# Patient Record
Sex: Female | Born: 1945
Health system: Southern US, Community
[De-identification: ages and names within clinical notes are randomized; demographics above are authoritative.]

## PROBLEM LIST (undated history)

## (undated) DIAGNOSIS — I10 Essential (primary) hypertension: Secondary | ICD-10-CM

## (undated) DIAGNOSIS — K219 Gastro-esophageal reflux disease without esophagitis: Secondary | ICD-10-CM

## (undated) DIAGNOSIS — I639 Cerebral infarction, unspecified: Secondary | ICD-10-CM

## (undated) DIAGNOSIS — J449 Chronic obstructive pulmonary disease, unspecified: Secondary | ICD-10-CM

## (undated) DIAGNOSIS — I509 Heart failure, unspecified: Secondary | ICD-10-CM

## (undated) DIAGNOSIS — E7800 Pure hypercholesterolemia, unspecified: Secondary | ICD-10-CM

## (undated) DIAGNOSIS — F411 Generalized anxiety disorder: Secondary | ICD-10-CM

## (undated) DIAGNOSIS — I2510 Atherosclerotic heart disease of native coronary artery without angina pectoris: Secondary | ICD-10-CM

## (undated) HISTORY — DX: Atherosclerotic heart disease of native coronary artery without angina pectoris: I25.10

## (undated) HISTORY — PX: ABDOMINAL HYSTERECTOMY: SHX81

## (undated) HISTORY — PX: BLADDER SURGERY: SHX569

## (undated) HISTORY — DX: Heart failure, unspecified: I50.9

---

## 1998-12-28 ENCOUNTER — Encounter: Admission: RE | Admit: 1998-12-28 | Discharge: 1999-03-28 | Payer: Self-pay | Admitting: Internal Medicine

## 1999-01-08 ENCOUNTER — Encounter: Admission: RE | Admit: 1999-01-08 | Discharge: 1999-01-08 | Payer: Self-pay | Admitting: Internal Medicine

## 2000-01-08 ENCOUNTER — Encounter: Payer: Self-pay | Admitting: Internal Medicine

## 2000-01-08 ENCOUNTER — Encounter: Admission: RE | Admit: 2000-01-08 | Discharge: 2000-01-08 | Payer: Self-pay | Admitting: Internal Medicine

## 2000-07-17 ENCOUNTER — Encounter: Payer: Self-pay | Admitting: Internal Medicine

## 2000-07-17 ENCOUNTER — Encounter: Admission: RE | Admit: 2000-07-17 | Discharge: 2000-07-17 | Payer: Self-pay | Admitting: Internal Medicine

## 2000-10-28 ENCOUNTER — Encounter: Payer: Self-pay | Admitting: Internal Medicine

## 2000-10-28 ENCOUNTER — Encounter: Admission: RE | Admit: 2000-10-28 | Discharge: 2000-10-28 | Payer: Self-pay | Admitting: Internal Medicine

## 2000-11-17 ENCOUNTER — Encounter: Payer: Self-pay | Admitting: Internal Medicine

## 2000-11-17 ENCOUNTER — Encounter: Admission: RE | Admit: 2000-11-17 | Discharge: 2000-11-17 | Payer: Self-pay | Admitting: Internal Medicine

## 2000-11-20 ENCOUNTER — Observation Stay (HOSPITAL_COMMUNITY): Admission: RE | Admit: 2000-11-20 | Discharge: 2000-11-20 | Payer: Self-pay

## 2000-11-20 ENCOUNTER — Encounter (INDEPENDENT_AMBULATORY_CARE_PROVIDER_SITE_OTHER): Payer: Self-pay | Admitting: Specialist

## 2002-06-01 ENCOUNTER — Encounter: Payer: Self-pay | Admitting: Internal Medicine

## 2002-06-01 ENCOUNTER — Encounter: Admission: RE | Admit: 2002-06-01 | Discharge: 2002-06-01 | Payer: Self-pay | Admitting: Internal Medicine

## 2002-11-21 ENCOUNTER — Emergency Department (HOSPITAL_COMMUNITY): Admission: EM | Admit: 2002-11-21 | Discharge: 2002-11-21 | Payer: Self-pay | Admitting: Emergency Medicine

## 2003-02-22 ENCOUNTER — Encounter: Admission: RE | Admit: 2003-02-22 | Discharge: 2003-02-22 | Payer: Self-pay | Admitting: Internal Medicine

## 2003-06-23 ENCOUNTER — Encounter: Admission: RE | Admit: 2003-06-23 | Discharge: 2003-06-23 | Payer: Self-pay | Admitting: Internal Medicine

## 2004-08-31 ENCOUNTER — Encounter: Admission: RE | Admit: 2004-08-31 | Discharge: 2004-08-31 | Payer: Self-pay | Admitting: Internal Medicine

## 2008-07-30 ENCOUNTER — Emergency Department (HOSPITAL_COMMUNITY): Admission: EM | Admit: 2008-07-30 | Discharge: 2008-07-30 | Payer: Self-pay | Admitting: Emergency Medicine

## 2010-02-25 ENCOUNTER — Emergency Department (HOSPITAL_COMMUNITY)
Admission: EM | Admit: 2010-02-25 | Discharge: 2010-02-25 | Payer: Self-pay | Source: Home / Self Care | Admitting: Emergency Medicine

## 2010-04-14 ENCOUNTER — Encounter: Payer: Self-pay | Admitting: Internal Medicine

## 2010-04-15 ENCOUNTER — Encounter: Payer: Self-pay | Admitting: Internal Medicine

## 2010-04-28 ENCOUNTER — Encounter: Payer: Self-pay | Admitting: Internal Medicine

## 2010-07-03 LAB — URINALYSIS, ROUTINE W REFLEX MICROSCOPIC
Glucose, UA: NEGATIVE mg/dL
Ketones, ur: NEGATIVE mg/dL
pH: 7.5 (ref 5.0–8.0)

## 2010-07-03 LAB — POCT CARDIAC MARKERS
CKMB, poc: <1 ng/mL — ABNORMAL LOW (ref 1.0–8.0)
CKMB, poc: <1 ng/mL — ABNORMAL LOW (ref 1.0–8.0)
Troponin i, poc: <0.05 ng/mL (ref 0.00–0.09)

## 2010-07-03 LAB — CBC
HCT: 40.7 % (ref 36.0–46.0)
MCHC: 34.8 g/dL (ref 30.0–36.0)
MCV: 90 fL (ref 78.0–100.0)
Platelets: 229 10*3/uL (ref 150–400)
RDW: 13.1 % (ref 11.5–15.5)

## 2010-07-03 LAB — DIFFERENTIAL
Eosinophils Absolute: 0.1 10*3/uL (ref 0.0–0.7)
Eosinophils Relative: 2 % (ref 0–5)
Lymphs Abs: 2.4 10*3/uL (ref 0.7–4.0)
Monocytes Relative: 9 % (ref 3–12)
Neutrophils Relative %: 56 % (ref 43–77)

## 2010-07-03 LAB — BASIC METABOLIC PANEL
BUN: 4 mg/dL — ABNORMAL LOW (ref 6–23)
CO2: 27 mEq/L (ref 19–32)
Chloride: 105 mEq/L (ref 96–112)
Glucose, Bld: 113 mg/dL — ABNORMAL HIGH (ref 70–99)
Potassium: 2.7 mEq/L — CL (ref 3.5–5.1)

## 2010-08-10 NOTE — Op Note (Signed)
[facility]  Patient:    Melinda Gomez, Melinda Gomez Visit Number: [id number] MRN: [id number]          Service Type: SUR Location: [id number] Attending Physician:  Meredith Leeds Proc. Date: 11/20/00 Adm. Date:  11/20/2000                             Operative Report  PREOPERATIVE DIAGNOSIS:  Symptomatic gallstones  POSTOPERATIVE DIAGNOSIS:  Symptomatic gallstones.  OPERATION:  Laparoscopic cholecystectomy.  SURGEON:  Zigmund Daniel, M.D.  ASSISTANT:  Rose Phi. Maple Hudson, M.D.  ANESTHESIA:  General.  DESCRIPTION OF PROCEDURE:  After the patient had adequate monitoring and general anesthesia and routine preparation and draping of the abdomen, I made a short transverse infraumbilical incision, incised the fascia in the midline and bluntly entered the peritoneal cavity.  The immediate area was free of adhesions but when I put in the scope after securing Hasson cannula with an 0 Vicryl pursestring suture and achieving pneumoperitoneum, there were obvious adhesions on the right side of the abdomen.  I removed the cannula and swept away some of the adhesions and assured that I had free access to the peritoneal cavity.  I carefully re-placed the scope, assuring myself that there were no bowel injuries and then was able to achieve access to the upper abdomen by looking first toward the left and then swinging it around to the right.  There was good access in the right upper quadrant.  The gallbladder did not appear to be inflamed.  I then placed three additional ports after anesthetizing the sites and retracted the fundus of the gallbladder toward the right shoulder and pulled the infundibulum laterally.  With the patient in the head up, foot down, and left tilted position, there was good visibility of the gallbladder.  I then dissected into the hepatoduodenal ligament until I clearly identified the cystic duct emerging from the infundibulum of  the gallbladder, and I clipped it with four clips and divided it between the two clips which were closest to the gallbladder.  I then dissected out the cystic artery and found at least two branches and similarly clipped and divided the vessels.  I dissected the gallbladder from the liver using cautery and gaining hemostasis as I brought the dissection upward.  Before detaching it, I irrigated the gallbladder fossa and saw that the clips were secure and that there was no bleeding or leakage of bile.  We then removed the irrigant and detached the gallbladder from the liver and removed it through the umbilical incision and tied that pursestring suture.  I then removed the lateral ports under direct vision and after releasing the CO2, removed the epigastric port. I closed all skin incisions with intracuticular 4-0 Vicryl and Steri-Strips. The patient tolerated the operation well.  Sponge, needle, and instrument counts were correct. Attending Physician:  Meredith Leeds DD:  11/20/00 TD:  11/20/00 Job: [id number] [id number]

## 2011-03-14 ENCOUNTER — Other Ambulatory Visit: Payer: Self-pay | Admitting: Internal Medicine

## 2011-03-14 DIAGNOSIS — Z1231 Encounter for screening mammogram for malignant neoplasm of breast: Secondary | ICD-10-CM

## 2011-04-05 ENCOUNTER — Ambulatory Visit: Payer: Self-pay

## 2012-03-24 ENCOUNTER — Other Ambulatory Visit: Payer: Self-pay | Admitting: Internal Medicine

## 2012-03-24 DIAGNOSIS — Z1231 Encounter for screening mammogram for malignant neoplasm of breast: Secondary | ICD-10-CM

## 2012-04-24 ENCOUNTER — Ambulatory Visit: Payer: Self-pay

## 2012-05-21 ENCOUNTER — Ambulatory Visit: Payer: Self-pay

## 2013-07-28 ENCOUNTER — Ambulatory Visit: Payer: Medicare Other | Attending: Physician Assistant

## 2013-07-28 DIAGNOSIS — R498 Other voice and resonance disorders: Secondary | ICD-10-CM | POA: Insufficient documentation

## 2013-07-28 DIAGNOSIS — IMO0001 Reserved for inherently not codable concepts without codable children: Secondary | ICD-10-CM | POA: Insufficient documentation

## 2013-08-02 ENCOUNTER — Ambulatory Visit: Payer: Medicare Other

## 2015-04-06 DIAGNOSIS — E782 Mixed hyperlipidemia: Secondary | ICD-10-CM | POA: Diagnosis not present

## 2015-04-06 DIAGNOSIS — W19XXXA Unspecified fall, initial encounter: Secondary | ICD-10-CM | POA: Diagnosis not present

## 2015-04-06 DIAGNOSIS — I10 Essential (primary) hypertension: Secondary | ICD-10-CM | POA: Diagnosis not present

## 2015-04-06 DIAGNOSIS — K219 Gastro-esophageal reflux disease without esophagitis: Secondary | ICD-10-CM | POA: Diagnosis not present

## 2015-04-06 DIAGNOSIS — Z5181 Encounter for therapeutic drug level monitoring: Secondary | ICD-10-CM | POA: Diagnosis not present

## 2015-04-06 DIAGNOSIS — Z79899 Other long term (current) drug therapy: Secondary | ICD-10-CM | POA: Diagnosis not present

## 2015-04-06 DIAGNOSIS — R7301 Impaired fasting glucose: Secondary | ICD-10-CM | POA: Diagnosis not present

## 2015-04-06 DIAGNOSIS — R251 Tremor, unspecified: Secondary | ICD-10-CM | POA: Diagnosis not present

## 2015-05-11 DIAGNOSIS — F4310 Post-traumatic stress disorder, unspecified: Secondary | ICD-10-CM | POA: Diagnosis not present

## 2015-05-15 DIAGNOSIS — G43909 Migraine, unspecified, not intractable, without status migrainosus: Secondary | ICD-10-CM | POA: Diagnosis not present

## 2015-05-15 DIAGNOSIS — I10 Essential (primary) hypertension: Secondary | ICD-10-CM | POA: Diagnosis not present

## 2015-05-15 DIAGNOSIS — E871 Hypo-osmolality and hyponatremia: Secondary | ICD-10-CM | POA: Diagnosis not present

## 2015-07-10 DIAGNOSIS — E538 Deficiency of other specified B group vitamins: Secondary | ICD-10-CM | POA: Diagnosis not present

## 2015-07-10 DIAGNOSIS — I10 Essential (primary) hypertension: Secondary | ICD-10-CM | POA: Diagnosis not present

## 2015-07-10 DIAGNOSIS — W19XXXA Unspecified fall, initial encounter: Secondary | ICD-10-CM | POA: Diagnosis not present

## 2015-10-31 DIAGNOSIS — I10 Essential (primary) hypertension: Secondary | ICD-10-CM | POA: Diagnosis not present

## 2015-10-31 DIAGNOSIS — K219 Gastro-esophageal reflux disease without esophagitis: Secondary | ICD-10-CM | POA: Diagnosis not present

## 2015-11-13 DIAGNOSIS — F4310 Post-traumatic stress disorder, unspecified: Secondary | ICD-10-CM | POA: Diagnosis not present

## 2016-02-06 DIAGNOSIS — F4310 Post-traumatic stress disorder, unspecified: Secondary | ICD-10-CM | POA: Diagnosis not present

## 2016-03-26 DIAGNOSIS — L708 Other acne: Secondary | ICD-10-CM | POA: Diagnosis not present

## 2016-03-26 DIAGNOSIS — L570 Actinic keratosis: Secondary | ICD-10-CM | POA: Diagnosis not present

## 2016-04-18 ENCOUNTER — Other Ambulatory Visit: Payer: Self-pay | Admitting: Internal Medicine

## 2016-04-18 ENCOUNTER — Ambulatory Visit
Admission: RE | Admit: 2016-04-18 | Discharge: 2016-04-18 | Disposition: A | Discharge status: Home / Self Care | Payer: PPO | Source: Ambulatory Visit | Attending: Internal Medicine | Admitting: Internal Medicine

## 2016-04-18 DIAGNOSIS — F4000 Agoraphobia, unspecified: Secondary | ICD-10-CM | POA: Diagnosis not present

## 2016-04-18 DIAGNOSIS — M25531 Pain in right wrist: Secondary | ICD-10-CM | POA: Diagnosis not present

## 2016-04-18 DIAGNOSIS — E782 Mixed hyperlipidemia: Secondary | ICD-10-CM | POA: Diagnosis not present

## 2016-04-18 DIAGNOSIS — M25532 Pain in left wrist: Secondary | ICD-10-CM | POA: Diagnosis not present

## 2016-04-18 DIAGNOSIS — Z1211 Encounter for screening for malignant neoplasm of colon: Secondary | ICD-10-CM | POA: Diagnosis not present

## 2016-04-18 DIAGNOSIS — Z0000 Encounter for general adult medical examination without abnormal findings: Secondary | ICD-10-CM | POA: Diagnosis not present

## 2016-04-18 DIAGNOSIS — I10 Essential (primary) hypertension: Secondary | ICD-10-CM | POA: Diagnosis not present

## 2016-04-18 DIAGNOSIS — R7301 Impaired fasting glucose: Secondary | ICD-10-CM | POA: Diagnosis not present

## 2016-04-18 DIAGNOSIS — Z1231 Encounter for screening mammogram for malignant neoplasm of breast: Secondary | ICD-10-CM | POA: Diagnosis not present

## 2016-04-18 DIAGNOSIS — F325 Major depressive disorder, single episode, in full remission: Secondary | ICD-10-CM | POA: Diagnosis not present

## 2016-04-18 DIAGNOSIS — M19032 Primary osteoarthritis, left wrist: Secondary | ICD-10-CM | POA: Diagnosis not present

## 2016-04-19 DIAGNOSIS — I10 Essential (primary) hypertension: Secondary | ICD-10-CM | POA: Diagnosis not present

## 2016-04-30 DIAGNOSIS — F4310 Post-traumatic stress disorder, unspecified: Secondary | ICD-10-CM | POA: Diagnosis not present

## 2016-05-28 ENCOUNTER — Ambulatory Visit: Payer: Self-pay

## 2016-06-14 ENCOUNTER — Ambulatory Visit
Admission: RE | Admit: 2016-06-14 | Discharge: 2016-06-14 | Disposition: A | Discharge status: Home / Self Care | Payer: PPO | Source: Ambulatory Visit | Attending: Internal Medicine | Admitting: Internal Medicine

## 2016-06-14 DIAGNOSIS — Z1231 Encounter for screening mammogram for malignant neoplasm of breast: Secondary | ICD-10-CM

## 2016-06-25 DIAGNOSIS — F4310 Post-traumatic stress disorder, unspecified: Secondary | ICD-10-CM | POA: Diagnosis not present

## 2016-07-31 DIAGNOSIS — K5730 Diverticulosis of large intestine without perforation or abscess without bleeding: Secondary | ICD-10-CM | POA: Diagnosis not present

## 2016-07-31 DIAGNOSIS — Z1211 Encounter for screening for malignant neoplasm of colon: Secondary | ICD-10-CM | POA: Diagnosis not present

## 2016-07-31 DIAGNOSIS — K635 Polyp of colon: Secondary | ICD-10-CM | POA: Diagnosis not present

## 2016-07-31 DIAGNOSIS — D126 Benign neoplasm of colon, unspecified: Secondary | ICD-10-CM | POA: Diagnosis not present

## 2016-08-06 DIAGNOSIS — Z1211 Encounter for screening for malignant neoplasm of colon: Secondary | ICD-10-CM | POA: Diagnosis not present

## 2016-08-06 DIAGNOSIS — K635 Polyp of colon: Secondary | ICD-10-CM | POA: Diagnosis not present

## 2016-08-06 DIAGNOSIS — D126 Benign neoplasm of colon, unspecified: Secondary | ICD-10-CM | POA: Diagnosis not present

## 2016-10-01 DIAGNOSIS — F4310 Post-traumatic stress disorder, unspecified: Secondary | ICD-10-CM | POA: Diagnosis not present

## 2016-10-18 DIAGNOSIS — I10 Essential (primary) hypertension: Secondary | ICD-10-CM | POA: Diagnosis not present

## 2016-10-18 DIAGNOSIS — K219 Gastro-esophageal reflux disease without esophagitis: Secondary | ICD-10-CM | POA: Diagnosis not present

## 2017-04-24 DIAGNOSIS — R0609 Other forms of dyspnea: Secondary | ICD-10-CM | POA: Diagnosis not present

## 2017-04-24 DIAGNOSIS — K219 Gastro-esophageal reflux disease without esophagitis: Secondary | ICD-10-CM | POA: Diagnosis not present

## 2017-04-24 DIAGNOSIS — F325 Major depressive disorder, single episode, in full remission: Secondary | ICD-10-CM | POA: Diagnosis not present

## 2017-04-24 DIAGNOSIS — I10 Essential (primary) hypertension: Secondary | ICD-10-CM | POA: Diagnosis not present

## 2017-04-24 DIAGNOSIS — Z1389 Encounter for screening for other disorder: Secondary | ICD-10-CM | POA: Diagnosis not present

## 2017-04-24 DIAGNOSIS — E663 Overweight: Secondary | ICD-10-CM | POA: Diagnosis not present

## 2017-04-24 DIAGNOSIS — E782 Mixed hyperlipidemia: Secondary | ICD-10-CM | POA: Diagnosis not present

## 2017-04-24 DIAGNOSIS — Z0000 Encounter for general adult medical examination without abnormal findings: Secondary | ICD-10-CM | POA: Diagnosis not present

## 2017-04-24 DIAGNOSIS — Z720 Tobacco use: Secondary | ICD-10-CM | POA: Diagnosis not present

## 2017-04-24 DIAGNOSIS — R7301 Impaired fasting glucose: Secondary | ICD-10-CM | POA: Diagnosis not present

## 2017-04-24 DIAGNOSIS — L659 Nonscarring hair loss, unspecified: Secondary | ICD-10-CM | POA: Diagnosis not present

## 2017-04-25 DIAGNOSIS — N179 Acute kidney failure, unspecified: Secondary | ICD-10-CM | POA: Diagnosis not present

## 2017-04-25 DIAGNOSIS — I10 Essential (primary) hypertension: Secondary | ICD-10-CM | POA: Diagnosis not present

## 2017-05-14 DIAGNOSIS — N179 Acute kidney failure, unspecified: Secondary | ICD-10-CM | POA: Diagnosis not present

## 2017-05-19 DIAGNOSIS — F325 Major depressive disorder, single episode, in full remission: Secondary | ICD-10-CM | POA: Diagnosis not present

## 2017-05-19 DIAGNOSIS — E782 Mixed hyperlipidemia: Secondary | ICD-10-CM | POA: Diagnosis not present

## 2017-05-19 DIAGNOSIS — I10 Essential (primary) hypertension: Secondary | ICD-10-CM | POA: Diagnosis not present

## 2017-05-22 DIAGNOSIS — F4310 Post-traumatic stress disorder, unspecified: Secondary | ICD-10-CM | POA: Diagnosis not present

## 2017-06-02 DIAGNOSIS — R0609 Other forms of dyspnea: Secondary | ICD-10-CM | POA: Diagnosis not present

## 2017-08-12 DIAGNOSIS — F4310 Post-traumatic stress disorder, unspecified: Secondary | ICD-10-CM | POA: Diagnosis not present

## 2017-10-16 DIAGNOSIS — F325 Major depressive disorder, single episode, in full remission: Secondary | ICD-10-CM | POA: Diagnosis not present

## 2017-10-16 DIAGNOSIS — I10 Essential (primary) hypertension: Secondary | ICD-10-CM | POA: Diagnosis not present

## 2017-10-16 DIAGNOSIS — J449 Chronic obstructive pulmonary disease, unspecified: Secondary | ICD-10-CM | POA: Diagnosis not present

## 2017-10-16 DIAGNOSIS — E782 Mixed hyperlipidemia: Secondary | ICD-10-CM | POA: Diagnosis not present

## 2017-10-21 DIAGNOSIS — F4310 Post-traumatic stress disorder, unspecified: Secondary | ICD-10-CM | POA: Diagnosis not present

## 2017-10-23 DIAGNOSIS — Z720 Tobacco use: Secondary | ICD-10-CM | POA: Diagnosis not present

## 2017-10-23 DIAGNOSIS — I10 Essential (primary) hypertension: Secondary | ICD-10-CM | POA: Diagnosis not present

## 2017-10-23 DIAGNOSIS — K219 Gastro-esophageal reflux disease without esophagitis: Secondary | ICD-10-CM | POA: Diagnosis not present

## 2017-10-23 DIAGNOSIS — J449 Chronic obstructive pulmonary disease, unspecified: Secondary | ICD-10-CM | POA: Diagnosis not present

## 2017-12-03 DIAGNOSIS — J449 Chronic obstructive pulmonary disease, unspecified: Secondary | ICD-10-CM | POA: Diagnosis not present

## 2017-12-03 DIAGNOSIS — F329 Major depressive disorder, single episode, unspecified: Secondary | ICD-10-CM | POA: Diagnosis not present

## 2017-12-03 DIAGNOSIS — I10 Essential (primary) hypertension: Secondary | ICD-10-CM | POA: Diagnosis not present

## 2017-12-03 DIAGNOSIS — E782 Mixed hyperlipidemia: Secondary | ICD-10-CM | POA: Diagnosis not present

## 2018-01-02 DIAGNOSIS — F4310 Post-traumatic stress disorder, unspecified: Secondary | ICD-10-CM

## 2018-01-02 DIAGNOSIS — F5104 Psychophysiologic insomnia: Secondary | ICD-10-CM

## 2018-01-02 DIAGNOSIS — K219 Gastro-esophageal reflux disease without esophagitis: Secondary | ICD-10-CM

## 2018-01-13 ENCOUNTER — Encounter: Payer: Self-pay | Admitting: Psychiatry

## 2018-01-13 ENCOUNTER — Ambulatory Visit (INDEPENDENT_AMBULATORY_CARE_PROVIDER_SITE_OTHER): Payer: PPO | Admitting: Psychiatry

## 2018-01-13 DIAGNOSIS — F331 Major depressive disorder, recurrent, moderate: Secondary | ICD-10-CM | POA: Diagnosis not present

## 2018-01-13 DIAGNOSIS — F4310 Post-traumatic stress disorder, unspecified: Secondary | ICD-10-CM

## 2018-01-13 DIAGNOSIS — G4720 Circadian rhythm sleep disorder, unspecified type: Secondary | ICD-10-CM

## 2018-01-13 NOTE — Patient Instructions (Signed)
Continue blue blocker glasses. Try to stop daytime napping to help improve nighttime sleep. Get early day light exposure.

## 2018-01-13 NOTE — Progress Notes (Signed)
[facility]  Patient ID: HYDEE FLEECE,  MRN: [id number]  PCP: Patient, No Pcp Per  Date of Evaluation: 01/13/2018 Time spent:30 minutes   HISTORY/CURRENT STATUS: HPI  Seen with H CC: insomnia same as last time.  Tried 10 and 15 mg samples of Belsomra inconsistently.  I don't see a difference except has a hangover.  Still EMA and EFA.  She says rarely sleeps all night.  Frustrated with that.  Go to bed 9 PM. Avg 5 hrs by her report and tired daytime.  Will nap daytime bc poor sleep at night.  H thinks she makes up for insomnia at HS by naps.  Few NM. Patient reports stable mood and denies depressed or irritable moods.  Patient denies any recent difficulty with anxiety.   Denies appetite disturbance.  Patient reports that energy and motivation have been low.  Patient denies any difficulty with concentration.  Patient denies any suicidal ideation . Individual Medical History/ Review of Systems: Changes? :No  Sleeping medications that have failed include mirtazapine, hydroxyzine, trazodone.  Inconsistent trial of Belsomra.  Allergies: Talwin [pentazocine]  Current Medications:  Current Outpatient Medications:  .  ALPRAZolam (XANAX) 0.5 MG tablet, Take 0.5 mg by mouth 3 (three) times daily as needed for anxiety (take 1.5 mg as needed for anxiety)., Disp: , Rfl:  .  hydrOXYzine (ATARAX/VISTARIL) 25 MG tablet, Take 25 mg by mouth at bedtime., Disp: , Rfl:  .  risperiDONE (RISPERDAL) 1 MG tablet, Take 1 tablet by mouth 2 (two) times daily as needed., Disp: , Rfl:  .  traZODone (DESYREL) 50 MG tablet, Take 50 mg by mouth at bedtime as needed for sleep (take 1-2 at bedtime as needed)., Disp: , Rfl:  .  venlafaxine XR (EFFEXOR-XR) 75 MG 24 hr capsule, Take 75 mg by mouth daily. Take 1 (75 mg) daily & 2 (150 mg) at bedtime. 225 mg total, Disp: , Rfl:  Medication Side Effects: None  Family Medical/ Social History: Changes? No  MENTAL HEALTH EXAM:  There were no vitals taken  for this visit.There is no height or weight on file to calculate BMI.  General Appearance: Casual  Eye Contact:  Good  Speech:  Slow  Volume:  Normal  Mood:  Dysphoric  Affect:  Appropriate and mild irritabilty  Thought Process:  Coherent  Orientation:  Full (Time, Place, and Person)  Thought Content: Logical   Suicidal Thoughts:  No  Homicidal Thoughts:  No  Memory:  Recent  Judgement:  Fair  Insight:  Fair  Psychomotor Activity:  Decreased  Concentration:  Concentration: Fair  Recall:  Good  Fund of Knowledge: Fair  Language: Good  Akathisia:  No  AIMS (if indicated): not done  Assets:  Financial Resources/Insurance Housing Social Support Transportation  ADL's:  Intact  Cognition: WNL  Prognosis:  Fair    DIAGNOSES:    ICD-10-CM   1. Disturbed sleep rhythm G47.20   2. PTSD (post-traumatic stress disorder) F43.10   3. Major depressive disorder, recurrent episode, moderate (HCC) F33.1     RECOMMENDATIONS:   Greater than 50% of face to face time with patient was spent on counseling and coordination of care. We discussed sleep hygiene and esp sleep restriction.  We discussed the fact that we have used the safest sleep meds at this time.  There is cognitive risks if we continue to escalate them or continue to combine more sleep meds together.  Were not going to achieve success at consolidating sleep at night  if she naps significantly during the day. Don't take sleep meds if NR. Continue blue blocker glasses. Try to stop daytime napping to help improve nighttime sleep. Get early day light exposure. The alternative of Seroquel raises additional side effect concerns and we will not pursue that at this time.  She could consider a longer trial of Belsomra at 10 or 15 mg but she has problems with consistency on this.   Purnell Shoemaker, MD

## 2018-01-19 DIAGNOSIS — M79651 Pain in right thigh: Secondary | ICD-10-CM | POA: Diagnosis not present

## 2018-02-02 ENCOUNTER — Other Ambulatory Visit: Payer: Self-pay | Admitting: Psychiatry

## 2018-02-02 NOTE — Telephone Encounter (Signed)
Clarify dose with paper chart

## 2018-02-03 DIAGNOSIS — M25551 Pain in right hip: Secondary | ICD-10-CM | POA: Diagnosis not present

## 2018-02-13 ENCOUNTER — Other Ambulatory Visit: Payer: Self-pay | Admitting: Psychiatry

## 2018-03-02 ENCOUNTER — Other Ambulatory Visit: Payer: Self-pay

## 2018-03-02 DIAGNOSIS — E782 Mixed hyperlipidemia: Secondary | ICD-10-CM | POA: Diagnosis not present

## 2018-03-02 DIAGNOSIS — J449 Chronic obstructive pulmonary disease, unspecified: Secondary | ICD-10-CM | POA: Diagnosis not present

## 2018-03-02 DIAGNOSIS — F325 Major depressive disorder, single episode, in full remission: Secondary | ICD-10-CM | POA: Diagnosis not present

## 2018-03-02 DIAGNOSIS — I10 Essential (primary) hypertension: Secondary | ICD-10-CM | POA: Diagnosis not present

## 2018-03-02 DIAGNOSIS — F329 Major depressive disorder, single episode, unspecified: Secondary | ICD-10-CM | POA: Diagnosis not present

## 2018-03-02 MED ORDER — ALPRAZOLAM 0.5 MG PO TABS: 0.5000 mg | ORAL_TABLET | Freq: Three times a day (TID) | ORAL | 5 refills | Status: DC | PRN

## 2018-04-28 ENCOUNTER — Other Ambulatory Visit: Payer: Self-pay | Admitting: Internal Medicine

## 2018-04-28 DIAGNOSIS — G4700 Insomnia, unspecified: Secondary | ICD-10-CM | POA: Diagnosis not present

## 2018-04-28 DIAGNOSIS — Z720 Tobacco use: Secondary | ICD-10-CM | POA: Diagnosis not present

## 2018-04-28 DIAGNOSIS — Z1231 Encounter for screening mammogram for malignant neoplasm of breast: Secondary | ICD-10-CM

## 2018-04-28 DIAGNOSIS — R7301 Impaired fasting glucose: Secondary | ICD-10-CM | POA: Diagnosis not present

## 2018-04-28 DIAGNOSIS — F325 Major depressive disorder, single episode, in full remission: Secondary | ICD-10-CM | POA: Diagnosis not present

## 2018-04-28 DIAGNOSIS — Z0000 Encounter for general adult medical examination without abnormal findings: Secondary | ICD-10-CM | POA: Diagnosis not present

## 2018-04-28 DIAGNOSIS — E782 Mixed hyperlipidemia: Secondary | ICD-10-CM | POA: Diagnosis not present

## 2018-04-28 DIAGNOSIS — R911 Solitary pulmonary nodule: Secondary | ICD-10-CM | POA: Diagnosis not present

## 2018-04-28 DIAGNOSIS — Z1389 Encounter for screening for other disorder: Secondary | ICD-10-CM | POA: Diagnosis not present

## 2018-04-28 DIAGNOSIS — I10 Essential (primary) hypertension: Secondary | ICD-10-CM | POA: Diagnosis not present

## 2018-05-22 DIAGNOSIS — F329 Major depressive disorder, single episode, unspecified: Secondary | ICD-10-CM | POA: Diagnosis not present

## 2018-05-22 DIAGNOSIS — I10 Essential (primary) hypertension: Secondary | ICD-10-CM | POA: Diagnosis not present

## 2018-05-22 DIAGNOSIS — E782 Mixed hyperlipidemia: Secondary | ICD-10-CM | POA: Diagnosis not present

## 2018-05-22 DIAGNOSIS — J449 Chronic obstructive pulmonary disease, unspecified: Secondary | ICD-10-CM | POA: Diagnosis not present

## 2018-05-22 DIAGNOSIS — F325 Major depressive disorder, single episode, in full remission: Secondary | ICD-10-CM | POA: Diagnosis not present

## 2018-05-26 ENCOUNTER — Ambulatory Visit
Admission: RE | Admit: 2018-05-26 | Discharge: 2018-05-26 | Disposition: A | Discharge status: Home / Self Care | Payer: PRIVATE HEALTH INSURANCE | Source: Ambulatory Visit | Attending: Internal Medicine | Admitting: Internal Medicine

## 2018-05-26 DIAGNOSIS — Z1231 Encounter for screening mammogram for malignant neoplasm of breast: Secondary | ICD-10-CM

## 2018-06-08 ENCOUNTER — Other Ambulatory Visit: Payer: Self-pay | Admitting: Psychiatry

## 2018-06-08 NOTE — Telephone Encounter (Signed)
Need to review paper chart  

## 2018-07-15 ENCOUNTER — Other Ambulatory Visit: Payer: Self-pay

## 2018-07-15 ENCOUNTER — Ambulatory Visit (INDEPENDENT_AMBULATORY_CARE_PROVIDER_SITE_OTHER): Payer: PPO | Admitting: Psychiatry

## 2018-07-15 ENCOUNTER — Encounter: Payer: Self-pay | Admitting: Psychiatry

## 2018-07-15 DIAGNOSIS — F331 Major depressive disorder, recurrent, moderate: Secondary | ICD-10-CM | POA: Diagnosis not present

## 2018-07-15 DIAGNOSIS — F5105 Insomnia due to other mental disorder: Secondary | ICD-10-CM | POA: Diagnosis not present

## 2018-07-15 DIAGNOSIS — F4310 Post-traumatic stress disorder, unspecified: Secondary | ICD-10-CM | POA: Diagnosis not present

## 2018-07-15 NOTE — Progress Notes (Signed)
MADDUX FIRST [id number] [birth year] 73 y.o.  Subjective:   Patient ID:  Melinda Gomez is a 73 y.o. (DOB [birth year]) female.  Virtual Visit via Telephone Note  I connected with pt by telephone and verified that I am speaking with the correct person using two identifiers.   I discussed the limitations, risks, security and privacy concerns of performing an evaluation and management service by telephone and the availability of in person appointments. I also discussed with the patient that there may be a patient responsible charge related to this service. The patient expressed understanding and agreed to proceed.  I discussed the assessment and treatment plan with the patient. The patient was provided an opportunity to ask questions and all were answered. The patient agreed with the plan and demonstrated an understanding of the instructions.   The patient was advised to call back or seek an in-person evaluation if the symptoms worsen or if the condition fails to improve as anticipated.  I provided 21 minutes of non-face-to-face time during this encounter. The call started 1210 and ended at 1231. The patient was located at home and the provider was located at office.   Chief Complaint:  Chief Complaint  Patient presents with  . Follow-up    Medication Management  . Other    Not sleeping longer than 3 hours at a time at night    HPI Melinda Gomez is followed up for chronic depression and anxiety and insomnia.  Last visit was January 13, 2018 .  Discouraged excessive use of sleep meds.  Still not sleeping good.  EMA after 2-3 hours once weekly.  Usual is 4 hours sleep HS.  No naps usually.  At night takes trazodone 150, hydroxyzine 25, no alprazolam at night.  Not drowsy.  Calm and [name] agrees.  Usual Xanax prn anxiety not set time.  Not taken daily.  Otherwise fine without depression, anxiety and anger.  Patient reports stable mood and denies depressed or irritable moods.  Patient  denies any recent difficulty with anxiety.  Patient denies difficulty with sleep initiation but poor maintenance. Denies appetite disturbance.  Patient reports that energy and motivation have been good.  Patient denies any difficulty with concentration.  Patient denies any suicidal ideation.  Past Psychiatric Medication Trials: Trazodone up to 150, alprazolam, hydroxyzine, mirtazapine 7.5 jerks, risperidone, venlafaxine, buspirone, Paxil 60 to 80 mg, Wellbutrin side effects, Prozac, Belsomra hangover.  Review of Systems:  Review of Systems  Neurological: Positive for weakness. Negative for tremors.       Recent 2 falls at night.  Not always at night.  Has talked to Doctor about falls but no known diagnosis for it.  Not dizzy before falls. Can't get herself up when she falls.  Medications: I have reviewed the patient's current medications.  Current Outpatient Medications  Medication Sig Dispense Refill  . ALPRAZolam (XANAX) 0.5 MG tablet Take 1 tablet (0.5 mg total) by mouth 3 (three) times daily as needed for anxiety (take 1.5 mg as needed for anxiety). 90 tablet 5  . amLODipine (NORVASC) 5 MG tablet     . ANORO ELLIPTA 62.5-25 MCG/INH AEPB     . esomeprazole (NEXIUM) 40 MG capsule     . HYDROcodone-acetaminophen (NORCO/VICODIN) 5-325 MG tablet     . hydrOXYzine (ATARAX/VISTARIL) 25 MG tablet Take 25 mg by mouth at bedtime.    Marland Kitchen losartan (COZAAR) 100 MG tablet     . meloxicam (MOBIC) 15 MG tablet     .  risperiDONE (RISPERDAL) 1 MG tablet TAKE 1/2 TABLET IN THE MORNING AND 1&1/2 TABLETS AT BEDTIME FOR SLEEPAND MOOD. (Patient taking differently: 1 mg at bedtime. ) 60 tablet 1  . rosuvastatin (CRESTOR) 20 MG tablet     . traZODone (DESYREL) 50 MG tablet TAKE 2 TO 3 TABLETS AT BEDTIME AS NEEDED FOR SLEEP. 90 tablet 5  . venlafaxine XR (EFFEXOR-XR) 75 MG 24 hr capsule TAKE 1 CAPSULE DURING THE DAY AND 2 CAPSULES AT BEDTIME FOR DEPRESSION. 270 capsule 1   No current facility-administered  medications for this visit.     Medication Side Effects: None  No evidence for falls related to meds.  No pattern to falls. Allergies:  Allergies  Allergen Reactions  . Talwin [Pentazocine] Other (See Comments)    hallucinations    History reviewed. No pertinent past medical history.  History reviewed. No pertinent family history.  Social History   Socioeconomic History  . Marital status: Married    Spouse name: Not on file  . Number of children: Not on file  . Years of education: Not on file  . Highest education level: Not on file  Occupational History  . Not on file  Social Needs  . Financial resource strain: Not on file  . Food insecurity:    Worry: Not on file    Inability: Not on file  . Transportation needs:    Medical: Not on file    Non-medical: Not on file  Tobacco Use  . Smoking status: Current Every Day Smoker    Types: Cigarettes  . Smokeless tobacco: Never Used  Substance and Sexual Activity  . Alcohol use: Not on file  . Drug use: Not on file  . Sexual activity: Not on file  Lifestyle  . Physical activity:    Days per week: Not on file    Minutes per session: Not on file  . Stress: Not on file  Relationships  . Social connections:    Talks on phone: Not on file    Gets together: Not on file    Attends religious service: Not on file    Active member of club or organization: Not on file    Attends meetings of clubs or organizations: Not on file    Relationship status: Not on file  . Intimate partner violence:    Fear of current or ex partner: Not on file    Emotionally abused: Not on file    Physically abused: Not on file    Forced sexual activity: Not on file  Other Topics Concern  . Not on file  Social History Narrative  . Not on file    Past Medical History, Surgical history, Social history, and Family history were reviewed and updated as appropriate.   Please see review of systems for further details on the patient's review from  today.   Objective:   Physical Exam:  There were no vitals taken for this visit.  Physical Exam Neurological:     Mental Status: She is alert and oriented to person, place, and time.     Cranial Nerves: No dysarthria.  Psychiatric:        Attention and Perception: Attention normal.        Mood and Affect: Mood normal.        Speech: Speech normal.        Behavior: Behavior is cooperative.        Thought Content: Thought content normal. Thought content is not paranoid or delusional. Thought  content does not include homicidal or suicidal ideation. Thought content does not include homicidal or suicidal plan.        Cognition and Memory: Cognition and memory normal.     Comments: Insight and judgment fair.     Lab Review:     Component Value Date/Time   NA 141 07/30/2008 0945   K (LL) 07/30/2008 0945    2.7 REPEATED TO VERIFY CRITICAL RESULT CALLED TO, READ BACK BY AND VERIFIED WITH: PATE K,RN 1115 07/30/08 SCALES H   CL 105 07/30/2008 0945   CO2 27 07/30/2008 0945   GLUCOSE 113 (H) 07/30/2008 0945   BUN 4 (L) 07/30/2008 0945   CREATININE 0.69 07/30/2008 0945   CALCIUM 8.9 07/30/2008 0945   GFRNONAA >60 07/30/2008 0945   GFRAA  07/30/2008 0945    >60        The eGFR has been calculated using the MDRD equation. This calculation has not been validated in all clinical situations. eGFR's persistently <60 mL/min signify possible Chronic Kidney Disease.       Component Value Date/Time   WBC 7.1 07/30/2008 0945   RBC 4.52 07/30/2008 0945   HGB 14.2 07/30/2008 0945   HCT 40.7 07/30/2008 0945   PLT 229 07/30/2008 0945   MCV 90.0 07/30/2008 0945   MCHC 34.8 07/30/2008 0945   RDW 13.1 07/30/2008 0945   LYMPHSABS 2.4 07/30/2008 0945   MONOABS 0.6 07/30/2008 0945   EOSABS 0.1 07/30/2008 0945   BASOSABS 0.0 07/30/2008 0945    No results found for: POCLITH, LITHIUM   No results found for: PHENYTOIN, PHENOBARB, VALPROATE, CBMZ   .res Assessment: Plan:    Insomnia due  to mental condition  PTSD (post-traumatic stress disorder)  Major depressive disorder, recurrent episode, moderate (HCC)   Sleep hygiene in detail.  She is not getting enough sleep but she does not report being drowsy during the day.  We could consider more aggressive medications for sleep.  We will avoid benzodiazepines at night for sleep because she has a high risk of tolerance with those.  She gets some initial benefit from the current medications.  We discussed the fall risk related to any sedative medication.  It does not appear that her falling is related to the medication.  Option Seroquel for TR insomnia but greater SE risk.  She prefers to avoid this after discussing the side effects.  Overall her depression and anxiety associated with PTSD are fairly managed.  She has a fairly restricted life which is how in part she manages her anxiety but it does not contribute to depression so she satisfied.  She does not want medicine change today.  No medicine changes today  Follow-up 6 months  Lynder Parents, MD, DFAPA    Please see After Visit Summary for patient specific instructions.  No future appointments.  No orders of the defined types were placed in this encounter.     -------------------------------

## 2018-08-13 ENCOUNTER — Other Ambulatory Visit: Payer: Self-pay | Admitting: Psychiatry

## 2018-08-18 ENCOUNTER — Telehealth: Payer: Self-pay | Admitting: Psychiatry

## 2018-08-18 ENCOUNTER — Other Ambulatory Visit: Payer: Self-pay | Admitting: Psychiatry

## 2018-08-18 NOTE — Telephone Encounter (Signed)
This was submitted 05/22 with 5 refills

## 2018-08-18 NOTE — Telephone Encounter (Signed)
Patient need refill on Trazadone 50 mg tabs to be sent to [facility] in [place]

## 2018-09-17 ENCOUNTER — Other Ambulatory Visit: Payer: Self-pay | Admitting: Psychiatry

## 2018-10-23 DIAGNOSIS — F329 Major depressive disorder, single episode, unspecified: Secondary | ICD-10-CM | POA: Diagnosis not present

## 2018-10-23 DIAGNOSIS — I10 Essential (primary) hypertension: Secondary | ICD-10-CM | POA: Diagnosis not present

## 2018-10-23 DIAGNOSIS — F325 Major depressive disorder, single episode, in full remission: Secondary | ICD-10-CM | POA: Diagnosis not present

## 2018-10-23 DIAGNOSIS — J449 Chronic obstructive pulmonary disease, unspecified: Secondary | ICD-10-CM | POA: Diagnosis not present

## 2018-10-23 DIAGNOSIS — E782 Mixed hyperlipidemia: Secondary | ICD-10-CM | POA: Diagnosis not present

## 2018-10-26 DIAGNOSIS — I10 Essential (primary) hypertension: Secondary | ICD-10-CM | POA: Diagnosis not present

## 2018-10-26 DIAGNOSIS — K219 Gastro-esophageal reflux disease without esophagitis: Secondary | ICD-10-CM | POA: Diagnosis not present

## 2018-10-26 DIAGNOSIS — J449 Chronic obstructive pulmonary disease, unspecified: Secondary | ICD-10-CM | POA: Diagnosis not present

## 2018-10-26 DIAGNOSIS — G4700 Insomnia, unspecified: Secondary | ICD-10-CM | POA: Diagnosis not present

## 2018-10-26 DIAGNOSIS — Z720 Tobacco use: Secondary | ICD-10-CM | POA: Diagnosis not present

## 2018-11-02 ENCOUNTER — Other Ambulatory Visit: Payer: Self-pay | Admitting: Psychiatry

## 2018-12-22 ENCOUNTER — Other Ambulatory Visit: Payer: Self-pay | Admitting: Psychiatry

## 2019-01-06 DIAGNOSIS — L570 Actinic keratosis: Secondary | ICD-10-CM | POA: Diagnosis not present

## 2019-01-06 DIAGNOSIS — L819 Disorder of pigmentation, unspecified: Secondary | ICD-10-CM | POA: Diagnosis not present

## 2019-01-08 DIAGNOSIS — F329 Major depressive disorder, single episode, unspecified: Secondary | ICD-10-CM | POA: Diagnosis not present

## 2019-01-08 DIAGNOSIS — J449 Chronic obstructive pulmonary disease, unspecified: Secondary | ICD-10-CM | POA: Diagnosis not present

## 2019-01-08 DIAGNOSIS — E782 Mixed hyperlipidemia: Secondary | ICD-10-CM | POA: Diagnosis not present

## 2019-01-08 DIAGNOSIS — F325 Major depressive disorder, single episode, in full remission: Secondary | ICD-10-CM | POA: Diagnosis not present

## 2019-01-08 DIAGNOSIS — I10 Essential (primary) hypertension: Secondary | ICD-10-CM | POA: Diagnosis not present

## 2019-01-14 ENCOUNTER — Ambulatory Visit (INDEPENDENT_AMBULATORY_CARE_PROVIDER_SITE_OTHER): Payer: PPO | Admitting: Psychiatry

## 2019-01-14 ENCOUNTER — Encounter: Payer: Self-pay | Admitting: Psychiatry

## 2019-01-14 ENCOUNTER — Other Ambulatory Visit: Payer: Self-pay

## 2019-01-14 DIAGNOSIS — F4310 Post-traumatic stress disorder, unspecified: Secondary | ICD-10-CM

## 2019-01-14 DIAGNOSIS — F5105 Insomnia due to other mental disorder: Secondary | ICD-10-CM | POA: Diagnosis not present

## 2019-01-14 DIAGNOSIS — G4720 Circadian rhythm sleep disorder, unspecified type: Secondary | ICD-10-CM | POA: Diagnosis not present

## 2019-01-14 DIAGNOSIS — F331 Major depressive disorder, recurrent, moderate: Secondary | ICD-10-CM | POA: Diagnosis not present

## 2019-01-14 NOTE — Progress Notes (Signed)
Melinda Gomez [id number] 09/12/1945 73 y.o.  Subjective:   Patient ID:  Melinda Gomez is a 73 y.o. (DOB June 06, 1945) female.  Virtual Visit via Telephone Note  I connected with pt by telephone and verified that I am speaking with the correct person using two identifiers.   I discussed the limitations, risks, security and privacy concerns of performing an evaluation and management service by telephone and the availability of in person appointments. I also discussed with the patient that there may be a patient responsible charge related to this service. The patient expressed understanding and agreed to proceed.  I discussed the assessment and treatment plan with the patient. The patient was provided an opportunity to ask questions and all were answered. The patient agreed with the plan and demonstrated an understanding of the instructions.   The patient was advised to call back or seek an in-person evaluation if the symptoms worsen or if the condition fails to improve as anticipated.  I provided 15 minutes of non-face-to-face time during this encounter. The call started 1140 and ended at 1155. The patient was located at home and the provider was located at office.   Chief Complaint:  Chief Complaint  Patient presents with  . Follow-up    Medication Management  . Other    PTSD    HPI Melinda Gomez is followed up for chronic depression and anxiety and insomnia.  Last visit was July 15, 2018.  She was still dealing with chronic insomnia.  We discussed potential med changes but none were made per her request.  Sleeping habits have changed.  Last couple days EMA but overall is better and getting 6-7 hours.  She's not sure why.  It just changed itself.  Would like to sleep longer  No naps usually.  At night takes trazodone 150, hydroxyzine 25, no alprazolam at night.  Not drowsy.  Calm and [name] agrees.  Overall is quite well.  Concerned about wide spread hair loss on her body.  Still  has hair on her head.  Has seen PCP about it.  Has not had labs.    Usual Xanax prn anxiety not set time.  Not taken daily.  Otherwise fine without depression, anxiety and anger. Mood seems fine even when doesn't get enough sleep.  Patient reports stable mood and denies depressed or irritable moods.  Patient denies any recent difficulty with anxiety.  Patient denies difficulty with sleep initiation but poor maintenance. Denies appetite disturbance.  Eating fine.  Patient reports that energy and motivation have been good.  Patient denies any difficulty with concentration.  Patient denies any suicidal ideation.  Past Psychiatric Medication Trials: Trazodone up to 150, alprazolam, hydroxyzine, mirtazapine 7.5 jerks, risperidone, venlafaxine, buspirone, Paxil 60 to 80 mg, Wellbutrin side effects, Prozac, Belsomra hangover.  Review of Systems:  Review of Systems  Neurological: Positive for weakness. Negative for tremors.       Recent 2 falls at night.  Not always at night.  Has talked to Doctor about falls but no known diagnosis for it.  Not dizzy before falls. Can't get herself up when she falls.  Medications: I have reviewed the patient's current medications.  Current Outpatient Medications  Medication Sig Dispense Refill  . albuterol (VENTOLIN HFA) 108 (90 Base) MCG/ACT inhaler     . ALPRAZolam (XANAX) 0.5 MG tablet TAKE 1 TABLET BY MOUTH 3 TIMES A DAY AS NEEDED FOR ANXIETY. 90 tablet 2  . amLODipine (NORVASC) 5 MG tablet     .  ANORO ELLIPTA 62.5-25 MCG/INH AEPB     . diphenhydrAMINE (BENADRYL) 25 mg capsule Take 25 mg by mouth every 6 (six) hours as needed.    Marland Kitchen esomeprazole (NEXIUM) 40 MG capsule     . hydrOXYzine (ATARAX/VISTARIL) 25 MG tablet Take 25 mg by mouth at bedtime.    Marland Kitchen losartan (COZAAR) 100 MG tablet     . risperiDONE (RISPERDAL) 1 MG tablet Take 1 tablet (1 mg total) by mouth at bedtime. 30 tablet 2  . rosuvastatin (CRESTOR) 20 MG tablet     . traZODone (DESYREL) 50 MG  tablet TAKE 2 TO 3 TABLETS AT BEDTIME AS NEEDED FOR SLEEP. 90 tablet 0  . venlafaxine XR (EFFEXOR-XR) 75 MG 24 hr capsule TAKE 1 CAPSULE DURING THE DAY AND 2 CAPSULES AT BEDTIME FOR DEPRESSION. 270 capsule 0  . HYDROcodone-acetaminophen (NORCO/VICODIN) 5-325 MG tablet      No current facility-administered medications for this visit.     Medication Side Effects: None  No evidence for falls related to meds.  No pattern to falls. Allergies:  Allergies  Allergen Reactions  . Talwin [Pentazocine] Other (See Comments)    hallucinations    History reviewed. No pertinent past medical history.  History reviewed. No pertinent family history.  Social History   Socioeconomic History  . Marital status: Married    Spouse name: Not on file  . Number of children: Not on file  . Years of education: Not on file  . Highest education level: Not on file  Occupational History  . Not on file  Social Needs  . Financial resource strain: Not on file  . Food insecurity    Worry: Not on file    Inability: Not on file  . Transportation needs    Medical: Not on file    Non-medical: Not on file  Tobacco Use  . Smoking status: Current Every Day Smoker    Types: Cigarettes  . Smokeless tobacco: Never Used  Substance and Sexual Activity  . Alcohol use: Not on file  . Drug use: Not on file  . Sexual activity: Not on file  Lifestyle  . Physical activity    Days per week: Not on file    Minutes per session: Not on file  . Stress: Not on file  Relationships  . Social Herbalist on phone: Not on file    Gets together: Not on file    Attends religious service: Not on file    Active member of club or organization: Not on file    Attends meetings of clubs or organizations: Not on file    Relationship status: Not on file  . Intimate partner violence    Fear of current or ex partner: Not on file    Emotionally abused: Not on file    Physically abused: Not on file    Forced sexual  activity: Not on file  Other Topics Concern  . Not on file  Social History Narrative  . Not on file    Past Medical History, Surgical history, Social history, and Family history were reviewed and updated as appropriate.   Please see review of systems for further details on the patient's review from today.   Objective:   Physical Exam:  There were no vitals taken for this visit.  Physical Exam Neurological:     Mental Status: She is alert and oriented to person, place, and time.     Cranial Nerves: No dysarthria.  Psychiatric:  Attention and Perception: Attention normal.        Mood and Affect: Mood normal.        Speech: Speech normal.        Behavior: Behavior is cooperative.        Thought Content: Thought content normal. Thought content is not paranoid or delusional. Thought content does not include homicidal or suicidal ideation. Thought content does not include homicidal or suicidal plan.        Cognition and Memory: Cognition and memory normal.     Comments: Insight and judgment fair.     Lab Review:     Component Value Date/Time   NA 141 07/30/2008 0945   K (LL) 07/30/2008 0945    2.7 REPEATED TO VERIFY CRITICAL RESULT CALLED TO, READ BACK BY AND VERIFIED WITH: PATE K,RN 1115 07/30/08 SCALES H   CL 105 07/30/2008 0945   CO2 27 07/30/2008 0945   GLUCOSE 113 (H) 07/30/2008 0945   BUN 4 (L) 07/30/2008 0945   CREATININE 0.69 07/30/2008 0945   CALCIUM 8.9 07/30/2008 0945   GFRNONAA >60 07/30/2008 0945   GFRAA  07/30/2008 0945    >60        The eGFR has been calculated using the MDRD equation. This calculation has not been validated in all clinical situations. eGFR's persistently <60 mL/min signify possible Chronic Kidney Disease.       Component Value Date/Time   WBC 7.1 07/30/2008 0945   RBC 4.52 07/30/2008 0945   HGB 14.2 07/30/2008 0945   HCT 40.7 07/30/2008 0945   PLT 229 07/30/2008 0945   MCV 90.0 07/30/2008 0945   MCHC 34.8 07/30/2008 0945    RDW 13.1 07/30/2008 0945   LYMPHSABS 2.4 07/30/2008 0945   MONOABS 0.6 07/30/2008 0945   EOSABS 0.1 07/30/2008 0945   BASOSABS 0.0 07/30/2008 0945    No results found for: POCLITH, LITHIUM   No results found for: PHENYTOIN, PHENOBARB, VALPROATE, CBMZ   .res Assessment: Plan:    Insomnia due to mental condition  Disturbed sleep rhythm  PTSD (post-traumatic stress disorder)  Major depressive disorder, recurrent episode, moderate (HCC)   Sleep hygiene in detail.  Lately her sleep has been a little better than usual but it still highly variable.  She does appear to be getting a greater quantity of sleep than last visit which is positive for her overall mental health..  We will avoid benzodiazepines at night for sleep because she has a high risk of tolerance with those.  She gets some initial benefit from the current medications.  We discussed the fall risk related to any sedative medication.  She has mentioned to following in the past but did not mention any following today.  Option Seroquel for TR insomnia but greater SE risk.  Defer this  Overall her depression and anxiety associated with PTSD are fairly managed.  She has a long psychiatric history but in general has been more stable in the last few years except for the chronic insomnia complaints.  She has a fairly restricted life which is how in part she manages her anxiety but it does not contribute to depression so she satisfied.  She does not want medicine change today.  Discussed her hair loss.  None of the current psychiatric medications that she is taking are typically associated with hair loss.  It is unlikely that they are related.  Answered questions about this.  Talk to dermatologist or PCP to check for thyroid and vitamin deficiencies.  By her report she has not had any laboratory work-up for the hair loss.  She's taking MVI and biotin.  No medicine changes today  Follow-up 6 months  Lynder Parents, MD,  DF[facility]    Please see After Visit Summary for patient specific instructions.  No future appointments.  No orders of the defined types were placed in this encounter.     -------------------------------

## 2019-01-21 ENCOUNTER — Other Ambulatory Visit: Payer: Self-pay | Admitting: Psychiatry

## 2019-01-28 DIAGNOSIS — L659 Nonscarring hair loss, unspecified: Secondary | ICD-10-CM | POA: Diagnosis not present

## 2019-02-01 ENCOUNTER — Other Ambulatory Visit: Payer: Self-pay | Admitting: Psychiatry

## 2019-02-15 ENCOUNTER — Other Ambulatory Visit: Payer: Self-pay | Admitting: Psychiatry

## 2019-03-15 DIAGNOSIS — E782 Mixed hyperlipidemia: Secondary | ICD-10-CM | POA: Diagnosis not present

## 2019-03-15 DIAGNOSIS — J449 Chronic obstructive pulmonary disease, unspecified: Secondary | ICD-10-CM | POA: Diagnosis not present

## 2019-03-15 DIAGNOSIS — I10 Essential (primary) hypertension: Secondary | ICD-10-CM | POA: Diagnosis not present

## 2019-03-15 DIAGNOSIS — F325 Major depressive disorder, single episode, in full remission: Secondary | ICD-10-CM | POA: Diagnosis not present

## 2019-03-15 DIAGNOSIS — F329 Major depressive disorder, single episode, unspecified: Secondary | ICD-10-CM | POA: Diagnosis not present

## 2019-04-07 ENCOUNTER — Other Ambulatory Visit: Payer: Self-pay | Admitting: Psychiatry

## 2019-04-10 NOTE — Telephone Encounter (Signed)
Refills? Scheduled 04/22

## 2019-05-03 DIAGNOSIS — L578 Other skin changes due to chronic exposure to nonionizing radiation: Secondary | ICD-10-CM | POA: Diagnosis not present

## 2019-05-03 DIAGNOSIS — L720 Epidermal cyst: Secondary | ICD-10-CM | POA: Diagnosis not present

## 2019-05-04 DIAGNOSIS — J449 Chronic obstructive pulmonary disease, unspecified: Secondary | ICD-10-CM | POA: Diagnosis not present

## 2019-05-04 DIAGNOSIS — I10 Essential (primary) hypertension: Secondary | ICD-10-CM | POA: Diagnosis not present

## 2019-05-04 DIAGNOSIS — Z720 Tobacco use: Secondary | ICD-10-CM | POA: Diagnosis not present

## 2019-05-04 DIAGNOSIS — K219 Gastro-esophageal reflux disease without esophagitis: Secondary | ICD-10-CM | POA: Diagnosis not present

## 2019-05-04 DIAGNOSIS — Z0000 Encounter for general adult medical examination without abnormal findings: Secondary | ICD-10-CM | POA: Diagnosis not present

## 2019-05-04 DIAGNOSIS — F325 Major depressive disorder, single episode, in full remission: Secondary | ICD-10-CM | POA: Diagnosis not present

## 2019-05-04 DIAGNOSIS — Z1389 Encounter for screening for other disorder: Secondary | ICD-10-CM | POA: Diagnosis not present

## 2019-05-04 DIAGNOSIS — E782 Mixed hyperlipidemia: Secondary | ICD-10-CM | POA: Diagnosis not present

## 2019-05-04 DIAGNOSIS — R7301 Impaired fasting glucose: Secondary | ICD-10-CM | POA: Diagnosis not present

## 2019-05-04 DIAGNOSIS — R296 Repeated falls: Secondary | ICD-10-CM | POA: Diagnosis not present

## 2019-05-07 DIAGNOSIS — J449 Chronic obstructive pulmonary disease, unspecified: Secondary | ICD-10-CM | POA: Diagnosis not present

## 2019-05-07 DIAGNOSIS — F329 Major depressive disorder, single episode, unspecified: Secondary | ICD-10-CM | POA: Diagnosis not present

## 2019-05-07 DIAGNOSIS — E782 Mixed hyperlipidemia: Secondary | ICD-10-CM | POA: Diagnosis not present

## 2019-05-07 DIAGNOSIS — F325 Major depressive disorder, single episode, in full remission: Secondary | ICD-10-CM | POA: Diagnosis not present

## 2019-05-07 DIAGNOSIS — I10 Essential (primary) hypertension: Secondary | ICD-10-CM | POA: Diagnosis not present

## 2019-05-11 DIAGNOSIS — E785 Hyperlipidemia, unspecified: Secondary | ICD-10-CM | POA: Diagnosis not present

## 2019-05-11 DIAGNOSIS — K219 Gastro-esophageal reflux disease without esophagitis: Secondary | ICD-10-CM | POA: Diagnosis not present

## 2019-05-11 DIAGNOSIS — F17210 Nicotine dependence, cigarettes, uncomplicated: Secondary | ICD-10-CM | POA: Diagnosis not present

## 2019-05-11 DIAGNOSIS — M549 Dorsalgia, unspecified: Secondary | ICD-10-CM | POA: Diagnosis not present

## 2019-05-11 DIAGNOSIS — I10 Essential (primary) hypertension: Secondary | ICD-10-CM | POA: Diagnosis not present

## 2019-05-11 DIAGNOSIS — F325 Major depressive disorder, single episode, in full remission: Secondary | ICD-10-CM | POA: Diagnosis not present

## 2019-05-11 DIAGNOSIS — R296 Repeated falls: Secondary | ICD-10-CM | POA: Diagnosis not present

## 2019-05-11 DIAGNOSIS — F419 Anxiety disorder, unspecified: Secondary | ICD-10-CM | POA: Diagnosis not present

## 2019-05-11 DIAGNOSIS — G8929 Other chronic pain: Secondary | ICD-10-CM | POA: Diagnosis not present

## 2019-05-11 DIAGNOSIS — J449 Chronic obstructive pulmonary disease, unspecified: Secondary | ICD-10-CM | POA: Diagnosis not present

## 2019-05-11 DIAGNOSIS — Z79891 Long term (current) use of opiate analgesic: Secondary | ICD-10-CM | POA: Diagnosis not present

## 2019-05-24 DIAGNOSIS — K219 Gastro-esophageal reflux disease without esophagitis: Secondary | ICD-10-CM | POA: Diagnosis not present

## 2019-05-24 DIAGNOSIS — E785 Hyperlipidemia, unspecified: Secondary | ICD-10-CM | POA: Diagnosis not present

## 2019-05-24 DIAGNOSIS — F419 Anxiety disorder, unspecified: Secondary | ICD-10-CM | POA: Diagnosis not present

## 2019-05-24 DIAGNOSIS — G8929 Other chronic pain: Secondary | ICD-10-CM | POA: Diagnosis not present

## 2019-05-24 DIAGNOSIS — F17210 Nicotine dependence, cigarettes, uncomplicated: Secondary | ICD-10-CM | POA: Diagnosis not present

## 2019-05-24 DIAGNOSIS — F325 Major depressive disorder, single episode, in full remission: Secondary | ICD-10-CM | POA: Diagnosis not present

## 2019-05-24 DIAGNOSIS — I10 Essential (primary) hypertension: Secondary | ICD-10-CM | POA: Diagnosis not present

## 2019-05-24 DIAGNOSIS — J449 Chronic obstructive pulmonary disease, unspecified: Secondary | ICD-10-CM | POA: Diagnosis not present

## 2019-05-24 DIAGNOSIS — R296 Repeated falls: Secondary | ICD-10-CM | POA: Diagnosis not present

## 2019-05-24 DIAGNOSIS — Z79891 Long term (current) use of opiate analgesic: Secondary | ICD-10-CM | POA: Diagnosis not present

## 2019-05-24 DIAGNOSIS — M549 Dorsalgia, unspecified: Secondary | ICD-10-CM | POA: Diagnosis not present

## 2019-06-15 DIAGNOSIS — R05 Cough: Secondary | ICD-10-CM | POA: Diagnosis not present

## 2019-06-19 ENCOUNTER — Other Ambulatory Visit: Payer: Self-pay | Admitting: Psychiatry

## 2019-07-15 ENCOUNTER — Encounter: Payer: Self-pay | Admitting: Psychiatry

## 2019-07-15 ENCOUNTER — Ambulatory Visit (INDEPENDENT_AMBULATORY_CARE_PROVIDER_SITE_OTHER): Payer: PPO | Admitting: Psychiatry

## 2019-07-15 DIAGNOSIS — F331 Major depressive disorder, recurrent, moderate: Secondary | ICD-10-CM

## 2019-07-15 DIAGNOSIS — G4720 Circadian rhythm sleep disorder, unspecified type: Secondary | ICD-10-CM | POA: Diagnosis not present

## 2019-07-15 DIAGNOSIS — F4310 Post-traumatic stress disorder, unspecified: Secondary | ICD-10-CM | POA: Diagnosis not present

## 2019-07-15 DIAGNOSIS — F5105 Insomnia due to other mental disorder: Secondary | ICD-10-CM

## 2019-07-15 MED ORDER — ALPRAZOLAM 0.5 MG PO TABS: ORAL_TABLET | ORAL | 5 refills | Status: DC

## 2019-07-15 MED ORDER — TRAZODONE HCL 100 MG PO TABS: ORAL_TABLET | ORAL | 1 refills | Status: DC

## 2019-07-15 MED ORDER — VENLAFAXINE HCL ER 75 MG PO CP24: 225.0000 mg | ORAL_CAPSULE | Freq: Every day | ORAL | 1 refills | Status: DC

## 2019-07-15 MED ORDER — RISPERIDONE 1 MG PO TABS: 1.0000 mg | ORAL_TABLET | Freq: Every day | ORAL | 1 refills | Status: DC

## 2019-07-15 NOTE — Progress Notes (Signed)
Melinda Gomez [id number] 02-14-1946 74 y.o.  Virtual Visit via [place]  I connected with pt by WebEx and verified that I am speaking with the correct person using two identifiers.   I discussed the limitations, risks, security and privacy concerns of performing an evaluation and management service by Jackqul[facility] [place] and the availability of in person appointments. I also discussed with the patient that there may be a patient responsible charge related to this service. The patient expressed understanding and agreed to proceed.  I discussed the assessment and treatment plan with the patient. The patient was provided an opportunity to ask questions and all were answered. The patient agreed with the plan and demonstrated an understanding of the instructions.   The patient was advised to call back or seek an in-person evaluation if the symptoms worsen or if the condition fails to improve as anticipated.  I provided 30 minutes of video time during this encounter. The call started at 1130 and ended at 12:00. The patient was located at home and the provider was located office.   Subjective:   Patient ID:  Melinda Gomez is a 74 y.o. (DOB 1945/04/09) female.  Chief Complaint:  Chief Complaint  Patient presents with  . Follow-up  . Depression  . Anxiety  . Sleeping Problem    HPI [name] is followed up for chronic depression and anxiety and insomnia.  visit was July 15, 2018.  She was still dealing with chronic insomnia.  We discussed potential med changes but none were made per her request.  Last visit October 2020.  No meds were changed.  \July 15, 2019 appointment, the following is noted: 2 years of not sleeping.  Going to bed 10 pm .  Awakens a lot 4-5 hours sleep.  Denies napping.  Fight staying awake.   Caffeine 3 cups of coffee up to 5 pm.  Has to have it when removes dentures.  Drowsy daytime. Not anxious nor depressed.  Occ too irritable but not in mos except yesterday.  Would  like to sleep longer  No naps usually.  At night takes trazodone 150, hydroxyzine 25, no alprazolam at night.  Calm and [name] agrees.  Overall is quite well. No SE meds.  Usual Xanax prn anxiety not set time.  Not taken daily.  Otherwise fine without depression, anxiety and anger. Mood seems fine even when doesn't get enough sleep.  Patient reports stable mood and denies depressed or irritable moods.  Patient denies any recent difficulty with anxiety.  Patient denies difficulty with sleep initiation but poor maintenance. Denies appetite disturbance.  Eating fine.  Patient reports that energy and motivation have been good.  Patient denies any difficulty with concentration.  Patient denies any suicidal ideation.  Past Psychiatric Medication Trials: Trazodone up to 150, alprazolam, hydroxyzine, mirtazapine 7.5 jerks, Belsomra hangover. risperidone,  venlafaxine, buspirone, Paxil 60 to 80 mg, Wellbutrin side effects, Prozac,   Review of Systems:  Review of Systems  Neurological: Positive for weakness. Negative for tremors.       Recent 2 falls at night.  Not always at night.  Psychiatric/Behavioral: Positive for depression and sleep disturbance.  Has talked to Doctor about falls but no known diagnosis for it.  Not dizzy before falls. Can't get herself up when she falls.  Medications: I have reviewed the patient's current medications.  Current Outpatient Medications  Medication Sig Dispense Refill  . albuterol (VENTOLIN HFA) 108 (90 Base) MCG/ACT inhaler     . ALPRAZolam Duanne Moron)  0.5 MG tablet 1 tablet 3 times daily as needed for anxiety 90 tablet 5  . amLODipine (NORVASC) 5 MG tablet     . ANORO ELLIPTA 62.5-25 MCG/INH AEPB     . diphenhydrAMINE (BENADRYL) 25 mg capsule Take 25 mg by mouth every 6 (six) hours as needed.    Marland Kitchen esomeprazole (NEXIUM) 40 MG capsule     . HYDROcodone-acetaminophen (NORCO/VICODIN) 5-325 MG tablet     . hydrOXYzine (ATARAX/VISTARIL) 25 MG tablet Take 25 mg by mouth  at bedtime.    Marland Kitchen losartan (COZAAR) 100 MG tablet     . risperiDONE (RISPERDAL) 1 MG tablet Take 1 tablet (1 mg total) by mouth at bedtime. 90 tablet 1  . rosuvastatin (CRESTOR) 20 MG tablet     . traZODone (DESYREL) 100 MG tablet 1-1/2 to 2 tablets at night as needed for sleep 180 tablet 1  . venlafaxine XR (EFFEXOR-XR) 75 MG 24 hr capsule Take 3 capsules (225 mg total) by mouth daily. 270 capsule 1   No current facility-administered medications for this visit.    Medication Side Effects: None  No evidence for falls related to meds.  No pattern to falls. Allergies:  Allergies  Allergen Reactions  . Talwin [Pentazocine] Other (See Comments)    hallucinations    History reviewed. No pertinent past medical history.  History reviewed. No pertinent family history.  Social History   Socioeconomic History  . Marital status: Married    Spouse name: Not on file  . Number of children: Not on file  . Years of education: Not on file  . Highest education level: Not on file  Occupational History  . Not on file  Tobacco Use  . Smoking status: Current Every Day Smoker    Types: Cigarettes  . Smokeless tobacco: Never Used  Substance and Sexual Activity  . Alcohol use: Not on file  . Drug use: Not on file  . Sexual activity: Not on file  Other Topics Concern  . Not on file  Social History Narrative  . Not on file   Social Determinants of Health   Financial Resource Strain:   . Difficulty of Paying Living Expenses:   Food Insecurity:   . Worried About Charity fundraiser in the Last Year:   . Arboriculturist in the Last Year:   Transportation Needs:   . Film/video editor (Medical):   Marland Kitchen Lack of Transportation (Non-Medical):   Physical Activity:   . Days of Exercise per Week:   . Minutes of Exercise per Session:   Stress:   . Feeling of Stress :   Social Connections:   . Frequency of Communication with Friends and Family:   . Frequency of Social Gatherings with Friends  and Family:   . Attends Religious Services:   . Active Member of Clubs or Organizations:   . Attends Archivist Meetings:   Marland Kitchen Marital Status:   Intimate Partner Violence:   . Fear of Current or Ex-Partner:   . Emotionally Abused:   Marland Kitchen Physically Abused:   . Sexually Abused:     Past Medical History, Surgical history, Social history, and Family history were reviewed and updated as appropriate.   Please see review of systems for further details on the patient's review from today.   Objective:   Physical Exam:  There were no vitals taken for this visit.  Physical Exam Neurological:     Mental Status: She is alert and oriented to  person, place, and time.     Cranial Nerves: No dysarthria.  Psychiatric:        Attention and Perception: Attention normal.        Mood and Affect: Mood normal. Affect is not tearful.        Speech: Speech normal.        Behavior: Behavior is cooperative.        Thought Content: Thought content normal. Thought content is not paranoid or delusional. Thought content does not include homicidal or suicidal ideation. Thought content does not include homicidal or suicidal plan.        Cognition and Memory: Cognition and memory normal.     Comments: Insight and judgment fair.     Lab Review:     Component Value Date/Time   NA 141 07/30/2008 0945   K (LL) 07/30/2008 0945    2.7 REPEATED TO VERIFY CRITICAL RESULT CALLED TO, READ BACK BY AND VERIFIED WITH: PATE K,RN 1115 07/30/08 SCALES H   CL 105 07/30/2008 0945   CO2 27 07/30/2008 0945   GLUCOSE 113 (H) 07/30/2008 0945   BUN 4 (L) 07/30/2008 0945   CREATININE 0.69 07/30/2008 0945   CALCIUM 8.9 07/30/2008 0945   GFRNONAA >60 07/30/2008 0945   GFRAA  07/30/2008 0945    >60        The eGFR has been calculated using the MDRD equation. This calculation has not been validated in all clinical situations. eGFR's persistently <60 mL/min signify possible Chronic Kidney Disease.       Component  Value Date/Time   WBC 7.1 07/30/2008 0945   RBC 4.52 07/30/2008 0945   HGB 14.2 07/30/2008 0945   HCT 40.7 07/30/2008 0945   PLT 229 07/30/2008 0945   MCV 90.0 07/30/2008 0945   MCHC 34.8 07/30/2008 0945   RDW 13.1 07/30/2008 0945   LYMPHSABS 2.4 07/30/2008 0945   MONOABS 0.6 07/30/2008 0945   EOSABS 0.1 07/30/2008 0945   BASOSABS 0.0 07/30/2008 0945    No results found for: POCLITH, LITHIUM   No results found for: PHENYTOIN, PHENOBARB, VALPROATE, CBMZ   .res Assessment: Plan:    Major depressive disorder, recurrent episode, moderate (HCC) - Plan: venlafaxine XR (EFFEXOR-XR) 75 MG 24 hr capsule, risperiDONE (RISPERDAL) 1 MG tablet  Insomnia due to mental condition - Plan: traZODone (DESYREL) 100 MG tablet  Disturbed sleep rhythm - Plan: traZODone (DESYREL) 100 MG tablet  PTSD (post-traumatic stress disorder) - Plan: venlafaxine XR (EFFEXOR-XR) 75 MG 24 hr capsule, ALPRAZolam (XANAX) 0.5 MG tablet, risperiDONE (RISPERDAL) 1 MG tablet   Sleep hygiene in detail.  Lately her sleep has been a little better than usual but it still highly variable.  She does appear to be getting a greater quantity of sleep than last visit which is positive for her overall mental health..  We will avoid benzodiazepines at night for sleep because she has a high risk of tolerance with those.  She gets some initial benefit from the current medications.  We discussed the fall risk related to any sedative medication.  She has mentioned to following in the past but did not mention any following today.  Switch 5 PM coffee to decaf.  Increase trazodone to 200 mg HS.   Option Seroquel for TR insomnia but greater SE risk.  Defer this  Overall her depression and anxiety associated with PTSD are fairly managed.  She has a long psychiatric history but in general has been more stable in the last few years  except for the chronic insomnia complaints.  She has a fairly restricted life which is how in part she manages her  anxiety but it does not contribute to depression so she satisfied.  She does not want medicine change today.  Encourage Covid vaccine  No other medicine changes today  Follow-up 6 months  Lynder Parents, MD, DFAPA    Please see After Visit Summary for patient specific instructions.  No future appointments.  No orders of the defined types were placed in this encounter.     -------------------------------

## 2019-09-28 ENCOUNTER — Telehealth: Payer: Self-pay | Admitting: Psychiatry

## 2019-09-28 NOTE — Telephone Encounter (Signed)
Melinda Gomez called because she is almost out of her trazodone and the pharmacy said she can't fill until august.  She thought Dr. Clovis Pu told her she could take up to 4/night,, and that is what she has been doing.  The bottle says to take 2-2 1/2 so she is running out early.  Please send in new prescription with new dose with approve to fill.  [place].

## 2019-09-29 ENCOUNTER — Other Ambulatory Visit: Payer: Self-pay | Admitting: Psychiatry

## 2019-09-29 DIAGNOSIS — F5105 Insomnia due to other mental disorder: Secondary | ICD-10-CM

## 2019-09-29 DIAGNOSIS — G4720 Circadian rhythm sleep disorder, unspecified type: Secondary | ICD-10-CM

## 2019-09-29 MED ORDER — TRAZODONE HCL 100 MG PO TABS: ORAL_TABLET | ORAL | 0 refills | Status: DC

## 2019-09-29 NOTE — Telephone Encounter (Signed)
Rx sent for trazodone 300-400 hs, but ask her to take the lowest dose that works.

## 2019-09-29 NOTE — Telephone Encounter (Signed)
Left detailed message with instructions.

## 2019-10-22 DIAGNOSIS — F325 Major depressive disorder, single episode, in full remission: Secondary | ICD-10-CM | POA: Diagnosis not present

## 2019-10-22 DIAGNOSIS — G4700 Insomnia, unspecified: Secondary | ICD-10-CM | POA: Diagnosis not present

## 2019-10-22 DIAGNOSIS — I10 Essential (primary) hypertension: Secondary | ICD-10-CM | POA: Diagnosis not present

## 2019-10-22 DIAGNOSIS — F329 Major depressive disorder, single episode, unspecified: Secondary | ICD-10-CM | POA: Diagnosis not present

## 2019-10-22 DIAGNOSIS — J449 Chronic obstructive pulmonary disease, unspecified: Secondary | ICD-10-CM | POA: Diagnosis not present

## 2019-10-22 DIAGNOSIS — E782 Mixed hyperlipidemia: Secondary | ICD-10-CM | POA: Diagnosis not present

## 2019-11-01 ENCOUNTER — Ambulatory Visit
Admission: RE | Admit: 2019-11-01 | Discharge: 2019-11-01 | Disposition: A | Discharge status: Home / Self Care | Payer: PRIVATE HEALTH INSURANCE | Source: Ambulatory Visit | Attending: Internal Medicine | Admitting: Internal Medicine

## 2019-11-01 ENCOUNTER — Other Ambulatory Visit: Payer: Self-pay | Admitting: Internal Medicine

## 2019-11-01 DIAGNOSIS — R059 Cough, unspecified: Secondary | ICD-10-CM

## 2019-11-01 DIAGNOSIS — Z720 Tobacco use: Secondary | ICD-10-CM | POA: Diagnosis not present

## 2019-11-01 DIAGNOSIS — I10 Essential (primary) hypertension: Secondary | ICD-10-CM | POA: Diagnosis not present

## 2019-11-01 DIAGNOSIS — J449 Chronic obstructive pulmonary disease, unspecified: Secondary | ICD-10-CM | POA: Diagnosis not present

## 2019-11-01 DIAGNOSIS — K219 Gastro-esophageal reflux disease without esophagitis: Secondary | ICD-10-CM | POA: Diagnosis not present

## 2019-11-01 DIAGNOSIS — R05 Cough: Secondary | ICD-10-CM | POA: Diagnosis not present

## 2019-12-20 DIAGNOSIS — F325 Major depressive disorder, single episode, in full remission: Secondary | ICD-10-CM | POA: Diagnosis not present

## 2019-12-20 DIAGNOSIS — G4700 Insomnia, unspecified: Secondary | ICD-10-CM | POA: Diagnosis not present

## 2019-12-20 DIAGNOSIS — I10 Essential (primary) hypertension: Secondary | ICD-10-CM | POA: Diagnosis not present

## 2019-12-20 DIAGNOSIS — F329 Major depressive disorder, single episode, unspecified: Secondary | ICD-10-CM | POA: Diagnosis not present

## 2019-12-20 DIAGNOSIS — E782 Mixed hyperlipidemia: Secondary | ICD-10-CM | POA: Diagnosis not present

## 2019-12-20 DIAGNOSIS — J449 Chronic obstructive pulmonary disease, unspecified: Secondary | ICD-10-CM | POA: Diagnosis not present

## 2020-01-13 ENCOUNTER — Encounter: Payer: Self-pay | Admitting: Psychiatry

## 2020-01-13 ENCOUNTER — Telehealth (INDEPENDENT_AMBULATORY_CARE_PROVIDER_SITE_OTHER): Payer: PPO | Admitting: Psychiatry

## 2020-01-13 DIAGNOSIS — F4310 Post-traumatic stress disorder, unspecified: Secondary | ICD-10-CM | POA: Diagnosis not present

## 2020-01-13 DIAGNOSIS — F5105 Insomnia due to other mental disorder: Secondary | ICD-10-CM

## 2020-01-13 DIAGNOSIS — G4720 Circadian rhythm sleep disorder, unspecified type: Secondary | ICD-10-CM | POA: Diagnosis not present

## 2020-01-13 DIAGNOSIS — F331 Major depressive disorder, recurrent, moderate: Secondary | ICD-10-CM

## 2020-01-13 MED ORDER — RISPERIDONE 1 MG PO TABS: 1.0000 mg | ORAL_TABLET | Freq: Every day | ORAL | 1 refills | Status: DC

## 2020-01-13 MED ORDER — ALPRAZOLAM 0.5 MG PO TABS: ORAL_TABLET | ORAL | 5 refills | Status: DC

## 2020-01-13 MED ORDER — TRAZODONE HCL 100 MG PO TABS: ORAL_TABLET | ORAL | 0 refills | Status: DC

## 2020-01-13 MED ORDER — VENLAFAXINE HCL ER 75 MG PO CP24: 225.0000 mg | ORAL_CAPSULE | Freq: Every day | ORAL | 1 refills | Status: DC

## 2020-01-13 NOTE — Progress Notes (Signed)
Melinda Gomez [id number] 1945-10-28 74 y.o.  Virtual Visit via [place]  I connected with pt by WebEx and verified that I am speaking with the correct person using two identifiers.   I discussed the limitations, risks, security and privacy concerns of performing an evaluation and management service by Jackqul[facility] and the availability of in person appointments. I also discussed with the patient that there may be a patient responsible charge related to this service. The patient expressed understanding and agreed to proceed.  I discussed the assessment and treatment plan with the patient. The patient was provided an opportunity to ask questions and all were answered. The patient agreed with the plan and demonstrated an understanding of the instructions.   The patient was advised to call back or seek an in-person evaluation if the symptoms worsen or if the condition fails to improve as anticipated.  I provided 30 minutes of video time during this encounter. The call started at 100 and ended at 130 PM. The patient was located at home and the provider was located office.   Subjective:   Patient ID:  Melinda Gomez is a 74 y.o. (DOB 12/15/1945) female.  Chief Complaint:  Chief Complaint  Patient presents with  . Follow-up    Medication Management  . Depression    Medication Management    Depression        Past medical history includes anxiety.   Anxiety Patient reports no chest pain.     Melinda Gomez is followed up for chronic depression and anxiety and insomnia.  visit was July 15, 2018.  She was still dealing with chronic insomnia.  We discussed potential med changes but none were made per her request.  Last visit October 2020.  No meds were changed.  \July 15, 2019 appointment, the following is noted: 2 years of not sleeping.  Going to bed 10 pm .  Awakens a lot 4-5 hours sleep.  Denies napping.  Fight staying awake.   Caffeine 3 cups of coffee up to 5 pm.  Has to have it when  removes dentures.  Drowsy daytime. Not anxious nor depressed.  Occ too irritable but not in mos except yesterday.  Would like to sleep longer  No naps usually.  At night takes trazodone 150, hydroxyzine 25, no alprazolam at night.  Calm and [name] agrees.  Overall is quite well. No SE meds.  Usual Xanax prn anxiety not set time.  Not taken daily. Plan: Switch 5 PM coffee to decaf. Increase trazodone to 200 mg HS.    01/13/2020 appointment with the following noted: Sleep is better with 7-8 hours. Cut PM caffeine.   Overall mood good except for a couple of weeks.  [name] did something 25 years ago and that bears on her mind at times but turned it over to God.    Otherwise fine without depression, anxiety and anger. Mood seems fine even when doesn't get enough sleep.  Patient reports stable mood and denies depressed or irritable moods.  Patient denies any recent difficulty with anxiety.  Patient denies difficulty with sleep initiation and maintenance. Denies appetite disturbance.  Eating fine.  Patient reports that energy and motivation have been good.  Patient denies any difficulty with concentration.  Patient denies any suicidal ideation.  Past Psychiatric Medication Trials: Trazodone up to 150, alprazolam, hydroxyzine, mirtazapine 7.5 jerks, Belsomra hangover. risperidone, buspirone, venlafaxine,  Paxil 60 to 80 mg, Wellbutrin side effects, Prozac,   Review of Systems:  Review of Systems  Cardiovascular: Negative for chest pain.  Gastrointestinal: Negative for diarrhea and vomiting.  Neurological: Positive for weakness. Negative for tremors.       Recent 2 falls at night.  Not always at night.  Psychiatric/Behavioral: Positive for depression and sleep disturbance.  Has talked to Doctor about falls but no known diagnosis for it.  Not dizzy before falls. Can't get herself up when she falls.  Medications: I have reviewed the patient's current medications.  Current Outpatient Medications   Medication Sig Dispense Refill  . albuterol (VENTOLIN HFA) 108 (90 Base) MCG/ACT inhaler     . ALPRAZolam (XANAX) 0.5 MG tablet 1 tablet 3 times daily as needed for anxiety 90 tablet 5  . amLODipine (NORVASC) 5 MG tablet     . ANORO ELLIPTA 62.5-25 MCG/INH AEPB     . diphenhydrAMINE (BENADRYL) 25 mg capsule Take 25 mg by mouth every 6 (six) hours as needed.    Marland Kitchen losartan (COZAAR) 100 MG tablet     . risperiDONE (RISPERDAL) 1 MG tablet Take 1 tablet (1 mg total) by mouth at bedtime. 90 tablet 1  . traZODone (DESYREL) 100 MG tablet 3-4 tablets at night as needed for sleep. Take the lowest dose that works 270 tablet 0  . venlafaxine XR (EFFEXOR-XR) 75 MG 24 hr capsule Take 3 capsules (225 mg total) by mouth daily. 270 capsule 1   No current facility-administered medications for this visit.    Medication Side Effects: None  No evidence for falls related to meds.  No pattern to falls. Allergies:  Allergies  Allergen Reactions  . Talwin [Pentazocine] Other (See Comments)    hallucinations    History reviewed. No pertinent past medical history.  History reviewed. No pertinent family history.  Social History   Socioeconomic History  . Marital status: Married    Spouse name: Not on file  . Number of children: Not on file  . Years of education: Not on file  . Highest education level: Not on file  Occupational History  . Not on file  Tobacco Use  . Smoking status: Current Every Day Smoker    Types: Cigarettes  . Smokeless tobacco: Never Used  Substance and Sexual Activity  . Alcohol use: Not on file  . Drug use: Not on file  . Sexual activity: Not on file  Other Topics Concern  . Not on file  Social History Narrative  . Not on file   Social Determinants of Health   Financial Resource Strain:   . Difficulty of Paying Living Expenses: Not on file  Food Insecurity:   . Worried About Charity fundraiser in the Last Year: Not on file  . Ran Out of Food in the Last Year: Not  on file  Transportation Needs:   . Lack of Transportation (Medical): Not on file  . Lack of Transportation (Non-Medical): Not on file  Physical Activity:   . Days of Exercise per Week: Not on file  . Minutes of Exercise per Session: Not on file  Stress:   . Feeling of Stress : Not on file  Social Connections:   . Frequency of Communication with Friends and Family: Not on file  . Frequency of Social Gatherings with Friends and Family: Not on file  . Attends Religious Services: Not on file  . Active Member of Clubs or Organizations: Not on file  . Attends Archivist Meetings: Not on file  . Marital Status: Not on file  Intimate Partner Violence:   .  Fear of Current or Ex-Partner: Not on file  . Emotionally Abused: Not on file  . Physically Abused: Not on file  . Sexually Abused: Not on file    Past Medical History, Surgical history, Social history, and Family history were reviewed and updated as appropriate.   Please see review of systems for further details on the patient's review from today.   Objective:   Physical Exam:  There were no vitals taken for this visit.  Physical Exam Neurological:     Mental Status: She is alert and oriented to person, place, and time.     Cranial Nerves: No dysarthria.  Psychiatric:        Attention and Perception: Attention normal.        Mood and Affect: Mood is anxious. Affect is not tearful.        Speech: Speech normal.        Behavior: Behavior is cooperative.        Thought Content: Thought content normal. Thought content is not paranoid or delusional. Thought content does not include homicidal or suicidal ideation. Thought content does not include homicidal or suicidal plan.        Cognition and Memory: Cognition and memory normal.     Comments: Insight and judgment fair.     Lab Review:     Component Value Date/Time   NA 141 07/30/2008 0945   K (LL) 07/30/2008 0945    2.7 REPEATED TO VERIFY CRITICAL RESULT CALLED TO,  READ BACK BY AND VERIFIED WITH: PATE K,RN 1115 07/30/08 SCALES H   CL 105 07/30/2008 0945   CO2 27 07/30/2008 0945   GLUCOSE 113 (H) 07/30/2008 0945   BUN 4 (L) 07/30/2008 0945   CREATININE 0.69 07/30/2008 0945   CALCIUM 8.9 07/30/2008 0945   GFRNONAA >60 07/30/2008 0945   GFRAA  07/30/2008 0945    >60        The eGFR has been calculated using the MDRD equation. This calculation has not been validated in all clinical situations. eGFR's persistently <60 mL/min signify possible Chronic Kidney Disease.       Component Value Date/Time   WBC 7.1 07/30/2008 0945   RBC 4.52 07/30/2008 0945   HGB 14.2 07/30/2008 0945   HCT 40.7 07/30/2008 0945   PLT 229 07/30/2008 0945   MCV 90.0 07/30/2008 0945   MCHC 34.8 07/30/2008 0945   RDW 13.1 07/30/2008 0945   LYMPHSABS 2.4 07/30/2008 0945   MONOABS 0.6 07/30/2008 0945   EOSABS 0.1 07/30/2008 0945   BASOSABS 0.0 07/30/2008 0945    No results found for: POCLITH, LITHIUM   No results found for: PHENYTOIN, PHENOBARB, VALPROATE, CBMZ   .res Assessment: Plan:    Major depressive disorder, recurrent episode, moderate (HCC) - Plan: risperiDONE (RISPERDAL) 1 MG tablet, venlafaxine XR (EFFEXOR-XR) 75 MG 24 hr capsule  PTSD (post-traumatic stress disorder) - Plan: risperiDONE (RISPERDAL) 1 MG tablet, venlafaxine XR (EFFEXOR-XR) 75 MG 24 hr capsule, ALPRAZolam (XANAX) 0.5 MG tablet  Insomnia due to mental condition - Plan: traZODone (DESYREL) 100 MG tablet  Disturbed sleep rhythm - Plan: traZODone (DESYREL) 100 MG tablet   Sleep hygiene in detail.  Lately her sleep has been a little better than usual but it still highly variable.  She does appear to be getting a greater quantity of sleep than last visit which is positive for her overall mental health..  We will avoid benzodiazepines at night for sleep because she has a high risk of tolerance with  those.  She gets some initial benefit from the current medications.  We discussed the fall risk  related to any sedative medication.  She has mentioned to following in the past but did not mention any following today.  Switch 5 PM coffee to decaf.  Sleep is better.  Continue trazodone to 200 mg HS.   Option Seroquel for TR insomnia but greater SE risk.  Defer this  Overall her depression and anxiety associated with PTSD are fairly managed.  She has a long psychiatric history but in general has been more stable in the last few years except for the chronic insomnia complaints.  She has a fairly restricted life which is how in part she manages her anxiety but it does not contribute to depression so she satisfied.  She does not want medicine change today.  Encourage Covid vaccine .  She claims doctor friends tell her not to get it. CBT around issue with [name].  No other medicine changes today  Follow-up 6 months  Lynder Parents, MD, DFAPA    Please see After Visit Summary for patient specific instructions.  No future appointments.  No orders of the defined types were placed in this encounter.     -------------------------------

## 2020-01-18 DIAGNOSIS — Z1159 Encounter for screening for other viral diseases: Secondary | ICD-10-CM | POA: Diagnosis not present

## 2020-01-21 ENCOUNTER — Other Ambulatory Visit: Payer: Self-pay | Admitting: Gastroenterology

## 2020-01-21 DIAGNOSIS — K5730 Diverticulosis of large intestine without perforation or abscess without bleeding: Secondary | ICD-10-CM | POA: Diagnosis not present

## 2020-01-21 DIAGNOSIS — Z86010 Personal history of colonic polyps: Secondary | ICD-10-CM

## 2020-01-21 DIAGNOSIS — K5792 Diverticulitis of intestine, part unspecified, without perforation or abscess without bleeding: Secondary | ICD-10-CM

## 2020-02-16 ENCOUNTER — Ambulatory Visit
Admission: RE | Admit: 2020-02-16 | Discharge: 2020-02-16 | Disposition: A | Discharge status: Home / Self Care | Payer: PRIVATE HEALTH INSURANCE | Source: Ambulatory Visit | Attending: Gastroenterology | Admitting: Gastroenterology

## 2020-02-16 DIAGNOSIS — K635 Polyp of colon: Secondary | ICD-10-CM | POA: Diagnosis not present

## 2020-02-16 DIAGNOSIS — R918 Other nonspecific abnormal finding of lung field: Secondary | ICD-10-CM | POA: Diagnosis not present

## 2020-02-16 DIAGNOSIS — K5730 Diverticulosis of large intestine without perforation or abscess without bleeding: Secondary | ICD-10-CM | POA: Diagnosis not present

## 2020-02-16 DIAGNOSIS — K5792 Diverticulitis of intestine, part unspecified, without perforation or abscess without bleeding: Secondary | ICD-10-CM

## 2020-02-16 DIAGNOSIS — Z86010 Personal history of colonic polyps: Secondary | ICD-10-CM

## 2020-02-16 DIAGNOSIS — I700 Atherosclerosis of aorta: Secondary | ICD-10-CM | POA: Diagnosis not present

## 2020-03-23 DIAGNOSIS — I10 Essential (primary) hypertension: Secondary | ICD-10-CM | POA: Diagnosis not present

## 2020-03-23 DIAGNOSIS — K219 Gastro-esophageal reflux disease without esophagitis: Secondary | ICD-10-CM | POA: Diagnosis not present

## 2020-03-23 DIAGNOSIS — G4700 Insomnia, unspecified: Secondary | ICD-10-CM | POA: Diagnosis not present

## 2020-03-23 DIAGNOSIS — F325 Major depressive disorder, single episode, in full remission: Secondary | ICD-10-CM | POA: Diagnosis not present

## 2020-03-23 DIAGNOSIS — J449 Chronic obstructive pulmonary disease, unspecified: Secondary | ICD-10-CM | POA: Diagnosis not present

## 2020-03-23 DIAGNOSIS — E782 Mixed hyperlipidemia: Secondary | ICD-10-CM | POA: Diagnosis not present

## 2020-03-23 DIAGNOSIS — F329 Major depressive disorder, single episode, unspecified: Secondary | ICD-10-CM | POA: Diagnosis not present

## 2020-03-29 ENCOUNTER — Other Ambulatory Visit: Payer: Self-pay | Admitting: Psychiatry

## 2020-03-29 DIAGNOSIS — F331 Major depressive disorder, recurrent, moderate: Secondary | ICD-10-CM

## 2020-03-29 DIAGNOSIS — F4310 Post-traumatic stress disorder, unspecified: Secondary | ICD-10-CM

## 2020-05-05 DIAGNOSIS — G4700 Insomnia, unspecified: Secondary | ICD-10-CM | POA: Diagnosis not present

## 2020-05-05 DIAGNOSIS — F3341 Major depressive disorder, recurrent, in partial remission: Secondary | ICD-10-CM | POA: Diagnosis not present

## 2020-05-05 DIAGNOSIS — Z23 Encounter for immunization: Secondary | ICD-10-CM | POA: Diagnosis not present

## 2020-05-05 DIAGNOSIS — Z1389 Encounter for screening for other disorder: Secondary | ICD-10-CM | POA: Diagnosis not present

## 2020-05-05 DIAGNOSIS — Z0000 Encounter for general adult medical examination without abnormal findings: Secondary | ICD-10-CM | POA: Diagnosis not present

## 2020-05-05 DIAGNOSIS — R7301 Impaired fasting glucose: Secondary | ICD-10-CM | POA: Diagnosis not present

## 2020-05-05 DIAGNOSIS — F4000 Agoraphobia, unspecified: Secondary | ICD-10-CM | POA: Diagnosis not present

## 2020-05-05 DIAGNOSIS — R519 Headache, unspecified: Secondary | ICD-10-CM | POA: Diagnosis not present

## 2020-05-05 DIAGNOSIS — I700 Atherosclerosis of aorta: Secondary | ICD-10-CM | POA: Diagnosis not present

## 2020-05-05 DIAGNOSIS — J449 Chronic obstructive pulmonary disease, unspecified: Secondary | ICD-10-CM | POA: Diagnosis not present

## 2020-05-05 DIAGNOSIS — Z7189 Other specified counseling: Secondary | ICD-10-CM | POA: Diagnosis not present

## 2020-05-05 DIAGNOSIS — E782 Mixed hyperlipidemia: Secondary | ICD-10-CM | POA: Diagnosis not present

## 2020-05-05 DIAGNOSIS — Z720 Tobacco use: Secondary | ICD-10-CM | POA: Diagnosis not present

## 2020-05-05 DIAGNOSIS — I10 Essential (primary) hypertension: Secondary | ICD-10-CM | POA: Diagnosis not present

## 2020-05-05 DIAGNOSIS — R918 Other nonspecific abnormal finding of lung field: Secondary | ICD-10-CM | POA: Diagnosis not present

## 2020-05-29 ENCOUNTER — Other Ambulatory Visit: Payer: Self-pay | Admitting: Psychiatry

## 2020-05-29 DIAGNOSIS — F331 Major depressive disorder, recurrent, moderate: Secondary | ICD-10-CM

## 2020-05-29 DIAGNOSIS — F4310 Post-traumatic stress disorder, unspecified: Secondary | ICD-10-CM

## 2020-06-05 ENCOUNTER — Other Ambulatory Visit: Payer: Self-pay | Admitting: Psychiatry

## 2020-06-05 DIAGNOSIS — F5105 Insomnia due to other mental disorder: Secondary | ICD-10-CM

## 2020-06-05 DIAGNOSIS — G4720 Circadian rhythm sleep disorder, unspecified type: Secondary | ICD-10-CM

## 2020-06-14 DIAGNOSIS — F329 Major depressive disorder, single episode, unspecified: Secondary | ICD-10-CM | POA: Diagnosis not present

## 2020-06-14 DIAGNOSIS — K219 Gastro-esophageal reflux disease without esophagitis: Secondary | ICD-10-CM | POA: Diagnosis not present

## 2020-06-14 DIAGNOSIS — F3341 Major depressive disorder, recurrent, in partial remission: Secondary | ICD-10-CM | POA: Diagnosis not present

## 2020-06-14 DIAGNOSIS — F325 Major depressive disorder, single episode, in full remission: Secondary | ICD-10-CM | POA: Diagnosis not present

## 2020-06-14 DIAGNOSIS — J449 Chronic obstructive pulmonary disease, unspecified: Secondary | ICD-10-CM | POA: Diagnosis not present

## 2020-06-14 DIAGNOSIS — I10 Essential (primary) hypertension: Secondary | ICD-10-CM | POA: Diagnosis not present

## 2020-06-14 DIAGNOSIS — G4700 Insomnia, unspecified: Secondary | ICD-10-CM | POA: Diagnosis not present

## 2020-06-14 DIAGNOSIS — E782 Mixed hyperlipidemia: Secondary | ICD-10-CM | POA: Diagnosis not present

## 2020-06-21 ENCOUNTER — Telehealth (INDEPENDENT_AMBULATORY_CARE_PROVIDER_SITE_OTHER): Payer: PPO | Admitting: Psychiatry

## 2020-06-21 ENCOUNTER — Encounter: Payer: Self-pay | Admitting: Psychiatry

## 2020-06-21 DIAGNOSIS — F5105 Insomnia due to other mental disorder: Secondary | ICD-10-CM

## 2020-06-21 DIAGNOSIS — F331 Major depressive disorder, recurrent, moderate: Secondary | ICD-10-CM | POA: Diagnosis not present

## 2020-06-21 DIAGNOSIS — F4310 Post-traumatic stress disorder, unspecified: Secondary | ICD-10-CM | POA: Diagnosis not present

## 2020-06-21 DIAGNOSIS — G4720 Circadian rhythm sleep disorder, unspecified type: Secondary | ICD-10-CM

## 2020-06-21 MED ORDER — VENLAFAXINE HCL ER 75 MG PO CP24: 225.0000 mg | ORAL_CAPSULE | Freq: Every day | ORAL | 1 refills | Status: DC

## 2020-06-21 MED ORDER — RISPERIDONE 1 MG PO TABS: ORAL_TABLET | ORAL | 1 refills | Status: DC

## 2020-06-21 MED ORDER — ALPRAZOLAM 0.5 MG PO TABS
ORAL_TABLET | ORAL | 5 refills | Status: DC
Start: 2020-06-21 — End: 2020-12-21

## 2020-06-21 MED ORDER — TRAZODONE HCL 100 MG PO TABS
ORAL_TABLET | ORAL | 0 refills | Status: DC
Start: 2020-06-21 — End: 2021-01-04

## 2020-06-21 NOTE — Progress Notes (Signed)
Melinda Gomez [id number] 1945/12/29 75 y.o.  Virtual Visit via Telephone Note  I connected with pt by telephone and verified that I am speaking with the correct person using two identifiers.   I discussed the limitations, risks, security and privacy concerns of performing an evaluation and management service by telephone and the availability of in person appointments. I also discussed with the patient that there may be a patient responsible charge related to this service. The patient expressed understanding and agreed to proceed.  I discussed the assessment and treatment plan with the patient. The patient was provided an opportunity to ask questions and all were answered. The patient agreed with the plan and demonstrated an understanding of the instructions.   The patient was advised to call back or seek an in-person evaluation if the symptoms worsen or if the condition fails to improve as anticipated.  I provided 30 minutes of non-face-to-face time during this encounter. The call started at 1045 and ended at 1115. The patient was located at home and the provider was located office.   Subjective:   Patient ID:  Melinda Gomez is a 75 y.o. (DOB [birth year]) female.  Chief Complaint:  Chief Complaint  Patient presents with  . Follow-up  . Major depressive disorder, recurrent episode, moderate ([place])  . Sleeping Problem  . Anxiety  . Depression    Depression        Past medical history includes anxiety.   Anxiety Symptoms include nervous/anxious behavior. Patient reports no chest pain or palpitations.     [name] is followed up for chronic depression and anxiety and insomnia.  visit was July 15, 2018.  She was still dealing with chronic insomnia.  We discussed potential med changes but none were made per her request.  Last visit October 2020.  No meds were changed.  \July 15, 2019 appointment, the following is noted: 2 years of not sleeping.  Going to bed 10 pm .   Awakens a lot 4-5 hours sleep.  Denies napping.  Fight staying awake.   Caffeine 3 cups of coffee up to 5 pm.  Has to have it when removes dentures.  Drowsy daytime. Not anxious nor depressed.  Occ too irritable but not in mos except yesterday.  Would like to sleep longer  No naps usually.  At night takes trazodone 150, hydroxyzine 25, no alprazolam at night.  Calm and [name] agrees.  Overall is quite well. No SE meds.  Usual Xanax prn anxiety not set time.  Not taken daily. Plan: Switch 5 PM coffee to decaf. Increase trazodone to 200 mg HS.    01/13/2020 appointment with the following noted: Sleep is better with 7-8 hours. Cut PM caffeine.   Overall mood good except for a couple of weeks.  [name] did something 25 years ago and that bears on her mind at times but turned it over to God.    06/21/2020 appt today: Taking trazodone 200 mg HS. Good days and bad days.  Taking more alprazolam than usual TID.  Found out 2 weeks ago that [name] has started up with girl he went to college with making her more nervous and upset.  He's aware she knows.  She thinks he's broken off the relationship but this is the third time.  Blames herself.  Can not have sex anymore.  More anxious and angry.  Otherwise mood is fine.  Sleep overall is better.  May nap in the morning.  No SE  Otherwise fine  without depression. Mood seems fine even when doesn't get enough sleep.  Patient reports stable mood and denies depressed or irritable moods.  Patient denies any recent difficulty with anxiety.  Patient denies difficulty with sleep initiation and maintenance. Denies appetite disturbance.  Eating fine.  Patient reports that energy and motivation have been good.  Patient denies any difficulty with concentration.  Patient denies any suicidal ideation.  Past Psychiatric Medication Trials: Trazodone up to 150, alprazolam 0.5 TID, hydroxyzine, mirtazapine 7.5 jerks, Belsomra hangover. risperidone, buspirone, venlafaxine,  Paxil 60 to 80  mg, Wellbutrin side effects, Prozac,   Review of Systems:  Review of Systems  Cardiovascular: Negative for chest pain and palpitations.  Gastrointestinal: Negative for diarrhea and vomiting.  Neurological: Positive for weakness. Negative for tremors.       Recent 2 falls at night.  Not always at night.  Psychiatric/Behavioral: Positive for depression. Negative for sleep disturbance. The patient is nervous/anxious.   Has talked to Doctor about falls but no known diagnosis for it.  Not dizzy before falls. Can't get herself up when she falls.  Medications: I have reviewed the patient's current medications.  Current Outpatient Medications  Medication Sig Dispense Refill  . albuterol (VENTOLIN HFA) 108 (90 Base) MCG/ACT inhaler     . ALPRAZolam (XANAX) 0.5 MG tablet 1 tablet 3 times daily as needed for anxiety 90 tablet 5  . amLODipine (NORVASC) 5 MG tablet     . ANORO ELLIPTA 62.5-25 MCG/INH AEPB     . diphenhydrAMINE (BENADRYL) 25 mg capsule Take 25 mg by mouth every 6 (six) hours as needed.    Marland Kitchen losartan (COZAAR) 100 MG tablet     . risperiDONE (RISPERDAL) 1 MG tablet TAKE 1 TABLET BY MOUTH AT BEDTIME. FOR MOOD 30 tablet 0  . traZODone (DESYREL) 100 MG tablet TAKE 3 TO 4 TABLETS AT BEDTIME AS NEEDED FOR SLEEP. USE THE LOWEST DOSE THAT WORKS. 270 tablet 0  . venlafaxine XR (EFFEXOR-XR) 75 MG 24 hr capsule Take 3 capsules (225 mg total) by mouth daily. 270 capsule 1   No current facility-administered medications for this visit.    Medication Side Effects: None  No evidence for falls related to meds.  No pattern to falls. Allergies:  Allergies  Allergen Reactions  . Talwin [Pentazocine] Other (See Comments)    hallucinations    History reviewed. No pertinent past medical history.  History reviewed. No pertinent family history.  Social History   Socioeconomic History  . Marital status: Married    Spouse name: Not on file  . Number of children: Not on file  . Years of  education: Not on file  . Highest education level: Not on file  Occupational History  . Not on file  Tobacco Use  . Smoking status: Current Every Day Smoker    Types: Cigarettes  . Smokeless tobacco: Never Used  Substance and Sexual Activity  . Alcohol use: Not on file  . Drug use: Not on file  . Sexual activity: Not on file  Other Topics Concern  . Not on file  Social History Narrative  . Not on file   Social Determinants of Health   Financial Resource Strain: Not on file  Food Insecurity: Not on file  Transportation Needs: Not on file  Physical Activity: Not on file  Stress: Not on file  Social Connections: Not on file  Intimate Partner Violence: Not on file    Past Medical History, Surgical history, Social history, and Family history  were reviewed and updated as appropriate.   Please see review of systems for further details on the patient's review from today.   Objective:   Physical Exam:  There were no vitals taken for this visit.  Physical Exam Neurological:     Mental Status: She is alert and oriented to person, place, and time.     Cranial Nerves: No dysarthria.  Psychiatric:        Attention and Perception: Attention normal.        Mood and Affect: Mood is anxious. Mood is not depressed. Affect is not tearful.        Speech: Speech normal.        Behavior: Behavior is cooperative.        Thought Content: Thought content normal. Thought content is not paranoid or delusional. Thought content does not include homicidal or suicidal ideation. Thought content does not include homicidal or suicidal plan.        Cognition and Memory: Cognition and memory normal.     Comments: Insight and judgment fair. More anxious.       Lab Review:     Component Value Date/Time   NA 141 07/30/2008 0945   K (LL) 07/30/2008 0945    2.7 REPEATED TO VERIFY CRITICAL RESULT CALLED TO, READ BACK BY AND VERIFIED WITH: PATE K,RN 1115 07/30/08 SCALES H   CL 105 07/30/2008 0945   CO2  27 07/30/2008 0945   GLUCOSE 113 (H) 07/30/2008 0945   BUN 4 (L) 07/30/2008 0945   CREATININE 0.69 07/30/2008 0945   CALCIUM 8.9 07/30/2008 0945   GFRNONAA >60 07/30/2008 0945   GFRAA  07/30/2008 0945    >60        The eGFR has been calculated using the MDRD equation. This calculation has not been validated in all clinical situations. eGFR's persistently <60 mL/min signify possible Chronic Kidney Disease.       Component Value Date/Time   WBC 7.1 07/30/2008 0945   RBC 4.52 07/30/2008 0945   HGB 14.2 07/30/2008 0945   HCT 40.7 07/30/2008 0945   PLT 229 07/30/2008 0945   MCV 90.0 07/30/2008 0945   MCHC 34.8 07/30/2008 0945   RDW 13.1 07/30/2008 0945   LYMPHSABS 2.4 07/30/2008 0945   MONOABS 0.6 07/30/2008 0945   EOSABS 0.1 07/30/2008 0945   BASOSABS 0.0 07/30/2008 0945    No results found for: POCLITH, LITHIUM   No results found for: PHENYTOIN, PHENOBARB, VALPROATE, CBMZ   .res Assessment: Plan:    Major depressive disorder, recurrent episode, moderate (HCC)  PTSD (post-traumatic stress disorder)  Insomnia due to mental condition  Disturbed sleep rhythm   Sleep hygiene in detail.  Lately her sleep has been a little better than usual but it still highly variable.  She does appear to be getting a greater quantity of sleep than last visit which is positive for her overall mental health..  We will avoid benzodiazepines at night for sleep because she has a high risk of tolerance with those.  She gets some initial benefit from the current medications.  We discussed the fall risk related to any sedative medication.  She has mentioned to following in the past but did not mention any following today.  Supportive therapy and problem solving around [facility] affairs.  Disc how to talk with him about it.  Switch 5 PM coffee to decaf.  Sleep is better.  Continue trazodone to 200 mg HS.   Option Seroquel for TR insomnia but greater  SE risk.  Defer this Risperidone 1 mg HS Continue  Effexor XR 225 mg daily. Continue alprazolam 0.5 mg TID prn.  Don't exceed dose.  Overall her depression and anxiety associated with PTSD are fairly managed.  She has a long psychiatric history but in general has been more stable in the last few years except for the chronic insomnia complaints.  She has a fairly restricted life which is how in part she manages her anxiety but it does not contribute to depression so she satisfied.  She does not want medicine change today.  Encourage Covid vaccine .  She claims doctor friends tell her not to get it. CBT around issue with [name].  No other medicine changes today  Follow-up 6 months  Lynder Parents, MD, DFAPA    Please see After Visit Summary for patient specific instructions.  No future appointments.  No orders of the defined types were placed in this encounter.     -------------------------------

## 2020-07-14 DIAGNOSIS — J449 Chronic obstructive pulmonary disease, unspecified: Secondary | ICD-10-CM | POA: Diagnosis not present

## 2020-07-14 DIAGNOSIS — E782 Mixed hyperlipidemia: Secondary | ICD-10-CM | POA: Diagnosis not present

## 2020-07-14 DIAGNOSIS — I10 Essential (primary) hypertension: Secondary | ICD-10-CM | POA: Diagnosis not present

## 2020-07-14 DIAGNOSIS — F325 Major depressive disorder, single episode, in full remission: Secondary | ICD-10-CM | POA: Diagnosis not present

## 2020-07-14 DIAGNOSIS — G4700 Insomnia, unspecified: Secondary | ICD-10-CM | POA: Diagnosis not present

## 2020-07-14 DIAGNOSIS — K219 Gastro-esophageal reflux disease without esophagitis: Secondary | ICD-10-CM | POA: Diagnosis not present

## 2020-09-05 DIAGNOSIS — G4700 Insomnia, unspecified: Secondary | ICD-10-CM | POA: Diagnosis not present

## 2020-09-05 DIAGNOSIS — I10 Essential (primary) hypertension: Secondary | ICD-10-CM | POA: Diagnosis not present

## 2020-09-05 DIAGNOSIS — E782 Mixed hyperlipidemia: Secondary | ICD-10-CM | POA: Diagnosis not present

## 2020-09-05 DIAGNOSIS — J449 Chronic obstructive pulmonary disease, unspecified: Secondary | ICD-10-CM | POA: Diagnosis not present

## 2020-09-05 DIAGNOSIS — F329 Major depressive disorder, single episode, unspecified: Secondary | ICD-10-CM | POA: Diagnosis not present

## 2020-09-05 DIAGNOSIS — F325 Major depressive disorder, single episode, in full remission: Secondary | ICD-10-CM | POA: Diagnosis not present

## 2020-09-05 DIAGNOSIS — F3341 Major depressive disorder, recurrent, in partial remission: Secondary | ICD-10-CM | POA: Diagnosis not present

## 2020-09-05 DIAGNOSIS — K219 Gastro-esophageal reflux disease without esophagitis: Secondary | ICD-10-CM | POA: Diagnosis not present

## 2020-12-18 DIAGNOSIS — K219 Gastro-esophageal reflux disease without esophagitis: Secondary | ICD-10-CM | POA: Diagnosis not present

## 2020-12-18 DIAGNOSIS — Z23 Encounter for immunization: Secondary | ICD-10-CM | POA: Diagnosis not present

## 2020-12-18 DIAGNOSIS — R918 Other nonspecific abnormal finding of lung field: Secondary | ICD-10-CM | POA: Diagnosis not present

## 2020-12-18 DIAGNOSIS — J449 Chronic obstructive pulmonary disease, unspecified: Secondary | ICD-10-CM | POA: Diagnosis not present

## 2020-12-18 DIAGNOSIS — I10 Essential (primary) hypertension: Secondary | ICD-10-CM | POA: Diagnosis not present

## 2020-12-19 ENCOUNTER — Other Ambulatory Visit: Payer: Self-pay | Admitting: Internal Medicine

## 2020-12-19 DIAGNOSIS — R918 Other nonspecific abnormal finding of lung field: Secondary | ICD-10-CM

## 2020-12-19 DIAGNOSIS — I10 Essential (primary) hypertension: Secondary | ICD-10-CM

## 2020-12-21 ENCOUNTER — Other Ambulatory Visit: Payer: Self-pay | Admitting: Psychiatry

## 2020-12-21 DIAGNOSIS — F4310 Post-traumatic stress disorder, unspecified: Secondary | ICD-10-CM

## 2020-12-26 ENCOUNTER — Other Ambulatory Visit: Payer: Self-pay | Admitting: Psychiatry

## 2020-12-26 DIAGNOSIS — F331 Major depressive disorder, recurrent, moderate: Secondary | ICD-10-CM

## 2020-12-26 DIAGNOSIS — F4310 Post-traumatic stress disorder, unspecified: Secondary | ICD-10-CM

## 2020-12-27 NOTE — Telephone Encounter (Signed)
90 day ok?

## 2020-12-29 DIAGNOSIS — G4700 Insomnia, unspecified: Secondary | ICD-10-CM | POA: Diagnosis not present

## 2020-12-29 DIAGNOSIS — F325 Major depressive disorder, single episode, in full remission: Secondary | ICD-10-CM | POA: Diagnosis not present

## 2020-12-29 DIAGNOSIS — F3341 Major depressive disorder, recurrent, in partial remission: Secondary | ICD-10-CM | POA: Diagnosis not present

## 2020-12-29 DIAGNOSIS — F329 Major depressive disorder, single episode, unspecified: Secondary | ICD-10-CM | POA: Diagnosis not present

## 2020-12-29 DIAGNOSIS — K219 Gastro-esophageal reflux disease without esophagitis: Secondary | ICD-10-CM | POA: Diagnosis not present

## 2020-12-29 DIAGNOSIS — I10 Essential (primary) hypertension: Secondary | ICD-10-CM | POA: Diagnosis not present

## 2020-12-29 DIAGNOSIS — E782 Mixed hyperlipidemia: Secondary | ICD-10-CM | POA: Diagnosis not present

## 2020-12-29 DIAGNOSIS — J449 Chronic obstructive pulmonary disease, unspecified: Secondary | ICD-10-CM | POA: Diagnosis not present

## 2020-12-30 ENCOUNTER — Other Ambulatory Visit: Payer: Self-pay

## 2020-12-30 ENCOUNTER — Ambulatory Visit
Admission: RE | Admit: 2020-12-30 | Discharge: 2020-12-30 | Disposition: A | Discharge status: Home / Self Care | Payer: PRIVATE HEALTH INSURANCE | Source: Ambulatory Visit | Attending: Internal Medicine | Admitting: Internal Medicine

## 2020-12-30 DIAGNOSIS — I700 Atherosclerosis of aorta: Secondary | ICD-10-CM | POA: Diagnosis not present

## 2020-12-30 DIAGNOSIS — J479 Bronchiectasis, uncomplicated: Secondary | ICD-10-CM | POA: Diagnosis not present

## 2020-12-30 DIAGNOSIS — R918 Other nonspecific abnormal finding of lung field: Secondary | ICD-10-CM

## 2020-12-30 DIAGNOSIS — I10 Essential (primary) hypertension: Secondary | ICD-10-CM

## 2020-12-30 DIAGNOSIS — R911 Solitary pulmonary nodule: Secondary | ICD-10-CM | POA: Diagnosis not present

## 2021-01-04 ENCOUNTER — Ambulatory Visit (INDEPENDENT_AMBULATORY_CARE_PROVIDER_SITE_OTHER): Payer: PPO | Admitting: Psychiatry

## 2021-01-04 ENCOUNTER — Encounter: Payer: Self-pay | Admitting: Psychiatry

## 2021-01-04 DIAGNOSIS — F4310 Post-traumatic stress disorder, unspecified: Secondary | ICD-10-CM

## 2021-01-04 DIAGNOSIS — G4720 Circadian rhythm sleep disorder, unspecified type: Secondary | ICD-10-CM | POA: Diagnosis not present

## 2021-01-04 DIAGNOSIS — F5105 Insomnia due to other mental disorder: Secondary | ICD-10-CM

## 2021-01-04 DIAGNOSIS — F331 Major depressive disorder, recurrent, moderate: Secondary | ICD-10-CM

## 2021-01-04 MED ORDER — TRAZODONE HCL 100 MG PO TABS: ORAL_TABLET | ORAL | 2 refills | Status: DC

## 2021-01-04 MED ORDER — ALPRAZOLAM 0.5 MG PO TABS
ORAL_TABLET | ORAL | 4 refills | Status: DC
Start: 2021-01-04 — End: 2021-08-06

## 2021-01-04 MED ORDER — VENLAFAXINE HCL ER 75 MG PO CP24: 225.0000 mg | ORAL_CAPSULE | Freq: Every day | ORAL | 1 refills | Status: DC

## 2021-01-04 MED ORDER — RISPERIDONE 1 MG PO TABS
ORAL_TABLET | ORAL | 3 refills | Status: DC
Start: 2021-01-04 — End: 2022-01-04
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
## 2021-02-21 ENCOUNTER — Other Ambulatory Visit: Payer: Self-pay

## 2021-02-21 ENCOUNTER — Encounter (HOSPITAL_COMMUNITY): Payer: Self-pay | Admitting: Emergency Medicine

## 2021-02-21 ENCOUNTER — Emergency Department (HOSPITAL_COMMUNITY): Payer: PPO

## 2021-02-21 ENCOUNTER — Emergency Department (HOSPITAL_COMMUNITY)
Admission: EM | Admit: 2021-02-21 | Discharge: 2021-02-22 | Disposition: A | Discharge status: Home / Self Care | Payer: PPO | Source: Home / Self Care

## 2021-02-21 DIAGNOSIS — I214 Non-ST elevation (NSTEMI) myocardial infarction: Secondary | ICD-10-CM | POA: Diagnosis not present

## 2021-02-21 DIAGNOSIS — Z20822 Contact with and (suspected) exposure to covid-19: Secondary | ICD-10-CM | POA: Insufficient documentation

## 2021-02-21 DIAGNOSIS — R110 Nausea: Secondary | ICD-10-CM | POA: Insufficient documentation

## 2021-02-21 DIAGNOSIS — R0609 Other forms of dyspnea: Secondary | ICD-10-CM | POA: Diagnosis not present

## 2021-02-21 DIAGNOSIS — R0602 Shortness of breath: Secondary | ICD-10-CM | POA: Diagnosis not present

## 2021-02-21 DIAGNOSIS — J9811 Atelectasis: Secondary | ICD-10-CM | POA: Diagnosis not present

## 2021-02-21 DIAGNOSIS — R609 Edema, unspecified: Secondary | ICD-10-CM | POA: Diagnosis not present

## 2021-02-21 DIAGNOSIS — R002 Palpitations: Secondary | ICD-10-CM | POA: Insufficient documentation

## 2021-02-21 DIAGNOSIS — Z5321 Procedure and treatment not carried out due to patient leaving prior to being seen by health care provider: Secondary | ICD-10-CM | POA: Insufficient documentation

## 2021-02-21 DIAGNOSIS — R079 Chest pain, unspecified: Secondary | ICD-10-CM

## 2021-02-21 DIAGNOSIS — I10 Essential (primary) hypertension: Secondary | ICD-10-CM | POA: Diagnosis not present

## 2021-02-21 DIAGNOSIS — J449 Chronic obstructive pulmonary disease, unspecified: Secondary | ICD-10-CM | POA: Diagnosis not present

## 2021-02-21 DIAGNOSIS — Z79899 Other long term (current) drug therapy: Secondary | ICD-10-CM | POA: Diagnosis not present

## 2021-02-21 DIAGNOSIS — R42 Dizziness and giddiness: Secondary | ICD-10-CM | POA: Insufficient documentation

## 2021-02-21 DIAGNOSIS — I700 Atherosclerosis of aorta: Secondary | ICD-10-CM | POA: Diagnosis not present

## 2021-02-21 DIAGNOSIS — F17210 Nicotine dependence, cigarettes, uncomplicated: Secondary | ICD-10-CM | POA: Diagnosis not present

## 2021-02-21 DIAGNOSIS — R0789 Other chest pain: Secondary | ICD-10-CM | POA: Diagnosis not present

## 2021-02-21 HISTORY — DX: Essential (primary) hypertension: I10

## 2021-02-21 HISTORY — DX: Pure hypercholesterolemia, unspecified: E78.00

## 2021-02-21 HISTORY — DX: Chronic obstructive pulmonary disease, unspecified: J44.9

## 2021-02-21 LAB — BASIC METABOLIC PANEL
Anion gap: 7 (ref 5–15)
BUN: 5 mg/dL — ABNORMAL LOW (ref 8–23)
CO2: 29 mmol/L (ref 22–32)
Calcium: 9.1 mg/dL (ref 8.9–10.3)
Chloride: 102 mmol/L (ref 98–111)
Creatinine, Ser: 0.85 mg/dL (ref 0.44–1.00)
GFR, Estimated: >60 mL/min (ref >60)
Glucose, Bld: 119 mg/dL — ABNORMAL HIGH (ref 70–99)
Potassium: 3.6 mmol/L (ref 3.5–5.1)
Sodium: 138 mmol/L (ref 135–145)

## 2021-02-21 LAB — CBC
HCT: 41 % (ref 36.0–46.0)
Hemoglobin: 13.5 g/dL (ref 12.0–15.0)
MCH: 30.3 pg (ref 26.0–34.0)
MCHC: 32.9 g/dL (ref 30.0–36.0)
MCV: 91.9 fL (ref 80.0–100.0)
Platelets: 217 10*3/uL (ref 150–400)
RBC: 4.46 MIL/uL (ref 3.87–5.11)
RDW: 13 % (ref 11.5–15.5)
WBC: 10.7 10*3/uL — ABNORMAL HIGH (ref 4.0–10.5)
nRBC: 0 % (ref 0.0–0.2)

## 2021-02-21 LAB — RESP PANEL BY RT-PCR (FLU A&B, COVID) ARPGX2
Influenza A by PCR: NEGATIVE
Influenza B by PCR: NEGATIVE
SARS Coronavirus 2 by RT PCR: NEGATIVE

## 2021-02-21 LAB — BRAIN NATRIURETIC PEPTIDE: B Natriuretic Peptide: 139.6 pg/mL — ABNORMAL HIGH (ref 0.0–100.0)

## 2021-02-21 LAB — TROPONIN I (HIGH SENSITIVITY): Troponin I (High Sensitivity): 17 ng/L (ref <18)

## 2021-02-21 NOTE — ED Provider Notes (Signed)
Emergency Medicine Provider Triage Evaluation Note  Melinda Gomez , a 75 y.o. female  was evaluated in triage.  Pt complains of chest pain and shortness of breath.  Chest pain ongoing for the last several days.  Shortness of breath is also ongoing but intermittent.  Reports bilateral lower extremity edema for the last 2 weeks.  Seen by cardiology today who did an EKG and felt that it was abnormal and she needed evaluation in the ED.  She also had elevated cardiac markers as an outpatient.  Review of Systems  Positive: Chest pain, sob, ble edema Negative: fever  Physical Exam  BP (!) 148/72   Pulse 93   Temp 98.9 F (37.2 C) (Oral)   Resp 20   SpO2 95%  Gen:   Awake, no distress   Resp:  Normal effort  MSK:   Moves extremities without difficulty  Other:  2-3+ pitting edema to the ble, heart with rrr, lungs clear  Medical Decision Making  Medically screening exam initiated at 7:34 PM.  Appropriate orders placed.  Melinda Gomez was informed that the remainder of the evaluation will be completed by another provider, this initial triage assessment does not replace that evaluation, and the importance of remaining in the ED until their evaluation is complete.     Bishop Dublin 02/21/21 1936    Lacretia Leigh, MD 02/22/21 1546

## 2021-02-21 NOTE — ED Triage Notes (Signed)
Pt sent here by Melinda Montana MD for increased troponin's. Pt reports having palpitations and feels like her heart is racing for a few days now, reports nausea 3 days ago and lightheadedness x3 weeks. Denies cp/shob.

## 2021-02-22 ENCOUNTER — Emergency Department (HOSPITAL_BASED_OUTPATIENT_CLINIC_OR_DEPARTMENT_OTHER): Payer: PPO

## 2021-02-22 ENCOUNTER — Encounter (HOSPITAL_BASED_OUTPATIENT_CLINIC_OR_DEPARTMENT_OTHER): Payer: Self-pay | Admitting: Urology

## 2021-02-22 ENCOUNTER — Observation Stay (HOSPITAL_BASED_OUTPATIENT_CLINIC_OR_DEPARTMENT_OTHER)
Admission: EM | Admit: 2021-02-22 | Discharge: 2021-02-23 | Disposition: A | Discharge status: Home / Self Care | Payer: PPO | Attending: Internal Medicine | Admitting: Internal Medicine

## 2021-02-22 DIAGNOSIS — R0789 Other chest pain: Secondary | ICD-10-CM | POA: Diagnosis not present

## 2021-02-22 DIAGNOSIS — I10 Essential (primary) hypertension: Secondary | ICD-10-CM | POA: Diagnosis not present

## 2021-02-22 DIAGNOSIS — Z79899 Other long term (current) drug therapy: Secondary | ICD-10-CM | POA: Diagnosis not present

## 2021-02-22 DIAGNOSIS — R079 Chest pain, unspecified: Secondary | ICD-10-CM | POA: Diagnosis present

## 2021-02-22 DIAGNOSIS — Z20822 Contact with and (suspected) exposure to covid-19: Secondary | ICD-10-CM | POA: Insufficient documentation

## 2021-02-22 DIAGNOSIS — R002 Palpitations: Secondary | ICD-10-CM | POA: Insufficient documentation

## 2021-02-22 DIAGNOSIS — F411 Generalized anxiety disorder: Secondary | ICD-10-CM | POA: Diagnosis not present

## 2021-02-22 DIAGNOSIS — E876 Hypokalemia: Secondary | ICD-10-CM | POA: Diagnosis not present

## 2021-02-22 DIAGNOSIS — E7800 Pure hypercholesterolemia, unspecified: Secondary | ICD-10-CM | POA: Diagnosis not present

## 2021-02-22 DIAGNOSIS — I214 Non-ST elevation (NSTEMI) myocardial infarction: Secondary | ICD-10-CM | POA: Diagnosis not present

## 2021-02-22 DIAGNOSIS — F17210 Nicotine dependence, cigarettes, uncomplicated: Secondary | ICD-10-CM | POA: Insufficient documentation

## 2021-02-22 DIAGNOSIS — R0602 Shortness of breath: Secondary | ICD-10-CM | POA: Diagnosis not present

## 2021-02-22 DIAGNOSIS — J449 Chronic obstructive pulmonary disease, unspecified: Secondary | ICD-10-CM | POA: Insufficient documentation

## 2021-02-22 HISTORY — DX: Generalized anxiety disorder: F41.1

## 2021-02-22 HISTORY — DX: Gastro-esophageal reflux disease without esophagitis: K21.9

## 2021-02-22 LAB — COMPREHENSIVE METABOLIC PANEL
ALT: 15 U/L (ref 0–44)
AST: 18 U/L (ref 15–41)
Albumin: 4.6 g/dL (ref 3.5–5.0)
Alkaline Phosphatase: 56 U/L (ref 38–126)
Anion gap: 10 (ref 5–15)
BUN: 8 mg/dL (ref 8–23)
CO2: 27 mmol/L (ref 22–32)
Calcium: 9.8 mg/dL (ref 8.9–10.3)
Chloride: 101 mmol/L (ref 98–111)
Creatinine, Ser: 0.8 mg/dL (ref 0.44–1.00)
GFR, Estimated: >60 mL/min (ref >60)
Glucose, Bld: 113 mg/dL — ABNORMAL HIGH (ref 70–99)
Potassium: 3.4 mmol/L — ABNORMAL LOW (ref 3.5–5.1)
Sodium: 138 mmol/L (ref 135–145)
Total Bilirubin: 0.5 mg/dL (ref 0.3–1.2)
Total Protein: 7 g/dL (ref 6.5–8.1)

## 2021-02-22 LAB — CBC WITH DIFFERENTIAL/PLATELET
Abs Immature Granulocytes: 0.01 10*3/uL (ref 0.00–0.07)
Basophils Absolute: 0 10*3/uL (ref 0.0–0.1)
Basophils Relative: 0 %
Eosinophils Absolute: 0.1 10*3/uL (ref 0.0–0.5)
Eosinophils Relative: 1 %
HCT: 41.9 % (ref 36.0–46.0)
Hemoglobin: 14.1 g/dL (ref 12.0–15.0)
Immature Granulocytes: 0 %
Lymphocytes Relative: 20 %
Lymphs Abs: 1.6 10*3/uL (ref 0.7–4.0)
MCH: 29.9 pg (ref 26.0–34.0)
MCHC: 33.7 g/dL (ref 30.0–36.0)
MCV: 88.8 fL (ref 80.0–100.0)
Monocytes Absolute: 0.8 10*3/uL (ref 0.1–1.0)
Monocytes Relative: 10 %
Neutro Abs: 5.3 10*3/uL (ref 1.7–7.7)
Neutrophils Relative %: 69 %
Platelets: 210 10*3/uL (ref 150–400)
RBC: 4.72 MIL/uL (ref 3.87–5.11)
RDW: 12.9 % (ref 11.5–15.5)
WBC: 7.8 10*3/uL (ref 4.0–10.5)
nRBC: 0 % (ref 0.0–0.2)

## 2021-02-22 LAB — D-DIMER, QUANTITATIVE: D-Dimer, Quant: 0.54 ug/mL-FEU — ABNORMAL HIGH (ref 0.00–0.50)

## 2021-02-22 LAB — BRAIN NATRIURETIC PEPTIDE: B Natriuretic Peptide: 226.6 pg/mL — ABNORMAL HIGH (ref 0.0–100.0)

## 2021-02-22 LAB — TROPONIN I (HIGH SENSITIVITY)
Troponin I (High Sensitivity): 20 ng/L — ABNORMAL HIGH (ref <18)
Troponin I (High Sensitivity): 18 ng/L — ABNORMAL HIGH (ref <18)
Troponin I (High Sensitivity): 20 ng/L — ABNORMAL HIGH (ref <18)

## 2021-02-22 MED ORDER — ALBUTEROL SULFATE HFA 108 (90 BASE) MCG/ACT IN AERS
1.0000 | INHALATION_SPRAY | RESPIRATORY_TRACT | Status: DC | PRN
  Filled 2021-02-22: qty 6.7

## 2021-02-22 MED ORDER — FLUTICASONE-UMECLIDIN-VILANT 100-62.5-25 MCG/ACT IN AEPB
1.0000 | INHALATION_SPRAY | Freq: Every day | RESPIRATORY_TRACT | Status: DC
Start: 2021-02-23 — End: 2021-02-22

## 2021-02-22 MED ORDER — VENLAFAXINE HCL ER 75 MG PO CP24
225.0000 mg | ORAL_CAPSULE | Freq: Every day | ORAL | Status: DC
  Administered 2021-02-23: 225 mg via ORAL
  Filled 2021-02-22: qty 1

## 2021-02-22 MED ORDER — RISPERIDONE 1 MG PO TABS
1.0000 mg | ORAL_TABLET | Freq: Every day | ORAL | Status: DC
  Administered 2021-02-22: 1 mg via ORAL
  Filled 2021-02-22: qty 1
  Filled 2021-02-22: qty 2
  Filled 2021-02-22: qty 1

## 2021-02-22 MED ORDER — NITROGLYCERIN 0.4 MG SL SUBL: 0.4000 mg | SUBLINGUAL_TABLET | SUBLINGUAL | Status: DC | PRN

## 2021-02-22 MED ORDER — NICOTINE 14 MG/24HR TD PT24: 14.0000 mg | MEDICATED_PATCH | Freq: Every day | TRANSDERMAL | Status: DC | PRN

## 2021-02-22 MED ORDER — ACETAMINOPHEN 650 MG RE SUPP: 650.0000 mg | Freq: Four times a day (QID) | RECTAL | Status: DC | PRN

## 2021-02-22 MED ORDER — ACETAMINOPHEN 325 MG PO TABS
650.0000 mg | ORAL_TABLET | Freq: Four times a day (QID) | ORAL | Status: DC | PRN
  Administered 2021-02-22: 650 mg via ORAL
  Filled 2021-02-22: qty 2

## 2021-02-22 MED ORDER — AMLODIPINE BESYLATE 5 MG PO TABS
5.0000 mg | ORAL_TABLET | Freq: Every day | ORAL | Status: DC
  Administered 2021-02-23: 5 mg via ORAL
  Filled 2021-02-22: qty 1

## 2021-02-22 MED ORDER — ALPRAZOLAM 0.5 MG PO TABS
0.5000 mg | ORAL_TABLET | Freq: Three times a day (TID) | ORAL | Status: DC | PRN
  Administered 2021-02-22 – 2021-02-23 (×3): 0.5 mg via ORAL
  Filled 2021-02-22 (×3): qty 1

## 2021-02-22 MED ORDER — ROSUVASTATIN CALCIUM 20 MG PO TABS
20.0000 mg | ORAL_TABLET | Freq: Every day | ORAL | Status: DC
  Administered 2021-02-23: 20 mg via ORAL
  Filled 2021-02-22: qty 1

## 2021-02-22 MED ORDER — UMECLIDINIUM BROMIDE 62.5 MCG/ACT IN AEPB
1.0000 | INHALATION_SPRAY | Freq: Every day | RESPIRATORY_TRACT | Status: DC
  Administered 2021-02-23: 1 via RESPIRATORY_TRACT
  Filled 2021-02-22: qty 7

## 2021-02-22 MED ORDER — FLUTICASONE FUROATE-VILANTEROL 100-25 MCG/ACT IN AEPB
1.0000 | INHALATION_SPRAY | Freq: Every day | RESPIRATORY_TRACT | Status: DC
  Administered 2021-02-23: 1 via RESPIRATORY_TRACT
  Filled 2021-02-22: qty 28

## 2021-02-22 MED ORDER — NITROGLYCERIN 0.3 MG SL SUBL
0.3000 mg | SUBLINGUAL_TABLET | SUBLINGUAL | Status: DC | PRN
  Filled 2021-02-22: qty 100

## 2021-02-22 MED ORDER — POTASSIUM CHLORIDE CRYS ER 20 MEQ PO TBCR
40.0000 meq | EXTENDED_RELEASE_TABLET | Freq: Once | ORAL | Status: AC
  Administered 2021-02-22: 40 meq via ORAL
  Filled 2021-02-22: qty 2

## 2021-02-22 MED ORDER — MELATONIN 3 MG PO TABS: 3.0000 mg | ORAL_TABLET | Freq: Every evening | ORAL | Status: DC | PRN

## 2021-02-22 MED ORDER — FLUTICASONE-UMECLIDIN-VILANT 100-62.5-25 MCG/ACT IN AEPB: 1.0000 | INHALATION_SPRAY | Freq: Every day | RESPIRATORY_TRACT | Status: DC

## 2021-02-22 MED ORDER — LOSARTAN POTASSIUM 50 MG PO TABS
100.0000 mg | ORAL_TABLET | Freq: Every day | ORAL | Status: DC
  Administered 2021-02-23: 100 mg via ORAL
  Filled 2021-02-22: qty 2

## 2021-02-22 MED ORDER — PANTOPRAZOLE SODIUM 40 MG IV SOLR
40.0000 mg | INTRAVENOUS | Status: DC
  Administered 2021-02-22: 40 mg via INTRAVENOUS
  Filled 2021-02-22: qty 40

## 2021-02-22 MED ORDER — ASPIRIN 325 MG PO TABS
325.0000 mg | ORAL_TABLET | Freq: Every day | ORAL | Status: DC
  Administered 2021-02-22 – 2021-02-23 (×2): 325 mg via ORAL
  Filled 2021-02-22 (×2): qty 1

## 2021-02-22 NOTE — ED Provider Notes (Signed)
[facility] EMERGENCY DEPT Provider Note   CSN: [id number] Arrival date & time: 02/22/21  1418     History Chief Complaint  Patient presents with   Shoulder Pain   Shortness of Breath    Melinda Gomez is a 75 y.o. female.  The history is provided by the patient.  Shoulder Pain Associated symptoms: back pain and fatigue   Shortness of Breath Associated symptoms: chest pain and cough   Associated symptoms: no abdominal pain and no rash   Patient presents with shortness of breath.  Chest tightness.  Is had for the last few days.  Has had cough.  Has had symptoms over the last 2 weeks with some swelling.  Reportedly saw patient's PCP who had elevated troponin.  Reportedly had abnormal EKG and was told to come into the ER.  Went to [facility] yesterday and was screened but did not complete evaluation.  Troponin was 17 and then 20.  Has had a cough.  No sputum production.  History of COPD.  States pain in the right back.  States worse after coughing.    Past Medical History:  Diagnosis Date   COPD (chronic obstructive pulmonary disease) ([place])    Hypercholesteremia    Hypertension     Patient Active Problem List   Diagnosis Date Noted   PTSD (post-traumatic stress disorder) 01/02/2018   GERD (gastroesophageal reflux disease) 01/02/2018   Chronic insomnia 01/02/2018    History reviewed. No pertinent surgical history.   OB History   No obstetric history on file.     History reviewed. No pertinent family history.  Social History   Tobacco Use   Smoking status: Every Day    Packs/day: 0.50    Types: Cigarettes   Smokeless tobacco: Never  Substance Use Topics   Alcohol use: Never   Drug use: Never    Home Medications Prior to Admission medications   Medication Sig Start Date End Date Taking? Authorizing Provider  albuterol (VENTOLIN HFA) 108 (90 Base) MCG/ACT inhaler  07/27/18  Yes [provider]  ALPRAZolam (XANAX) 0.5 MG tablet TAKE ONE  TABLET BY MOUTH 3 TIMES DAILY AS NEEDED FOR ANXIETY. 01/04/21  Yes Cottle, Billey Co., MD  amLODipine (NORVASC) 5 MG tablet Take 5 mg by mouth daily. 06/08/18  Yes [provider]  Biotin 5000 MCG TABS Take 5,000 mcg by mouth daily.   Yes [provider]  Cholecalciferol (VITAMIN D3) 125 MCG (5000 UT) TABS Take 5,000 Units by mouth daily.   Yes [provider]  cholestyramine Lucrezia Starch) 4 GM/DOSE powder Take by mouth See admin instructions. 13 grams added to 2-6 oz of water as needed   Yes [provider]  Fluticasone-Umeclidin-Vilant (TRELEGY ELLIPTA) 100-62.5-25 MCG/ACT AEPB Inhale 1 puff into the lungs daily.   Yes [provider]  Garlic (GARLIQUE PO) Take 1 tablet by mouth daily.   Yes [provider]  losartan (COZAAR) 100 MG tablet Take 100 mg by mouth daily. 06/08/18  Yes [provider]  melatonin 5 MG TABS Take 5 mg by mouth at bedtime.   Yes [provider]  risperiDONE (RISPERDAL) 1 MG tablet TAKE 1 TABLET BY MOUTH AT BEDTIME. FOR MOOD 01/04/21  Yes Cottle, Billey Co., MD  rosuvastatin (CRESTOR) 20 MG tablet Take 20 mg by mouth daily.   Yes [provider]  traZODone (DESYREL) 100 MG tablet 1 to 2 tablets at night as needed for sleep 01/04/21  Yes Cottle, Allayne Butcher  Brooke Bonito., MD  venlafaxine XR (EFFEXOR-XR) 75 MG 24 hr capsule Take 3 capsules (225 mg total) by mouth daily. 01/04/21  Yes Cottle, Billey Co., MD  Celedonio Miyamoto 62.5-25 MCG/INH AEPB  05/12/18   [provider]  diphenhydrAMINE (BENADRYL) 25 mg capsule Take 25 mg by mouth every 6 (six) hours as needed.    [provider]    Allergies    Talwin [pentazocine]  Review of Systems   Review of Systems  Constitutional:  Positive for appetite change and fatigue.  HENT:  Negative for congestion.   Respiratory:  Positive for cough and shortness of breath.   Cardiovascular:  Positive for chest pain. Negative for leg swelling.   Gastrointestinal:  Negative for abdominal pain.  Genitourinary:  Negative for flank pain.  Musculoskeletal:  Positive for back pain.  Skin:  Negative for rash.  Neurological:  Negative for weakness.  Psychiatric/Behavioral:  Negative for confusion.    Physical Exam Updated Vital Signs BP 123/78   Pulse (!) 104   Temp 98.6 F (37 C)   Resp (!) 23   Ht 5\' 2"  (1.575 m)   Wt 65.8 kg   SpO2 96%   BMI 26.52 kg/m   Physical Exam Vitals reviewed.  HENT:     Head: Normocephalic.  Cardiovascular:     Rate and Rhythm: Normal rate.  Pulmonary:     Breath sounds: Wheezing present.  Chest:     Chest wall: Tenderness present.  Abdominal:     Tenderness: There is no abdominal tenderness.  Musculoskeletal:     Cervical back: Neck supple.     Right lower leg: Edema present.     Left lower leg: Edema present.  Skin:    General: Skin is warm.     Capillary Refill: Capillary refill takes less than 2 seconds.  Neurological:     Mental Status: She is alert and oriented to person, place, and time.    ED Results / Procedures / Treatments   Labs (all labs ordered are listed, but only abnormal results are displayed) Labs Reviewed  TROPONIN I (HIGH SENSITIVITY) - Abnormal; Notable for the following components:      Result Value   Troponin I (High Sensitivity) 18 (*)    All other components within normal limits  CBC WITH DIFFERENTIAL/PLATELET  COMPREHENSIVE METABOLIC PANEL  BRAIN NATRIURETIC PEPTIDE    EKG EKG Interpretation  Date/Time:  Thursday February 22 2021 14:30:11 EST Ventricular Rate:  110 PR Interval:    QRS Duration: 138 QT Interval:  404 QTC Calculation: 546 R Axis:   237 Text Interpretation: Wide QRS rhythm Right superior axis deviation Non-specific intra-ventricular conduction block Cannot rule out Anterior infarct , age undetermined Abnormal ECG Confirmed by Davonna Belling [phone]) on 02/22/2021 2:35:52 PM  Radiology DG Chest 1 View  Result Date:  02/21/2021 CLINICAL DATA:  Chest pain. EXAM: CHEST  1 VIEW COMPARISON:  November 01, 2019 FINDINGS: Low lung volumes are seen with mild, stable atelectasis noted within the lateral aspect of the left lung base. There is no evidence of acute infiltrate, pleural effusion or pneumothorax. The heart size and mediastinal contours are within normal limits. Moderate severity calcification of the aortic arch is noted. The visualized skeletal structures are unremarkable. IMPRESSION: Low lung volumes with mild, stable left basilar atelectasis. Electronically Signed   By: Virgina Norfolk M.D.   On: 02/21/2021 20:25   DG Chest Portable 1 View  Result Date: 02/22/2021 CLINICAL DATA:  75 year old female with  history of shortness of breath. EXAM: PORTABLE CHEST 1 VIEW COMPARISON:  Chest x-ray 02/21/2021. FINDINGS: Mild chronic scarring in the left lung base. Lung volumes are normal. No acute consolidative airspace disease. No pleural effusions. No suspicious appearing pulmonary nodules or masses are noted. No evidence of pulmonary edema. Heart size is normal. Upper mediastinal contours are within normal limits. Atherosclerotic calcifications are noted in the thoracic aorta. IMPRESSION: 1. No radiographic evidence of acute cardiopulmonary disease. 2. Aortic atherosclerosis. Electronically Signed   By: Vinnie Langton M.D.   On: 02/22/2021 15:34    Procedures Procedures   Medications Ordered in ED Medications - No data to display  ED Course  I have reviewed the triage vital signs and the nursing notes.  Pertinent labs & imaging results that were available during my care of the patient were reviewed by me and considered in my medical decision making (see chart for details).    MDM Rules/Calculators/A&P                           Patient shortness of breath and cough.  Has had fatigue chest pain and coughing for the last 3 weeks.  Some leg swelling.  Tenderness to chest.  Diffuse wheezes.  Likely respiratory  cause.  Chest x-ray reassuring.  EKG however has some nonspecific changes.  Reviewed and is actually somewhat similar before.  First EKG done showed some changes that were likely related to baseline wander.  Also reviewed EKG with Dr. Harrell Gave from cardiology.  Lab work pending.  Likely URI symptoms.  Care turned over to Dr. Pearline Cables Final Clinical Impression(s) / ED Diagnoses Final diagnoses:  None    Rx / DC Orders ED Discharge Orders     None        Davonna Belling, MD 02/22/21 1611

## 2021-02-22 NOTE — H&P (Signed)
History and Physical    PLEASE NOTE THAT DRAGON DICTATION SOFTWARE WAS USED IN THE CONSTRUCTION OF THIS NOTE.   Melinda Gomez WLN:[id number] DOB: [birth year] DOA: 02/22/2021  PCP: Lavone Orn, MD  Patient coming from: home   I have personally briefly reviewed patient's old medical records in [place]  Chief Complaint: Chest pain  HPI: Melinda Gomez is a 75 y.o. female with medical history significant for hypertension, hyperlipidemia, COPD, GERD, generalized anxiety disorder, chronic tobacco abuse, who is admitted to [facility] on 02/22/2021 by way of transfer from [facility] emergency department for further evaluation of chest pain after presenting from home to [facility] ED complaining of such.  Patient with an episode of substernal nonradiating chest pressure earlier on 02/22/2021.  While the episode occurred at rest, she reports exacerbation of the associated intensity of this chest pain with exertion, which has been improving with rest.  She notes associated shortness of breath, in the absence of any palpitations, diaphoresis, nausea, vomiting, dizziness, presyncope, or syncope.  Chest pain subsequently resolved spontaneously in the absence of any ported nitroglycerin use, and patient now chest pain-free.  Resolution of the chest pain is also associated with resolution of shortness of breath.  She conveys that the chest pain was nonpositional, nonpleuritic, and not reproducible with direct palpation of the anterior chest wall.  No recent subjective fever, chills, rigors, generalized myalgias.  No recent new onset cough, and denies any recent hemoptysis.  Denies any known underlying coronary artery disease, reporting that most recent cardiac stress test occurred 15 years ago, with report of no evidence of reversible ischemia.  No prior echo on file per my chart review this morning.  She notes a history of hypertension, hyperlipidemia and is a current smoker.  Medical history  also notable for GERD as well as a history of generalized anxiety disorder.  No recent calf tenderness or new lower extremity erythema.  No recent preceding trauma or travel.    [facility] ED Course:  Vital signs in the ED were notable for the following: Afebrile; heart rate 10 2-1 08; blood pressure 130/60 158/84; respiratory rate 15-25; oxygen saturation 93 to 95% on room air.-   Labs were notable for the following: CMP notable for the following: Potassium 3.4, bicarbonate 27, creatinine 0.8, and liver enzymes were found to be within normal limits.  High-sensitivity troponin I noted to be 18 initially, with repeat value trending up slightly to 20.  CBC notable for white blood cell count 7800, hemoglobin 14.  D-dimer 0.54.  COVID-19/influenza PCR checked today were found to be negative.  Imaging and additional notable ED work-up: EKG shows sinus tachycardia with heart rate 105, interventricular conduction delay, no evidence of T wave or ST changes.  Chest x-ray showed no evidence of acute cardiopulmonary process, although did note aortic atherosclerotic disease.  EDP at [facility] discussed the patient's case with cardiology, who recommended admission to the hospitalist service at [facility] for further evaluation no chest pain, with cardiology conveying that they will formally consult in the morning, with possibility of recommending stress test at that time.  While in the ED, the following were administered: Full dose aspirin x1, Xanax x1.     Review of Systems: As per HPI otherwise 10 point review of systems negative.   Past Medical History:  Diagnosis Date   COPD (chronic obstructive pulmonary disease) ([place])    Hypercholesteremia    Hypertension     History reviewed. No pertinent surgical history.  Social History:  reports that she has been smoking cigarettes. She has been smoking an average of .5 packs per day. She has never used smokeless tobacco. She reports that she does not  drink alcohol and does not use drugs.   Allergies  Allergen Reactions   Talwin [Pentazocine] Other (See Comments)    hallucinations    History reviewed. No pertinent family history.   Prior to Admission medications   Medication Sig Start Date End Date Taking? Authorizing Provider  albuterol (VENTOLIN HFA) 108 (90 Base) MCG/ACT inhaler  07/27/18  Yes [provider]  ALPRAZolam (XANAX) 0.5 MG tablet TAKE ONE TABLET BY MOUTH 3 TIMES DAILY AS NEEDED FOR ANXIETY. 01/04/21  Yes Cottle, Billey Co., MD  amLODipine (NORVASC) 5 MG tablet Take 5 mg by mouth daily. 06/08/18  Yes [provider]  Biotin 5000 MCG TABS Take 5,000 mcg by mouth daily.   Yes [provider]  Cholecalciferol (VITAMIN D3) 125 MCG (5000 UT) TABS Take 5,000 Units by mouth daily.   Yes [provider]  cholestyramine Lucrezia Starch) 4 GM/DOSE powder Take by mouth See admin instructions. 13 grams added to 2-6 oz of water as needed   Yes [provider]  Fluticasone-Umeclidin-Vilant (TRELEGY ELLIPTA) 100-62.5-25 MCG/ACT AEPB Inhale 1 puff into the lungs daily.   Yes [provider]  Garlic (GARLIQUE PO) Take 1 tablet by mouth daily.   Yes [provider]  losartan (COZAAR) 100 MG tablet Take 100 mg by mouth daily. 06/08/18  Yes [provider]  melatonin 5 MG TABS Take 5 mg by mouth at bedtime.   Yes [provider]  risperiDONE (RISPERDAL) 1 MG tablet TAKE 1 TABLET BY MOUTH AT BEDTIME. FOR MOOD 01/04/21  Yes Cottle, Billey Co., MD  rosuvastatin (CRESTOR) 20 MG tablet Take 20 mg by mouth daily.   Yes [provider]  traZODone (DESYREL) 100 MG tablet 1 to 2 tablets at night as needed for sleep 01/04/21  Yes Cottle, Billey Co., MD  venlafaxine XR (EFFEXOR-XR) 75 MG 24 hr capsule Take 3 capsules (225 mg total) by mouth daily. 01/04/21  Yes Cottle, Billey Co., MD  Celedonio Miyamoto 62.5-25 MCG/INH AEPB  05/12/18   [provider]   diphenhydrAMINE (BENADRYL) 25 mg capsule Take 25 mg by mouth every 6 (six) hours as needed.    [provider]     Objective    Physical Exam: Vitals:   02/22/21 1900 02/22/21 1945 02/22/21 2014 02/22/21 2133  BP: (!) 132/57 140/66 (!) 148/66 138/68  Pulse: (!) 108 (!) 102 (!) 111 (!) 110  Resp: 15 18 18 19   Temp:    99.1 F (37.3 C)  TempSrc:    Oral  SpO2: 92% 92% 95% 93%  Weight:    62.9 kg  Height:    5\' 2"  (1.575 m)    General: appears to be stated age; alert, oriented Skin: warm, dry, no rash Head:  AT/[place] Mouth:  Oral mucosa membranes appear moist, normal dentition Neck: supple; trachea midline Heart:  RRR; did not appreciate any M/R/G Lungs: CTAB, did not appreciate any wheezes, rales, or rhonchi Abdomen: + BS; soft, ND, NT Vascular: 2+ pedal pulses b/l; 2+ radial pulses b/l Extremities: no peripheral edema, no muscle wasting Neuro: strength and sensation intact in upper and lower extremities b/l    Labs on Admission: I have personally reviewed following labs and imaging studies  CBC: Recent Labs  Lab 02/21/21 1956 02/22/21 1514  WBC 10.7* 7.8  NEUTROABS  --  5.3  HGB 13.5 14.1  HCT 41.0 41.9  MCV 91.9 88.8  PLT 217 092   Basic Metabolic Panel: Recent Labs  Lab 02/21/21 1956 02/22/21 1514  NA 138 138  K 3.6 3.4*  CL 102 101  CO2 29 27  GLUCOSE 119* 113*  BUN 5* 8  CREATININE 0.85 0.80  CALCIUM 9.1 9.8   GFR: Estimated Creatinine Clearance: 53.8 mL/min (by C-G formula based on SCr of 0.8 mg/dL). Liver Function Tests: Recent Labs  Lab 02/22/21 1514  AST 18  ALT 15  ALKPHOS 56  BILITOT 0.5  PROT 7.0  ALBUMIN 4.6   No results for input(s): LIPASE, AMYLASE in the last 168 hours. No results for input(s): AMMONIA in the last 168 hours. Coagulation Profile: No results for input(s): INR, PROTIME in the last 168 hours. Cardiac Enzymes: No results for input(s): CKTOTAL, CKMB, CKMBINDEX, TROPONINI in the last 168 hours. BNP (last  3 results) No results for input(s): PROBNP in the last 8760 hours. HbA1C: No results for input(s): HGBA1C in the last 72 hours. CBG: No results for input(s): GLUCAP in the last 168 hours. Lipid Profile: No results for input(s): CHOL, HDL, LDLCALC, TRIG, CHOLHDL, LDLDIRECT in the last 72 hours. Thyroid Function Tests: No results for input(s): TSH, T4TOTAL, FREET4, T3FREE, THYROIDAB in the last 72 hours. Anemia Panel: No results for input(s): VITAMINB12, FOLATE, FERRITIN, TIBC, IRON, RETICCTPCT in the last 72 hours. Urine analysis:    Component Value Date/Time   COLORURINE YELLOW 07/30/2008 1001   APPEARANCEUR CLEAR 07/30/2008 1001   LABSPEC 1.001 (L) 07/30/2008 1001   PHURINE 7.5 07/30/2008 1001   GLUCOSEU NEGATIVE 07/30/2008 1001   HGBUR NEGATIVE 07/30/2008 1001   BILIRUBINUR NEGATIVE 07/30/2008 1001   KETONESUR NEGATIVE 07/30/2008 1001   PROTEINUR NEGATIVE 07/30/2008 1001   UROBILINOGEN 0.2 07/30/2008 1001   NITRITE NEGATIVE 07/30/2008 1001   LEUKOCYTESUR  07/30/2008 1001    NEGATIVE MICROSCOPIC NOT DONE ON URINES WITH NEGATIVE PROTEIN, BLOOD, LEUKOCYTES, NITRITE, OR GLUCOSE <1000 mg/dL.    Radiological Exams on Admission: DG Chest 1 View  Result Date: 02/21/2021 CLINICAL DATA:  Chest pain. EXAM: CHEST  1 VIEW COMPARISON:  November 01, 2019 FINDINGS: Low lung volumes are seen with mild, stable atelectasis noted within the lateral aspect of the left lung base. There is no evidence of acute infiltrate, pleural effusion or pneumothorax. The heart size and mediastinal contours are within normal limits. Moderate severity calcification of the aortic arch is noted. The visualized skeletal structures are unremarkable. IMPRESSION: Low lung volumes with mild, stable left basilar atelectasis. Electronically Signed   By: Virgina Norfolk M.D.   On: 02/21/2021 20:25   DG Chest Portable 1 View  Result Date: 02/22/2021 CLINICAL DATA:  75 year old female with history of shortness of breath.  EXAM: PORTABLE CHEST 1 VIEW COMPARISON:  Chest x-ray 02/21/2021. FINDINGS: Mild chronic scarring in the left lung base. Lung volumes are normal. No acute consolidative airspace disease. No pleural effusions. No suspicious appearing pulmonary nodules or masses are noted. No evidence of pulmonary edema. Heart size is normal. Upper mediastinal contours are within normal limits. Atherosclerotic calcifications are noted in the thoracic aorta. IMPRESSION: 1. No radiographic evidence of acute cardiopulmonary disease. 2. Aortic atherosclerosis. Electronically Signed   By: Vinnie Langton M.D.   On: 02/22/2021 15:34     EKG: Independently reviewed, with result as described above.    Assessment/Plan   Principal Problem:   Chest pain  Active Problems:   Hypokalemia   Hypertension   Hypercholesteremia   COPD (chronic obstructive pulmonary disease) (HCC)   GAD (generalized anxiety disorder)    #) Chest pain: Exertional substernal chest pressure associated with shortness of breath, with very mild elevation of troponin, as further detailed above.  Currently chest pain-free.  Chest x-ray showed no evidence of acute cardiopulmonary process, but did note aortic atherosclerotic disease, increasing likelihood of some degree of underlying CAD, although no formal diagnosis of such currently exists.  Most recent cardiac stress described 15 years ago and reportedly demonstrated no evidence of reversible ischemia at that time.  Presenting EKG shows no evidence of acute ischemic changes, including no evidence of ST elevation.   Patient's case was discussed with cardiology, who recommended admission to the hospitalist service at [facility] for further evaluation no chest pain, with cardiology conveying that they will formally consult in the morning, with possibility of recommending stress test at that time.  acute pulmonary embolism is very unlikely in the setting of a nonelevated age-adjusted D-dimer.  Chest x-ray  showed no evidence of pneumothorax.  Continue to trend serial troponin, closely monitor on telemetry, and follow-up for additional recommendations per formal cardiology consultation, as above.  Of note, full dose aspirin administered at [facility] earlier today.    Plan: trend serial troponin. Monitor on telemetry. PRN sublingual nitroglycerin.  Potassium chloride 40 mill equivalents p.o. x1 dose now.  Check serum Mg level and repeat BMP in the morning.CMP and CBC in the AM.  Check lipase.  Cardiology consult, as above.  Continue outpatient high intensity rosuvastatin as well as losartan.      #) Hypokalemia: Presenting serum potassium 3.3 in the setting of presenting chest pain, will provide supplementation, as detailed below.  Plan: Potassium chloride 40 mEq p.o. x1 dose now.  Repeat BMP in the morning.  Add on serum magnesium level.  Monitor on telemetry.       #) Essential Hypertension: documented history of such, with outpatient antihypertensive regimen including Norvasc, losartan.  Systolic blood pressures pressure in the ED today 130s to 150s.   Plan: Close monitoring of subsequent blood pressure via routine vital signs.  Resume home losartan.      #) Hyperlipidemia: documented h/o such. On high intensity rosuvastatin as outpatient.    Plan: continue home statin, with next dose to occur now, particularly in setting of presenting chest pain.       #) COPD: In the setting of a long smoking history for which the patient is a current smoker.  Outpatient respiratory regimen includes Trelegy Ellipta as well as as needed albuterol.  No evidence to suggest acute COPD exacerbation at this time.  Plan: Continue home respiratory regimen.  Repeat BMP in the morning.       #) Generalized anxiety disorder: On Effexor as well as as needed Xanax as an outpatient.  Differential for the patient's presenting chest pain includes GAD.  Consequently, will resume home regimen.  Plan:  Resume home Effexor and as needed Xanax.      #) Chronic tobacco abuse: Patient knowledges that she is a current smoker, having smoked half pack per day for greater than 20 years.  Plan: Counseled the patient on the importance of complete smoking discontinuation in particular in setting of known COPD as well as chest x-ray evidence of aortic atherosclerotic disease..  Nicotine patch ordered for use during this hospitalization.        DVT prophylaxis: SCD's   Code  Status: Full code Family Communication: none Disposition Plan: Per Rounding Team Consults called:  cardiology consulted, as further detailed above;  Admission status: Observation; cardiac telemetry   PLEASE NOTE THAT DRAGON DICTATION SOFTWARE WAS USED IN THE CONSTRUCTION OF THIS NOTE.   [place] DO [facility]  From [place]   02/22/2021, 9:39 PM

## 2021-02-22 NOTE — ED Notes (Signed)
ED Provider at bedside. 

## 2021-02-22 NOTE — ED Notes (Signed)
[facility] at bedside to transport  

## 2021-02-22 NOTE — ED Triage Notes (Addendum)
Pt sent to [facility] by pcp yest, pt left [facility] after waiting until 0300.  Pt here now  with c/o shortness of breath, right shoulder pain.  Increase trop at pcp yest.  States intermittent CP for 2 months.   States productive cough

## 2021-02-22 NOTE — ED Notes (Signed)
[facility] called for report ETA 10 min. Pt stable for transport, resting comfortably

## 2021-02-22 NOTE — ED Notes (Signed)
Per provider, ( Dr. Pearline Cables) pt stable to go POV when bed is ready

## 2021-02-22 NOTE — ED Notes (Signed)
Report called to [place], RN on [place].

## 2021-02-22 NOTE — ED Notes (Signed)
Radiology at bedside for port chest xray 

## 2021-02-22 NOTE — ED Notes (Signed)
Covid test discontin.  Pt had negative test yesterday with [facility]

## 2021-02-22 NOTE — ED Notes (Signed)
Called [facility] to transport patient to [place] Room 14

## 2021-02-22 NOTE — ED Notes (Signed)
Pt left. Pt was advised and encouraged to stay but they wanted to go home.

## 2021-02-22 NOTE — ED Provider Notes (Signed)
  Provider Note MRN:  [id number]  Arrival date & time: 02/22/21    ED Course and Medical Decision Making  Assumed care from Dr Alvino Chapel at shift change.  See not from prior team for complete details, in brief: cp/dib, recent troponin leak 18-20. EKG stable. CMP pending, trop stable today, CXR stable. Getting duoneb. Ongoing chest pain.   Plan per prior physician favor admission given ongoing pain  Patient has been having ongoing exertional chest pain, with difficulty breathing.  Pain described as a pressure, heaviness. Mild elevation to troponin.  EKG with some nonspecific disturbance. Patient has a heart score of 6.  Will d/w cardiology.  At this time would recommend admission and cardiology evaluation, stress testing. Pt and family agreeable.   Given ASA  Discussed with cardiologist  Dr Debara Pickett, they agree with plan, will see patient in consult likely tomorrow morning. Recommend admit to hospitalist service.   Cardiac monitoring reviewed by myself, shows NSR  .Critical Care Performed by: Jeanell Sparrow, DO Authorized by: Jeanell Sparrow, DO   Critical care provider statement:    Critical care time (minutes):  30   Critical care time was exclusive of:  Separately billable procedures and treating other patients   Critical care was necessary to treat or prevent imminent or life-threatening deterioration of the following conditions:  Cardiac failure   Critical care was time spent personally by me on the following activities:  Development of treatment plan with patient or surrogate, discussions with consultants, evaluation of patient's response to treatment, examination of patient, ordering and review of laboratory studies, ordering and review of radiographic studies, ordering and performing treatments and interventions, pulse oximetry, re-evaluation of patient's condition and review of old charts   Care discussed with: admitting provider   Comments:     nstemi  Final Clinical Impressions(s)  / ED Diagnoses     ICD-10-CM   1. Moderate risk chest pain  R07.9     2. NSTEMI (non-ST elevated myocardial infarction) [facility])  I21.4       ED Discharge Orders     None       Discharge Instructions   None         Jeanell Sparrow, DO 02/22/21 2321

## 2021-02-23 ENCOUNTER — Observation Stay (HOSPITAL_COMMUNITY): Payer: PPO

## 2021-02-23 ENCOUNTER — Observation Stay (HOSPITAL_BASED_OUTPATIENT_CLINIC_OR_DEPARTMENT_OTHER): Payer: PPO

## 2021-02-23 DIAGNOSIS — Z720 Tobacco use: Secondary | ICD-10-CM

## 2021-02-23 DIAGNOSIS — E7800 Pure hypercholesterolemia, unspecified: Secondary | ICD-10-CM

## 2021-02-23 DIAGNOSIS — I10 Essential (primary) hypertension: Secondary | ICD-10-CM | POA: Diagnosis not present

## 2021-02-23 DIAGNOSIS — F411 Generalized anxiety disorder: Secondary | ICD-10-CM | POA: Diagnosis present

## 2021-02-23 DIAGNOSIS — R079 Chest pain, unspecified: Secondary | ICD-10-CM

## 2021-02-23 DIAGNOSIS — J449 Chronic obstructive pulmonary disease, unspecified: Secondary | ICD-10-CM

## 2021-02-23 DIAGNOSIS — E876 Hypokalemia: Secondary | ICD-10-CM | POA: Diagnosis not present

## 2021-02-23 LAB — LIPASE, BLOOD: Lipase: 19 U/L (ref 11–51)

## 2021-02-23 LAB — ECHOCARDIOGRAM COMPLETE
AR max vel: 2.03 cm2
AV Area VTI: 1.92 cm2
AV Area mean vel: 1.89 cm2
AV Mean grad: 3 mmHg
AV Peak grad: 6 mmHg
Ao pk vel: 1.22 m/s
Area-P 1/2: 4.06 cm2
Calc EF: 27.3 %
Height: 62 in
S' Lateral: 4 cm
Single Plane A2C EF: 33.6 %
Single Plane A4C EF: 11.8 %
Weight: 2218.71 oz

## 2021-02-23 LAB — COMPREHENSIVE METABOLIC PANEL
ALT: 16 U/L (ref 0–44)
AST: 18 U/L (ref 15–41)
Albumin: 3.1 g/dL — ABNORMAL LOW (ref 3.5–5.0)
Alkaline Phosphatase: 50 U/L (ref 38–126)
Anion gap: 7 (ref 5–15)
BUN: 7 mg/dL — ABNORMAL LOW (ref 8–23)
CO2: 26 mmol/L (ref 22–32)
Calcium: 8.2 mg/dL — ABNORMAL LOW (ref 8.9–10.3)
Chloride: 105 mmol/L (ref 98–111)
Creatinine, Ser: 0.83 mg/dL (ref 0.44–1.00)
GFR, Estimated: >60 mL/min (ref >60)
Glucose, Bld: 109 mg/dL — ABNORMAL HIGH (ref 70–99)
Potassium: 3.6 mmol/L (ref 3.5–5.1)
Sodium: 138 mmol/L (ref 135–145)
Total Bilirubin: 0.2 mg/dL — ABNORMAL LOW (ref 0.3–1.2)
Total Protein: 5.4 g/dL — ABNORMAL LOW (ref 6.5–8.1)

## 2021-02-23 LAB — CBC
HCT: 36.7 % (ref 36.0–46.0)
Hemoglobin: 12.5 g/dL (ref 12.0–15.0)
MCH: 30.1 pg (ref 26.0–34.0)
MCHC: 34.1 g/dL (ref 30.0–36.0)
MCV: 88.4 fL (ref 80.0–100.0)
Platelets: 197 10*3/uL (ref 150–400)
RBC: 4.15 MIL/uL (ref 3.87–5.11)
RDW: 12.9 % (ref 11.5–15.5)
WBC: 7.8 10*3/uL (ref 4.0–10.5)
nRBC: 0 % (ref 0.0–0.2)

## 2021-02-23 LAB — MAGNESIUM: Magnesium: 1.6 mg/dL — ABNORMAL LOW (ref 1.7–2.4)

## 2021-02-23 LAB — NM MYOCAR MULTI W/SPECT W/WALL MOTION / EF
Peak HR: 99 {beats}/min
Rest HR: 85 {beats}/min
ST Depression (mm): 0 mm

## 2021-02-23 LAB — TROPONIN I (HIGH SENSITIVITY)
Troponin I (High Sensitivity): 22 ng/L — ABNORMAL HIGH (ref <18)
Troponin I (High Sensitivity): 24 ng/L — ABNORMAL HIGH (ref <18)

## 2021-02-23 LAB — MRSA NEXT GEN BY PCR, NASAL: MRSA by PCR Next Gen: NOT DETECTED

## 2021-02-23 MED ORDER — TECHNETIUM TC 99M TETROFOSMIN IV KIT
11.0000 | PACK | Freq: Once | INTRAVENOUS | Status: AC | PRN
  Administered 2021-02-23: 11 via INTRAVENOUS

## 2021-02-23 MED ORDER — TECHNETIUM TC 99M TETROFOSMIN IV KIT
30.1000 | PACK | Freq: Once | INTRAVENOUS | Status: AC | PRN
  Administered 2021-02-23: 30.1 via INTRAVENOUS

## 2021-02-23 MED ORDER — REGADENOSON 0.4 MG/5ML IV SOLN
0.4000 mg | Freq: Once | INTRAVENOUS | Status: AC
  Filled 2021-02-23: qty 5

## 2021-02-23 MED ORDER — MAGNESIUM SULFATE 2 GM/50ML IV SOLN
2.0000 g | Freq: Once | INTRAVENOUS | Status: AC
  Administered 2021-02-23: 2 g via INTRAVENOUS
  Filled 2021-02-23: qty 50

## 2021-02-23 MED ORDER — REGADENOSON 0.4 MG/5ML IV SOLN
INTRAVENOUS | Status: AC
  Administered 2021-02-23: 0.4 mg via INTRAVENOUS
  Filled 2021-02-23: qty 5

## 2021-02-23 MED ORDER — REGADENOSON 0.4 MG/5ML IV SOLN: 0.4000 mg | Freq: Once | INTRAVENOUS | Status: DC

## 2021-02-23 MED ORDER — ASPIRIN EC 81 MG PO TBEC: 81.0000 mg | DELAYED_RELEASE_TABLET | Freq: Every day | ORAL | Status: DC

## 2021-02-23 NOTE — Discharge Summary (Signed)
Physician Discharge Summary  Melinda Gomez OBS:[id number] DOB: Mar 24, 1946 DOA: 02/22/2021  PCP: Lavone Orn, MD  Admit date: 02/22/2021 Discharge date: 02/23/2021  Admitted From: Home Disposition:  Home  Recommendations for Outpatient Follow-up:  Follow up with PCP in 1-2 weeks Please obtain BMP/CBC in one week Please follow up with cardiology as scheduled  Discharge Condition:Stable  CODE STATUS:Full  Diet recommendation: Low salt, low fat diet    Brief/Interim Summary: Melinda Gomez is a 74 y.o. female with medical history significant for hypertension, hyperlipidemia, COPD, GERD, generalized anxiety disorder, chronic tobacco abuse, who is admitted to [facility] on 02/22/2021 for further evaluation of chest pain - it was reported as substernal nonradiating pressure at rest - cardiology consulted at intake recommending lexiscan which was found to be low risk. Patient otherwise stable and agreeable for discharge.   Assessment & Plan:  Atypical Chest pain; ACS ruled out Hypokalemia, mild, resolved HTN- continue Losartan HLD - statin ongoing  COPD - not acutely exacerbated  Anxiety - effexor/xanax ongoing Tobacco abuse - lengthy discussion about cessation at bedside   Discharge Instructions  Discharge Instructions     Discharge patient   Complete by: As directed    Discharge disposition: 01-Home or Self Care   Discharge patient date: 02/23/2021      Allergies as of 02/23/2021       Reactions   Talwin [pentazocine] Other (See Comments)   hallucinations        Medication List     STOP taking these medications    Anoro Ellipta 62.5-25 MCG/ACT Aepb Generic drug: umeclidinium-vilanterol       TAKE these medications    albuterol 108 (90 Base) MCG/ACT inhaler Commonly known as: VENTOLIN HFA   ALPRAZolam 0.5 MG tablet Commonly known as: XANAX TAKE ONE TABLET BY MOUTH 3 TIMES DAILY AS NEEDED FOR ANXIETY.   amLODipine 5 MG tablet Commonly known as:  NORVASC Take 5 mg by mouth daily.   Biotin 5000 MCG Tabs Take 5,000 mcg by mouth daily.   cholestyramine 4 GM/DOSE powder Commonly known as: QUESTRAN Take by mouth See admin instructions. 13 grams added to 2-6 oz of water as needed   diphenhydrAMINE 25 mg capsule Commonly known as: BENADRYL Take 25 mg by mouth every 6 (six) hours as needed.   GARLIQUE PO Take 1 tablet by mouth daily.   losartan 100 MG tablet Commonly known as: COZAAR Take 100 mg by mouth daily.   melatonin 5 MG Tabs Take 5 mg by mouth at bedtime.   risperiDONE 1 MG tablet Commonly known as: RISPERDAL TAKE 1 TABLET BY MOUTH AT BEDTIME. FOR MOOD   rosuvastatin 20 MG tablet Commonly known as: CRESTOR Take 20 mg by mouth daily.   traZODone 100 MG tablet Commonly known as: DESYREL 1 to 2 tablets at night as needed for sleep   Trelegy Ellipta 100-62.5-25 MCG/ACT Aepb Generic drug: Fluticasone-Umeclidin-Vilant Inhale 1 puff into the lungs daily.   venlafaxine XR 75 MG 24 hr capsule Commonly known as: EFFEXOR-XR Take 3 capsules (225 mg total) by mouth daily.   Vitamin D3 125 MCG (5000 UT) Tabs Take 5,000 Units by mouth daily.        Allergies  Allergen Reactions   Talwin [Pentazocine] Other (See Comments)    hallucinations    Consultations: Cardiology  Procedures/Studies: DG Chest 1 View  Result Date: 02/21/2021 CLINICAL DATA:  Chest pain. EXAM: CHEST  1 VIEW COMPARISON:  November 01, 2019 FINDINGS: Low lung volumes  are seen with mild, stable atelectasis noted within the lateral aspect of the left lung base. There is no evidence of acute infiltrate, pleural effusion or pneumothorax. The heart size and mediastinal contours are within normal limits. Moderate severity calcification of the aortic arch is noted. The visualized skeletal structures are unremarkable. IMPRESSION: Low lung volumes with mild, stable left basilar atelectasis. Electronically Signed   By: Virgina Norfolk M.D.   On: 02/21/2021  20:25   NM Myocar Multi W/Spect Melinda Gomez Motion / EF  Result Date: 02/23/2021 CLINICAL DATA:  75 year old with chest pain. EXAM: MYOCARDIAL IMAGING WITH SPECT (REST AND PHARMACOLOGIC-STRESS) GATED LEFT VENTRICULAR WALL MOTION STUDY LEFT VENTRICULAR EJECTION FRACTION TECHNIQUE: Standard myocardial SPECT imaging was performed after resting intravenous injection of 11 mCi Tc-32m tetrofosmin. Subsequently, intravenous infusion of Lexiscan was performed under the supervision of the Cardiology staff. At peak effect of the drug, 30.1 mCi Tc-24m tetrofosmin was injected intravenously and standard myocardial SPECT imaging was performed. Quantitative gated imaging was also performed to evaluate left ventricular wall motion, and estimate left ventricular ejection fraction. COMPARISON:  None. FINDINGS: Perfusion: Decreased uptake along the apex on both the rest and stress imaging could represent a small infarct. There is no evidence for reversible ischemia. Wall Motion: Abnormal left ventricular wall motion with diffuse hypokinesia. Difficult to exclude areas of paradoxical motion particularly along the septal and inferior walls. Left Ventricular Ejection Fraction: 29 % End diastolic volume 710 ml End systolic volume 92 ml IMPRESSION: 1. No reversible ischemia.  Possible infarct involving the apex. 2. Abnormal left ventricular wall motion with diffuse hypokinesia. Difficult to exclude areas of paradoxical motion. 3. Left ventricular ejection fraction is 29%. 4. Non invasive risk stratification*: Low *2012 Appropriate Use Criteria for Coronary Revascularization Focused Update: J Am Coll Cardiol. 6269;48(5):462-703. http://content.airportbarriers.com.aspx?articleid=[email or web address] Electronically Signed   By: Markus Daft M.D.   On: 02/23/2021 18:12   DG Chest Portable 1 View  Result Date: 02/22/2021 CLINICAL DATA:  75 year old female with history of shortness of breath. EXAM: PORTABLE CHEST 1 VIEW COMPARISON:  Chest x-ray  02/21/2021. FINDINGS: Mild chronic scarring in the left lung base. Lung volumes are normal. No acute consolidative airspace disease. No pleural effusions. No suspicious appearing pulmonary nodules or masses are noted. No evidence of pulmonary edema. Heart size is normal. Upper mediastinal contours are within normal limits. Atherosclerotic calcifications are noted in the thoracic aorta. IMPRESSION: 1. No radiographic evidence of acute cardiopulmonary disease. 2. Aortic atherosclerosis. Electronically Signed   By: Vinnie Langton M.D.   On: 02/22/2021 15:34     Subjective: No acute issues/events overnight   Discharge Exam: Vitals:   02/23/21 1335 02/23/21 1548  BP: (!) 150/54 (!) 115/55  Pulse: 95 85  Resp: (!) 31 19  Temp:  97.7 F (36.5 C)  SpO2: 93% 92%   Vitals:   02/23/21 1333 02/23/21 1334 02/23/21 1335 02/23/21 1548  BP:  (!) 148/51 (!) 150/54 (!) 115/55  Pulse: 98  95 85  Resp: (!) 26 (!) 25 (!) 31 19  Temp:    97.7 F (36.5 C)  TempSrc:    Oral  SpO2: 95%  93% 92%  Weight:      Height:        General: Pt is alert, awake, not in acute distress Cardiovascular: RRR, S1/S2 +, no rubs, no gallops Respiratory: CTA bilaterally, no wheezing, no rhonchi Abdominal: Soft, NT, ND, bowel sounds + Extremities: no edema, no cyanosis    The results of significant diagnostics from this  hospitalization (including imaging, microbiology, ancillary and laboratory) are listed below for reference.     Microbiology: Recent Results (from the past 240 hour(s))  Resp Panel by RT-PCR (Flu A&B, Covid) Nasopharyngeal Swab     Status: None   Collection Time: 02/21/21  7:37 PM   Specimen: Nasopharyngeal Swab; Nasopharyngeal(NP) swabs in vial transport medium  Result Value Ref Range Status   SARS Coronavirus 2 by RT PCR NEGATIVE NEGATIVE Final    Comment: (NOTE) SARS-CoV-2 target nucleic acids are NOT DETECTED.  The SARS-CoV-2 RNA is generally detectable in upper respiratory specimens  during the acute phase of infection. The lowest concentration of SARS-CoV-2 viral copies this assay can detect is 138 copies/mL. A negative result does not preclude SARS-Cov-2 infection and should not be used as the sole basis for treatment or other patient management decisions. A negative result may occur with  improper specimen collection/handling, submission of specimen other than nasopharyngeal swab, presence of viral mutation(s) within the areas targeted by this assay, and inadequate number of viral copies(<138 copies/mL). A negative result must be combined with clinical observations, patient history, and epidemiological information. The expected result is Negative.  Fact Sheet for Patients:  EntrepreneurPulse.com.au  Fact Sheet for Healthcare Providers:  IncredibleEmployment.be  This test is no t yet approved or cleared by the [place] FDA and  has been authorized for detection and/or diagnosis of SARS-CoV-2 by FDA under an Emergency Use Authorization (EUA). This EUA will remain  in effect (meaning this test can be used) for the duration of the COVID-19 declaration under Section 564(b)(1) of the Act, 21 U.S.C.section 360bbb-3(b)(1), unless the authorization is terminated  or revoked sooner.       Influenza A by PCR NEGATIVE NEGATIVE Final   Influenza B by PCR NEGATIVE NEGATIVE Final    Comment: (NOTE) The Xpert Xpress SARS-CoV-2/FLU/RSV plus assay is intended as an aid in the diagnosis of influenza from Nasopharyngeal swab specimens and should not be used as a sole basis for treatment. Nasal washings and aspirates are unacceptable for Xpert Xpress SARS-CoV-2/FLU/RSV testing.  Fact Sheet for Patients: EntrepreneurPulse.com.au  Fact Sheet for Healthcare Providers: IncredibleEmployment.be  This test is not yet approved or cleared by the [place] FDA and has been authorized for detection  and/or diagnosis of SARS-CoV-2 by FDA under an Emergency Use Authorization (EUA). This EUA will remain in effect (meaning this test can be used) for the duration of the COVID-19 declaration under Section 564(b)(1) of the Act, 21 U.S.C. section 360bbb-3(b)(1), unless the authorization is terminated or revoked.  Performed at [facility], [place] [address], [place], [place] [address]   MRSA Next Gen by PCR, Nasal     Status: None   Collection Time: 02/23/21 12:34 AM   Specimen: Nasal Mucosa; Nasal Swab  Result Value Ref Range Status   MRSA by PCR Next Gen NOT DETECTED NOT DETECTED Final    Comment: (NOTE) The GeneXpert MRSA Assay (FDA approved for NASAL specimens only), is one component of a comprehensive MRSA colonization surveillance program. It is not intended to diagnose MRSA infection nor to guide or monitor treatment for MRSA infections. Test performance is not FDA approved in patients less than 22 years old. Performed at [facility], [place] [address], [place], [place] [address]      Labs: BNP (last 3 results) Recent Labs    02/21/21 1956 02/22/21 1514  BNP 139.6* 433.2*   Basic Metabolic Panel: Recent Labs  Lab 02/21/21 1956 02/22/21 1514  02/23/21 0051  NA 138 138 138  K 3.6 3.4* 3.6  CL 102 101 105  CO2 29 27 26   GLUCOSE 119* 113* 109*  BUN 5* 8 7*  CREATININE 0.85 0.80 0.83  CALCIUM 9.1 9.8 8.2*  MG  --   --  1.6*   Liver Function Tests: Recent Labs  Lab 02/22/21 1514 02/23/21 0051  AST 18 18  ALT 15 16  ALKPHOS 56 50  BILITOT 0.5 0.2*  PROT 7.0 5.4*  ALBUMIN 4.6 3.1*   Recent Labs  Lab 02/23/21 0051  LIPASE 19   No results for input(s): AMMONIA in the last 168 hours. CBC: Recent Labs  Lab 02/21/21 1956 02/22/21 1514 02/23/21 0051  WBC 10.7* 7.8 7.8  NEUTROABS  --  5.3  --   HGB 13.5 14.1 12.5  HCT 41.0 41.9 36.7  MCV 91.9 88.8 88.4  PLT 217 210 197   Cardiac Enzymes: No results for input(s): CKTOTAL, CKMB, CKMBINDEX,  TROPONINI in the last 168 hours. BNP: Invalid input(s): POCBNP CBG: No results for input(s): GLUCAP in the last 168 hours. D-Dimer Recent Labs    02/22/21 1514  DDIMER 0.54*   Hgb A1c No results for input(s): HGBA1C in the last 72 hours. Lipid Profile No results for input(s): CHOL, HDL, LDLCALC, TRIG, CHOLHDL, LDLDIRECT in the last 72 hours. Thyroid function studies No results for input(s): TSH, T4TOTAL, T3FREE, THYROIDAB in the last 72 hours.  Invalid input(s): FREET3 Anemia work up No results for input(s): VITAMINB12, FOLATE, FERRITIN, TIBC, IRON, RETICCTPCT in the last 72 hours. Urinalysis    Component Value Date/Time   COLORURINE YELLOW 07/30/2008 1001   APPEARANCEUR CLEAR 07/30/2008 1001   LABSPEC 1.001 (L) 07/30/2008 1001   PHURINE 7.5 07/30/2008 1001   GLUCOSEU NEGATIVE 07/30/2008 1001   HGBUR NEGATIVE 07/30/2008 1001   BILIRUBINUR NEGATIVE 07/30/2008 1001   KETONESUR NEGATIVE 07/30/2008 1001   PROTEINUR NEGATIVE 07/30/2008 1001   UROBILINOGEN 0.2 07/30/2008 1001   NITRITE NEGATIVE 07/30/2008 1001   LEUKOCYTESUR  07/30/2008 1001    NEGATIVE MICROSCOPIC NOT DONE ON URINES WITH NEGATIVE PROTEIN, BLOOD, LEUKOCYTES, NITRITE, OR GLUCOSE <1000 mg/dL.   Sepsis Labs Invalid input(s): PROCALCITONIN,  WBC,  LACTICIDVEN Microbiology Recent Results (from the past 240 hour(s))  Resp Panel by RT-PCR (Flu A&B, Covid) Nasopharyngeal Swab     Status: None   Collection Time: 02/21/21  7:37 PM   Specimen: Nasopharyngeal Swab; Nasopharyngeal(NP) swabs in vial transport medium  Result Value Ref Range Status   SARS Coronavirus 2 by RT PCR NEGATIVE NEGATIVE Final    Comment: (NOTE) SARS-CoV-2 target nucleic acids are NOT DETECTED.  The SARS-CoV-2 RNA is generally detectable in upper respiratory specimens during the acute phase of infection. The lowest concentration of SARS-CoV-2 viral copies this assay can detect is 138 copies/mL. A negative result does not preclude  SARS-Cov-2 infection and should not be used as the sole basis for treatment or other patient management decisions. A negative result may occur with  improper specimen collection/handling, submission of specimen other than nasopharyngeal swab, presence of viral mutation(s) within the areas targeted by this assay, and inadequate number of viral copies(<138 copies/mL). A negative result must be combined with clinical observations, patient history, and epidemiological information. The expected result is Negative.  Fact Sheet for Patients:  EntrepreneurPulse.com.au  Fact Sheet for Healthcare Providers:  IncredibleEmployment.be  This test is no t yet approved or cleared by the [place] FDA and  has been authorized for detection and/or diagnosis  of SARS-CoV-2 by FDA under an Emergency Use Authorization (EUA). This EUA will remain  in effect (meaning this test can be used) for the duration of the COVID-19 declaration under Section 564(b)(1) of the Act, 21 U.S.C.section 360bbb-3(b)(1), unless the authorization is terminated  or revoked sooner.       Influenza A by PCR NEGATIVE NEGATIVE Final   Influenza B by PCR NEGATIVE NEGATIVE Final    Comment: (NOTE) The Xpert Xpress SARS-CoV-2/FLU/RSV plus assay is intended as an aid in the diagnosis of influenza from Nasopharyngeal swab specimens and should not be used as a sole basis for treatment. Nasal washings and aspirates are unacceptable for Xpert Xpress SARS-CoV-2/FLU/RSV testing.  Fact Sheet for Patients: EntrepreneurPulse.com.au  Fact Sheet for Healthcare Providers: IncredibleEmployment.be  This test is not yet approved or cleared by the [place] FDA and has been authorized for detection and/or diagnosis of SARS-CoV-2 by FDA under an Emergency Use Authorization (EUA). This EUA will remain in effect (meaning this test can be used) for the duration of  the COVID-19 declaration under Section 564(b)(1) of the Act, 21 U.S.C. section 360bbb-3(b)(1), unless the authorization is terminated or revoked.  Performed at [facility], [place] [address], [place], [place] [address]   MRSA Next Gen by PCR, Nasal     Status: None   Collection Time: 02/23/21 12:34 AM   Specimen: Nasal Mucosa; Nasal Swab  Result Value Ref Range Status   MRSA by PCR Next Gen NOT DETECTED NOT DETECTED Final    Comment: (NOTE) The GeneXpert MRSA Assay (FDA approved for NASAL specimens only), is one component of a comprehensive MRSA colonization surveillance program. It is not intended to diagnose MRSA infection nor to guide or monitor treatment for MRSA infections. Test performance is not FDA approved in patients less than 80 years old. Performed at [facility], [place] [address] [place], [place] [address]      Time coordinating discharge: Over 30 minutes  SIGNED:   Little Ishikawa, DO [facility] 02/23/2021, 6:27 PM Pager   If 7PM-7AM, please contact night-coverage [email or web address]

## 2021-02-23 NOTE — Progress Notes (Signed)
  Echocardiogram 2D Echocardiogram has been performed.  Melinda Gomez F 02/23/2021, 5:03 PM

## 2021-02-23 NOTE — Consult Note (Addendum)
Cardiology Consultation:   Patient ID: Melinda Gomez MRN: [id number]; DOB: [birth year]  Admit date: 02/22/2021 Date of Consult: 02/23/2021  PCP:  Lavone Orn, MD   [facility] Providers Cardiologist: New    Patient Profile:   Melinda Gomez is a 75 y.o. female with a hx of hypertension, hyperlipidemia, COPD with ongoing tobacco smoking, GERD and anxiety who is being seen 02/23/2021 for the evaluation of chest pain and shortness of breath at the request of Dr Velia Meyer.  History of Present Illness:   Melinda Gomez has 3 to 72-months history of intermittent chest pain, shortness of breath and palpitation.  This has progressively gotten worse.  Nothing makes better or worse.  Patient reports sudden onset palpitation followed by chest tightness and shortness of breath.  Each episode lasts for few minutes with self resolution.  She has sedentary lifestyle.  Ongoing tobacco smoking.  No exercise.  She is walking about 25 steps at a time.  Denies orthopnea, PND, syncope, lower extremity edema or dizziness.  She went to see her PCP on October 30 for ongoing symptoms.  Noted elevated troponin and sent to ER.  However patient left from triage before being seen due to long wait time.  However return to [facility] due to worsening symptoms.  She was admitted for further evaluation.  Hs-troponin 20>>18>>20>>22>>24 BNP 226 K 3.4>>3.6 MG 1.6 HGb 12.5 Chest x-ray without acute cardiopulmonary disease   Past Medical History:  Diagnosis Date   COPD (chronic obstructive pulmonary disease) (HCC)    GAD (generalized anxiety disorder)    GERD (gastroesophageal reflux disease)    Hypercholesteremia    Hypertension     History reviewed. No pertinent surgical history.   Inpatient Medications: Scheduled Meds:  amLODipine  5 mg Oral Daily   aspirin  325 mg Oral Daily   fluticasone furoate-vilanterol  1 puff Inhalation Daily   losartan  100 mg Oral Daily   pantoprazole (PROTONIX) IV  40 mg  Intravenous Q24H   risperiDONE  1 mg Oral QHS   rosuvastatin  20 mg Oral Daily   umeclidinium bromide  1 puff Inhalation Daily   venlafaxine XR  225 mg Oral Daily   Continuous Infusions:  magnesium sulfate bolus IVPB     PRN Meds: acetaminophen **OR** acetaminophen, albuterol, ALPRAZolam, melatonin, nicotine, nitroGLYCERIN  Allergies:    Allergies  Allergen Reactions   Talwin [Pentazocine] Other (See Comments)    hallucinations    Social History:   Social History   Socioeconomic History   Marital status: Married    Spouse name: Not on file   Number of children: Not on file   Years of education: Not on file   Highest education level: Not on file  Occupational History   Not on file  Tobacco Use   Smoking status: Every Day    Packs/day: 0.50    Types: Cigarettes   Smokeless tobacco: Never  Substance and Sexual Activity   Alcohol use: Never   Drug use: Never   Sexual activity: Not on file  Other Topics Concern   Not on file  Social History Narrative   Not on file   Social Determinants of Health   Financial Resource Strain: Not on file  Food Insecurity: Not on file  Transportation Needs: Not on file  Physical Activity: Not on file  Stress: Not on file  Social Connections: Not on file  Intimate Partner Violence: Not on file    Family History:   History reviewed. No pertinent  family history.  Mother had multiple strokes. No family history of CAD  ROS:  Please see the history of present illness.  All other ROS reviewed and negative.     Physical Exam/Data:   Vitals:   02/22/21 2301 02/23/21 0300 02/23/21 0600 02/23/21 0757  BP: 102/69 (!) 128/58 (!) 116/53 128/69  Pulse: (!) 112 (!) 104 (!) 105 (!) 108  Resp: 20 20 (!) 22 20  Temp: 98.3 F (36.8 C) 98.5 F (36.9 C)  98.3 F (36.8 C)  TempSrc: Oral Oral  Oral  SpO2: 96% 93% 95% 95%  Weight:  62.9 kg    Height:        Intake/Output Summary (Last 24 hours) at 02/23/2021 1041 Last data filed at  02/23/2021 0848 Gross per 24 hour  Intake --  Output 1000 ml  Net -1000 ml   Last 3 Weights 02/23/2021 02/22/2021 02/22/2021  Weight (lbs) 138 lb 10.7 oz 138 lb 10.7 oz 145 lb  Weight (kg) 62.9 kg 62.9 kg 65.772 kg     Body mass index is 25.36 kg/m.  General: Elderly appearing in no acute distress HEENT: normal Neck: no JVD Vascular: No carotid bruits; Distal pulses 2+ bilaterally Cardiac:  normal S1, S2; regular tachycardic no murmur  Lungs: Breath sounds with intermittent wheezing Abd: soft, nontender, no hepatomegaly  Ext: no edema Musculoskeletal:  No deformities, BUE and BLE strength normal and equal Skin: warm and dry  Neuro:  CNs 2-12 intact, no focal abnormalities noted Psych:  Normal affect   EKG:  The EKG was personally reviewed and demonstrates: Sinus tachycardia, left bundle branch block Telemetry:  Telemetry was personally reviewed and demonstrates: Sinus tachycardia  Relevant CV Studies: AS above  Laboratory Data:  High Sensitivity Troponin:   Recent Labs  Lab 02/21/21 2137 02/22/21 1514 02/22/21 1716 02/23/21 0051 02/23/21 0452  TROPONINIHS 20* 18* 20* 22* 24*     Chemistry Recent Labs  Lab 02/21/21 1956 02/22/21 1514 02/23/21 0051  NA 138 138 138  K 3.6 3.4* 3.6  CL 102 101 105  CO2 29 27 26   GLUCOSE 119* 113* 109*  BUN 5* 8 7*  CREATININE 0.85 0.80 0.83  CALCIUM 9.1 9.8 8.2*  MG  --   --  1.6*  GFRNONAA >60 >60 >60  ANIONGAP 7 10 7     Recent Labs  Lab 02/22/21 1514 02/23/21 0051  PROT 7.0 5.4*  ALBUMIN 4.6 3.1*  AST 18 18  ALT 15 16  ALKPHOS 56 50  BILITOT 0.5 0.2*    Hematology Recent Labs  Lab 02/21/21 1956 02/22/21 1514 02/23/21 0051  WBC 10.7* 7.8 7.8  RBC 4.46 4.72 4.15  HGB 13.5 14.1 12.5  HCT 41.0 41.9 36.7  MCV 91.9 88.8 88.4  MCH 30.3 29.9 30.1  MCHC 32.9 33.7 34.1  RDW 13.0 12.9 12.9  PLT 217 210 197   BNP Recent Labs  Lab 02/21/21 1956 02/22/21 1514  BNP 139.6* 226.6*    DDimer  Recent Labs  Lab  02/22/21 1514  DDIMER 0.54*     Radiology/Studies:  DG Chest 1 View  Result Date: 02/21/2021 CLINICAL DATA:  Chest pain. EXAM: CHEST  1 VIEW COMPARISON:  November 01, 2019 FINDINGS: Low lung volumes are seen with mild, stable atelectasis noted within the lateral aspect of the left lung base. There is no evidence of acute infiltrate, pleural effusion or pneumothorax. The heart size and mediastinal contours are within normal limits. Moderate severity calcification of the aortic arch is  noted. The visualized skeletal structures are unremarkable. IMPRESSION: Low lung volumes with mild, stable left basilar atelectasis. Electronically Signed   By: Virgina Norfolk M.D.   On: 02/21/2021 20:25   DG Chest Portable 1 View  Result Date: 02/22/2021 CLINICAL DATA:  75 year old female with history of shortness of breath. EXAM: PORTABLE CHEST 1 VIEW COMPARISON:  Chest x-ray 02/21/2021. FINDINGS: Mild chronic scarring in the left lung base. Lung volumes are normal. No acute consolidative airspace disease. No pleural effusions. No suspicious appearing pulmonary nodules or masses are noted. No evidence of pulmonary edema. Heart size is normal. Upper mediastinal contours are within normal limits. Atherosclerotic calcifications are noted in the thoracic aorta. IMPRESSION: 1. No radiographic evidence of acute cardiopulmonary disease. 2. Aortic atherosclerosis. Electronically Signed   By: Vinnie Langton M.D.   On: 02/22/2021 15:34     Assessment and Plan:   Chest pain/shortness of breath/palpitation Mildly elevated troponin Left bundle blanch block  Patient with few months history of intermittent palpitation followed by chest pain and shortness of breath.  This has progressively get more intense and lasting longer.  Troponin minimally elevated, not in ACS pattern.  EKG with sinus tachycardia and left bundle blanch block.  No prior EKG to compare.  At baseline, patient has sedentary lifestyle.  She barely walks 25  steps at a time.  She is a longtime smoker, currently smokes half pack cigarette per day  Her palpitation could be normal because of sedentary lifestyle.  Certainly she could have underlying CAD given risk factors and left bundle blanch block.  We will get echocardiogram.  Will review invasive versus noninvasive evaluation with MD. I do not think coronary CTA would be a good choice given baseline tachycardia.  Continue Crestor 20 mg daily.  Change aspirin to 81 mg daily.  4. HTN -Blood pressure relatively stable on amlodipine and losartan.  5. Tobacco smoking -At length discussion about smoking cessation.  Education given.  Daughter and husband were present during conversation  6. COPD -Intermittent wheezing -Primary team  7.  Leg pain -Per primary team  Dr. Irish Lack to see. Keep NPO.   Risk Assessment/Risk Scores:  { HEAR Score (for undifferentiated chest pain):  HEAR Score: 5{   For questions or updates, please contact [place] Please consult [email or web address] for contact info under    SignedLeanor Kail, PA  02/23/2021 10:41 AM   I have examined the patient and reviewed assessment and plan and discussed with patient.  Agree with above as stated.    She needs aggressive risk factor modification.  Smoking cessation was discussed.  We will plan for Lexiscan Myoview to evaluate for ischemia.  Low-level elevated troponin, not in the pattern of ACS.  CTA would not work well given her high heart rate.  We also discussed her left bundle branch block.  Would only consider cath if she has high risk Myoview.  Otherwise, medical therapy.  She needs to change her sedentary lifestyle as well.  Larae Grooms

## 2021-02-23 NOTE — Progress Notes (Addendum)
   Audie Clear presented for a nuclear stress test today.  No immediate complications.  Stress imaging is pending at this time.  Preliminary EKG findings may be listed in the chart, but the stress test result will not be finalized until perfusion imaging is complete.  Per nuc med team, this is assigned to [facility] to read.  Addendum @ 4:45pm - called radiology to ensure they are aware [facility] to read. They confirmed this is assigned to radiologist and will let them know cardiology requesting to be read. I called into the patient's room as well to let her know we were still awaiting result. Will plan to s/o to on call APP to review result - echo result also pending at this time.  Charlie Pitter, PA-C 02/23/2021, 1:39 PM

## 2021-03-02 DIAGNOSIS — R609 Edema, unspecified: Secondary | ICD-10-CM | POA: Diagnosis not present

## 2021-03-02 DIAGNOSIS — I5022 Chronic systolic (congestive) heart failure: Secondary | ICD-10-CM | POA: Diagnosis not present

## 2021-03-02 DIAGNOSIS — H1013 Acute atopic conjunctivitis, bilateral: Secondary | ICD-10-CM | POA: Diagnosis not present

## 2021-03-05 DIAGNOSIS — E782 Mixed hyperlipidemia: Secondary | ICD-10-CM | POA: Diagnosis not present

## 2021-03-05 DIAGNOSIS — G4700 Insomnia, unspecified: Secondary | ICD-10-CM | POA: Diagnosis not present

## 2021-03-05 DIAGNOSIS — K219 Gastro-esophageal reflux disease without esophagitis: Secondary | ICD-10-CM | POA: Diagnosis not present

## 2021-03-05 DIAGNOSIS — J449 Chronic obstructive pulmonary disease, unspecified: Secondary | ICD-10-CM | POA: Diagnosis not present

## 2021-03-05 DIAGNOSIS — I10 Essential (primary) hypertension: Secondary | ICD-10-CM | POA: Diagnosis not present

## 2021-03-05 DIAGNOSIS — F325 Major depressive disorder, single episode, in full remission: Secondary | ICD-10-CM | POA: Diagnosis not present

## 2021-03-30 NOTE — Progress Notes (Deleted)
Cardiology Office Note:   Date:  03/30/2021  NAME:  Melinda Gomez    MRN: [id number] DOB:  Mar 08, 1946   PCP:  Lavone Orn, MD  Cardiologist:  None  Electrophysiologist:  None   Referring MD: Johna Roles, PA   No chief complaint on file. ***  History of Present Illness:   Melinda Gomez is a 76 y.o. female with a hx of COPD, tobacco abuse, hypertension, bundle-branch block who is being seen today for the evaluation of systolic heart failure at the request of Johna Roles, [place].  Recently seen in the hospital for chest pain on 02/23/2021.  Troponins were negative.  EKG demonstrated left bundle branch block.  Stress test was performed that was negative.  Apparently an echocardiogram was performed after she was discharged and showed LVEF 25-30% with significant LV dyssynchrony.  She presents for follow-up today.  Problem List COPD Tobacco abuse  HTN Systolic HF -EF 44-03% 47/4259 -significant dyssynchrony  5. LBBB QRS 163 ms 6. Coronary calcificatons on chest ct (01/01/2021)  Past Medical History: Past Medical History:  Diagnosis Date   COPD (chronic obstructive pulmonary disease) (HCC)    GAD (generalized anxiety disorder)    GERD (gastroesophageal reflux disease)    Hypercholesteremia    Hypertension     Past Surgical History: No past surgical history on file.  Current Medications: No outpatient medications have been marked as taking for the 04/02/21 encounter (Appointment) with O'Neal, Cassie Freer, MD.     Allergies:    Talwin [pentazocine]   Social History: Social History   Socioeconomic History   Marital status: Married    Spouse name: Not on file   Number of children: Not on file   Years of education: Not on file   Highest education level: Not on file  Occupational History   Not on file  Tobacco Use   Smoking status: Every Day    Packs/day: 0.50    Types: Cigarettes   Smokeless tobacco: Never  Substance and Sexual Activity   Alcohol use:  Never   Drug use: Never   Sexual activity: Not on file  Other Topics Concern   Not on file  Social History Narrative   Not on file   Social Determinants of Health   Financial Resource Strain: Not on file  Food Insecurity: Not on file  Transportation Needs: Not on file  Physical Activity: Not on file  Stress: Not on file  Social Connections: Not on file     Family History: The patient's ***family history is not on file.  ROS:   All other ROS reviewed and negative. Pertinent positives noted in the HPI.     EKGs/Labs/Other Studies Reviewed:   The following studies were personally reviewed by me today:  EKG:  EKG is *** ordered today.  The ekg ordered today demonstrates ***, and was personally reviewed by me.   TTE 02/23/2021   1. Left ventricular ejection fraction, by estimation, is 25 to 30%. The  left ventricle has severely decreased function. The left ventricle  demonstrates regional wall motion abnormalities. Global hypokinesis with  septal dyskinesis. Left ventricular  diastolic parameters are consistent with Grade II diastolic dysfunction  (pseudonormalization). Elevated left atrial pressure.   2. Right ventricular systolic function is normal. The right ventricular  size is normal. Moderately increased right ventricular wall thickness.  Tricuspid regurgitation signal is inadequate for assessing PA pressure.   3. The mitral valve is normal in structure. Mild mitral valve  regurgitation.   4. The aortic valve was not well visualized. Aortic valve regurgitation  is not visualized. No aortic stenosis is present.   5. The inferior vena cava is normal in size with greater than 50%  respiratory variability, suggesting right atrial pressure of 3 mmHg.   NM Stress 02/23/2021 IMPRESSION: 1. No reversible ischemia.  Possible infarct involving the apex.   2. Abnormal left ventricular wall motion with diffuse hypokinesia. Difficult to exclude areas of paradoxical motion.    3. Left ventricular ejection fraction is 29%.   4. Non invasive risk stratification*: Low  Recent Labs: 02/22/2021: B Natriuretic Peptide 226.6 02/23/2021: ALT 16; BUN 7; Creatinine, Ser 0.83; Hemoglobin 12.5; Magnesium 1.6; Platelets 197; Potassium 3.6; Sodium 138   Recent Lipid Panel No results found for: CHOL, TRIG, HDL, CHOLHDL, VLDL, LDLCALC, LDLDIRECT  Physical Exam:   VS:  There were no vitals taken for this visit.   Wt Readings from Last 3 Encounters:  02/23/21 138 lb 10.7 oz (62.9 kg)    General: Well nourished, well developed, in no acute distress Head: Atraumatic, normal size  Eyes: PEERLA, EOMI  Neck: Supple, no JVD Endocrine: No thryomegaly Cardiac: Normal S1, S2; RRR; no murmurs, rubs, or gallops Lungs: Clear to auscultation bilaterally, no wheezing, rhonchi or rales  Abd: Soft, nontender, no hepatomegaly  Ext: No edema, pulses 2+ Musculoskeletal: No deformities, BUE and BLE strength normal and equal Skin: Warm and dry, no rashes   Neuro: Alert and oriented to person, place, time, and situation, CNII-XII grossly intact, no focal deficits  Psych: Normal mood and affect   ASSESSMENT:   Melinda Gomez is a 76 y.o. female who presents for the following: No diagnosis found.  PLAN:   There are no diagnoses linked to this encounter.  {Are you ordering a CV Procedure (e.g. stress test, cath, DCCV, TEE, etc)?   Press F2        :[id number]}  Disposition: No follow-ups on file.  Medication Adjustments/Labs and Tests Ordered: Current medicines are reviewed at length with the patient today.  Concerns regarding medicines are outlined above.  No orders of the defined types were placed in this encounter.  No orders of the defined types were placed in this encounter.   There are no Patient Instructions on file for this visit.   Time Spent with Patient: I have spent a total of *** minutes with patient reviewing hospital notes, telemetry, EKGs, labs and examining the  patient as well as establishing an assessment and plan that was discussed with the patient.  > 50% of time was spent in direct patient care.  Signed, Addison Naegeli. Audie Box, MD, [place]  [address], [place], [place] [address] [phone]  03/30/2021 5:48 PM

## 2021-04-02 ENCOUNTER — Ambulatory Visit: Payer: PPO | Admitting: Cardiovascular Disease

## 2021-04-02 DIAGNOSIS — I2510 Atherosclerotic heart disease of native coronary artery without angina pectoris: Secondary | ICD-10-CM

## 2021-04-02 DIAGNOSIS — I5022 Chronic systolic (congestive) heart failure: Secondary | ICD-10-CM

## 2021-04-20 DIAGNOSIS — E785 Hyperlipidemia, unspecified: Secondary | ICD-10-CM | POA: Diagnosis not present

## 2021-04-20 DIAGNOSIS — J449 Chronic obstructive pulmonary disease, unspecified: Secondary | ICD-10-CM | POA: Diagnosis not present

## 2021-04-20 DIAGNOSIS — I10 Essential (primary) hypertension: Secondary | ICD-10-CM | POA: Diagnosis not present

## 2021-04-20 DIAGNOSIS — Z6827 Body mass index (BMI) 27.0-27.9, adult: Secondary | ICD-10-CM | POA: Diagnosis not present

## 2021-04-20 DIAGNOSIS — D692 Other nonthrombocytopenic purpura: Secondary | ICD-10-CM | POA: Diagnosis not present

## 2021-04-20 DIAGNOSIS — Z7951 Long term (current) use of inhaled steroids: Secondary | ICD-10-CM | POA: Diagnosis not present

## 2021-04-23 NOTE — H&P (View-Only) (Signed)
Cardiology Office Note:   Date:  04/24/2021  NAME:  Melinda Gomez    MRN: [id number] DOB:  Oct 01, 1945   PCP:  Lavone Orn, MD  Cardiologist:  None  Electrophysiologist:  None   Referring MD: Johna Roles, PA   Chief Complaint  Patient presents with   Congestive Heart Failure    History of Present Illness:   Melinda Gomez is a 76 y.o. female with a hx of COPD/tobacco abuse, HTN, HLD who is being seen today for the evaluation of systolic HF at the request of Lavone Orn, MD. she was sent to the emergency room on 02/23/2021 for chest pressure.  EKG showed left bundle branch block and she was sent to the ER.  She underwent evaluation there which showed a stress test with prior infarct.  Her echocardiogram demonstrated LVEF 25-30%.  She apparently was discharged without definitive plan.  She reports no further chest pain but has had heart failure symptoms.  She reports lower extremity edema as well as shortness of breath.  She is not that active but does have limitations with walking around her house.  Her EKG demonstrates sinus rhythm with left bundle branch block, QRS 168 ms.  She does have evidence of coronary calcifications on recent chest CT.  Her most recent cholesterol profile is acceptable.  She has never had a heart attack.  History of stroke.  She reports her mother had stroke.  No strong family history of heart disease.  She is a former smoker.  She smoked for nearly 50 years.  She quit 1 month ago.  COPD is listed in her chart.  She reports she does not have a diagnosis of this.  She does use inhalers.  She does have evidence of increased volume today.  We discussed pursuing a left and right heart catheterization as well as medical management.  She reports she is okay to do this.  She presents with her husband.  She is married.  She has 2 children.  She has 5 grandchildren.  Problem List COPD/Tobacco abuse HTN HLD LBBB Systolic HF -82/9562: EF 13-08% -MPI infarct  02/23/2021 6. HLD -T chol 125, HDL 52, LDL 43, TG 186  Past Medical History: Past Medical History:  Diagnosis Date   COPD (chronic obstructive pulmonary disease) (HCC)    GAD (generalized anxiety disorder)    GERD (gastroesophageal reflux disease)    Hypercholesteremia    Hypertension     Past Surgical History: Past Surgical History:  Procedure Laterality Date   ABDOMINAL HYSTERECTOMY     BLADDER SURGERY      Current Medications: Current Meds  Medication Sig   albuterol (VENTOLIN HFA) 108 (90 Base) MCG/ACT inhaler    B Complex Vitamins (VITAMIN B COMPLEX) TABS as needed.   Biotin 5000 MCG TABS Take 5,000 mcg by mouth daily.   carvedilol (COREG) 3.125 MG tablet Take 1 tablet (3.125 mg total) by mouth 2 (two) times daily.   Cholecalciferol (VITAMIN D3) 125 MCG (5000 UT) TABS Take 5,000 Units by mouth daily.   cholestyramine (QUESTRAN) 4 GM/DOSE powder Take by mouth See admin instructions. 13 grams added to 2-6 oz of water as needed   cyclobenzaprine (FLEXERIL) 10 MG tablet Take 10 mg by mouth daily in the afternoon.   dapagliflozin propanediol (FARXIGA) 10 MG TABS tablet Take 1 tablet (10 mg total) by mouth daily before breakfast.   diphenhydrAMINE (BENADRYL) 25 mg capsule Take 25 mg by mouth every 6 (six) hours as needed.  Fluticasone-Umeclidin-Vilant (TRELEGY ELLIPTA) 100-62.5-25 MCG/ACT AEPB Inhale 1 puff into the lungs daily.   furosemide (LASIX) 40 MG tablet Take 40 mg by mouth daily.   Garlic (GARLIQUE PO) Take 1 tablet by mouth daily.   melatonin 5 MG TABS Take 5 mg by mouth at bedtime.   risperiDONE (RISPERDAL) 1 MG tablet TAKE 1 TABLET BY MOUTH AT BEDTIME. FOR MOOD   rosuvastatin (CRESTOR) 20 MG tablet Take 20 mg by mouth daily.   traMADol (ULTRAM) 50 MG tablet Take 50 mg by mouth as needed.   traZODone (DESYREL) 100 MG tablet 1 to 2 tablets at night as needed for sleep   venlafaxine XR (EFFEXOR-XR) 75 MG 24 hr capsule Take 3 capsules (225 mg total) by mouth daily.    [DISCONTINUED] amLODipine (NORVASC) 5 MG tablet Take 5 mg by mouth daily.   [DISCONTINUED] losartan (COZAAR) 100 MG tablet Take 100 mg by mouth daily.     Allergies:    Talwin [pentazocine]   Social History: Social History   Socioeconomic History   Marital status: Married    Spouse name: Not on file   Number of children: 2   Years of education: Not on file   Highest education level: Not on file  Occupational History   Occupation: Retired - Chemical engineer  Tobacco Use   Smoking status: Former    Packs/day: 0.50    Years: 55.00    Pack years: 27.50    Types: Cigarettes    Quit date: 02/2021    Years since quitting: 0.1   Smokeless tobacco: Never  Substance and Sexual Activity   Alcohol use: Never   Drug use: Never   Sexual activity: Not on file  Other Topics Concern   Not on file  Social History Narrative   Not on file   Social Determinants of Health   Financial Resource Strain: Not on file  Food Insecurity: Not on file  Transportation Needs: Not on file  Physical Activity: Not on file  Stress: Not on file  Social Connections: Not on file     Family History: The patient's family history includes Stroke in her mother.  ROS:   All other ROS reviewed and negative. Pertinent positives noted in the HPI.     EKGs/Labs/Other Studies Reviewed:   The following studies were personally reviewed by me today:  EKG:  EKG is ordered today.  The ekg ordered today demonstrates normal sinus rhythm heart rate 89, left bundle branch block, QRS 168 ms, and was personally reviewed by me.   NM Stress 02/23/2021 IMPRESSION: 1. No reversible ischemia.  Possible infarct involving the apex.   2. Abnormal left ventricular wall motion with diffuse hypokinesia. Difficult to exclude areas of paradoxical motion.   3. Left ventricular ejection fraction is 29%.   4. Non invasive risk stratification*: Low   *2012 Appropriate Use Criteria for Coronary Revascularization Focused  Update: J Am Coll Cardiol. 9381;01(7):510-258. http://content.airportbarriers.com.aspx?articleid=1201161  TTE 02/23/2021  1. Left ventricular ejection fraction, by estimation, is 25 to 30%. The  left ventricle has severely decreased function. The left ventricle  demonstrates regional wall motion abnormalities. Global hypokinesis with  septal dyskinesis. Left ventricular  diastolic parameters are consistent with Grade II diastolic dysfunction  (pseudonormalization). Elevated left atrial pressure.   2. Right ventricular systolic function is normal. The right ventricular  size is normal. Moderately increased right ventricular wall thickness.  Tricuspid regurgitation signal is inadequate for assessing PA pressure.   3. The mitral valve is  normal in structure. Mild mitral valve  regurgitation.   4. The aortic valve was not well visualized. Aortic valve regurgitation  is not visualized. No aortic stenosis is present.   5. The inferior vena cava is normal in size with greater than 50%  respiratory variability, suggesting right atrial pressure of 3 mmHg.   Recent Labs: 02/22/2021: B Natriuretic Peptide 226.6 02/23/2021: ALT 16; BUN 7; Creatinine, Ser 0.83; Hemoglobin 12.5; Magnesium 1.6; Platelets 197; Potassium 3.6; Sodium 138   Recent Lipid Panel No results found for: CHOL, TRIG, HDL, CHOLHDL, VLDL, LDLCALC, LDLDIRECT  Physical Exam:   VS:  BP 126/60 (BP Location: Left Arm, Patient Position: Sitting, Cuff Size: Large)    Pulse 89    Ht 5\' 2"  (1.575 m)    Wt 148 lb 9.6 oz (67.4 kg)    SpO2 95%    BMI 27.18 kg/m    Wt Readings from Last 3 Encounters:  04/24/21 148 lb 9.6 oz (67.4 kg)  02/23/21 138 lb 10.7 oz (62.9 kg)    General: Well nourished, well developed, in no acute distress Head: Atraumatic, normal size  Eyes: PEERLA, EOMI  Neck: Supple, no JVD Endocrine: No thryomegaly Cardiac: Normal S1, S2; RRR; no murmurs, rubs, or gallops Lungs: Clear to auscultation bilaterally, no  wheezing, rhonchi or rales  Abd: Soft, nontender, no hepatomegaly  Ext: 1+ pitting edema Musculoskeletal: No deformities, BUE and BLE strength normal and equal Skin: Warm and dry, no rashes   Neuro: Alert and oriented to person, place, time, and situation, CNII-XII grossly intact, no focal deficits  Psych: Normal mood and affect   ASSESSMENT:   DEANN MCLAINE is a 76 y.o. female who presents for the following: 1. Chronic systolic heart failure ([place])   2. LBBB (left bundle branch block)   3. Primary hypertension   4. Mixed hyperlipidemia     PLAN:   1. Chronic systolic heart failure ([place]) 2. LBBB (left bundle branch block) -Recent diagnosis of congestive heart failure in December 2022.  EKG demonstrates a left bundle branch block with wide QRS 168 ms.  Stress test performed in the hospital shows evidence of prior infarct.  She does have evidence of coronary calcifications.  Apparently none of this was followed up with her.  Her ejection fraction is 25-30%.  She is now on appropriate medical therapy. -Stop amlodipine and losartan.  Start Coreg 3.125 mg twice daily, Entresto 24-26 mg twice daily, Farxiga 10 mg daily.  She will continue with Lasix 40 mg daily.  Suspect she will have improvement in her edema with GDMT. -Regarding the etiology she needs left heart catheterization.  I am concerned given that her stress test shows infarction as well as heavy coronary calcifications on chest CT.  We will exclude this with a left and right heart catheterization next week.  Risk and benefits explained.  She is willing to proceed.  See consent statement below. -She also has a left bundle branch block with a very wide QRS.  She has evidence of LV dyssynchrony.  This could be contributing.  Regardless we will exclude CAD.  We will titrate medical therapy.  I will see her back in 2 weeks.  She will undergo left heart catheterization next week.  Shared Decision Making/Informed Consent The risks [stroke (1  in 1000), death (1 in 1000), kidney failure [usually temporary] (1 in 500), bleeding (1 in 200), allergic reaction [possibly serious] (1 in 200)], benefits (diagnostic support and management of coronary artery disease) and  alternatives of a cardiac catheterization were discussed in detail with Ms. Muraoka and she is willing to proceed.  3. Primary hypertension -Transition to Coreg, [place], [place] above.  We will add Aldactone when we see her back.  4. Mixed hyperlipidemia -Continue Crestor.  Well-controlled.   Shared Decision Making/Informed Consent The risks [stroke (1 in 1000), death (1 in 1000), kidney failure [usually temporary] (1 in 500), bleeding (1 in 200), allergic reaction [possibly serious] (1 in 200)], benefits (diagnostic support and management of coronary artery disease) and alternatives of a cardiac catheterization were discussed in detail with Ms. Fayad and she is willing to proceed.  Disposition: Return in about 2 weeks (around 05/08/2021).  Medication Adjustments/Labs and Tests Ordered: Current medicines are reviewed at length with the patient today.  Concerns regarding medicines are outlined above.  Orders Placed This Encounter  Procedures   HIV Antibody (routine testing w rflx)   Basic metabolic panel   Brain natriuretic peptide   CBC   Hemoglobin A1c   EKG 12-Lead   Meds ordered this encounter  Medications   carvedilol (COREG) 3.125 MG tablet    Sig: Take 1 tablet (3.125 mg total) by mouth 2 (two) times daily.    Dispense:  180 tablet    Refill:  3   dapagliflozin propanediol (FARXIGA) 10 MG TABS tablet    Sig: Take 1 tablet (10 mg total) by mouth daily before breakfast.    Dispense:  90 tablet    Refill:  1    Patient Instructions  Medication Instructions:  STOP Losartan  STOP Amlodipine  START Coreg 3.125 mg twice daily  START Fargixa 10 mg daily  START Entresto 24-26 (use samples provided until you come back to see [place])  START baby aspirin 81 mg  daily   *If you need a refill on your cardiac medications before your next appointment, please call your pharmacy*   Lab Work: BMET, CBC, BNP, HIV today   If you have labs (blood work) drawn today and your tests are completely normal, you will receive your results only by: MyChart Message (if you have MyChart) OR A paper copy in the mail If you have any lab test that is abnormal or we need to change your treatment, we will call you to review the results.   Testing/Procedures: Your physician has requested that you have a cardiac catheterization. Cardiac catheterization is used to diagnose and/or treat various heart conditions. Doctors may recommend this procedure for a number of different reasons. The most common reason is to evaluate chest pain. Chest pain can be a symptom of coronary artery disease (CAD), and cardiac catheterization can show whether plaque is narrowing or blocking your hearts arteries. This procedure is also used to evaluate the valves, as well as measure the blood flow and oxygen levels in different parts of your heart. For further information please visit [email or web address]. Please follow instruction sheet, as given.    Follow-Up: At [facility], you and your health needs are our priority.  As part of our continuing mission to provide you with exceptional heart care, we have created designated Provider Care Teams.  These Care Teams include your primary Cardiologist (physician) and Advanced Practice Providers (APPs -  Physician Assistants and Nurse Practitioners) who all work together to provide you with the care you need, when you need it.  We recommend signing up for the patient portal called "MyChart".  Sign up information is provided on this After Visit Summary.  MyChart is used to  connect with patients for Virtual Visits (Telemedicine).  Patients are able to view lab/test results, encounter notes, upcoming appointments, etc.  Non-urgent messages can be sent to your  provider as well.   To learn more about what you can do with MyChart, go to [email or web address].    Your next appointment:   February 16th at 2:00 PM   The format for your next appointment:   In Person  Provider:   Eleonore Chiquito, MD    Other Instructions  [place] [address] Dept: [phone] Loc: [place]  04/24/2021  You are scheduled for a Cardiac Catheterization on Tuesday, February 7 with Dr. Peter Martinique.  1. Please arrive at the [facility] (Main Entrance A) at [facility]: [address] [place], [place] [address] at 5:30 AM (This time is two hours before your procedure to ensure your preparation). Free valet parking service is available.   Special note: Every effort is made to have your procedure done on time. Please understand that emergencies sometimes delay scheduled procedures.  2. Diet: Do not eat solid foods after midnight.  The patient may have clear liquids until 5am upon the day of the procedure.  3. Labs: You will need to have blood drawn today- You do not need to be fasting.  4. Medication instructions in preparation for your procedure:   Contrast Allergy: No  Hold Entresto the day of, and Lasix the day of  On the morning of your procedure, take your Aspirin and any morning medicines NOT listed above.  You may use sips of water.  5. Plan for one night stay--bring personal belongings. 6. Bring a current list of your medications and current insurance cards. 7. You MUST have a responsible person to drive you home. 8. Someone MUST be with you the first 24 hours after you arrive home or your discharge will be delayed. 9. Please wear clothes that are easy to get on and off and wear slip-on shoes.  Thank you for allowing [place] to care for you!   -- [facility]      Signed, Addison Naegeli. Audie Box, MD, [facility]  [address] [place], [place] [address] [phone]  04/24/2021 2:41 PM

## 2021-04-23 NOTE — Progress Notes (Signed)
Cardiology Office Note:   Date:  04/24/2021  NAME:  Melinda Gomez    MRN: [id number] DOB:  06/12/1945   PCP:  Lavone Orn, MD  Cardiologist:  None  Electrophysiologist:  None   Referring MD: Johna Roles, PA   Chief Complaint  Patient presents with   Congestive Heart Failure    History of Present Illness:   Melinda Gomez is a 76 y.o. female with a hx of COPD/tobacco abuse, HTN, HLD who is being seen today for the evaluation of systolic HF at the request of Lavone Orn, MD. she was sent to the emergency room on 02/23/2021 for chest pressure.  EKG showed left bundle branch block and she was sent to the ER.  She underwent evaluation there which showed a stress test with prior infarct.  Her echocardiogram demonstrated LVEF 25-30%.  She apparently was discharged without definitive plan.  She reports no further chest pain but has had heart failure symptoms.  She reports lower extremity edema as well as shortness of breath.  She is not that active but does have limitations with walking around her house.  Her EKG demonstrates sinus rhythm with left bundle branch block, QRS 168 ms.  She does have evidence of coronary calcifications on recent chest CT.  Her most recent cholesterol profile is acceptable.  She has never had a heart attack.  History of stroke.  She reports her mother had stroke.  No strong family history of heart disease.  She is a former smoker.  She smoked for nearly 50 years.  She quit 1 month ago.  COPD is listed in her chart.  She reports she does not have a diagnosis of this.  She does use inhalers.  She does have evidence of increased volume today.  We discussed pursuing a left and right heart catheterization as well as medical management.  She reports she is okay to do this.  She presents with her husband.  She is married.  She has 2 children.  She has 5 grandchildren.  Problem List COPD/Tobacco abuse HTN HLD LBBB Systolic HF -27/5170: EF 01-74% -MPI infarct  02/23/2021 6. HLD -T chol 125, HDL 52, LDL 43, TG 186  Past Medical History: Past Medical History:  Diagnosis Date   COPD (chronic obstructive pulmonary disease) (HCC)    GAD (generalized anxiety disorder)    GERD (gastroesophageal reflux disease)    Hypercholesteremia    Hypertension     Past Surgical History: Past Surgical History:  Procedure Laterality Date   ABDOMINAL HYSTERECTOMY     BLADDER SURGERY      Current Medications: Current Meds  Medication Sig   albuterol (VENTOLIN HFA) 108 (90 Base) MCG/ACT inhaler    B Complex Vitamins (VITAMIN B COMPLEX) TABS as needed.   Biotin 5000 MCG TABS Take 5,000 mcg by mouth daily.   carvedilol (COREG) 3.125 MG tablet Take 1 tablet (3.125 mg total) by mouth 2 (two) times daily.   Cholecalciferol (VITAMIN D3) 125 MCG (5000 UT) TABS Take 5,000 Units by mouth daily.   cholestyramine (QUESTRAN) 4 GM/DOSE powder Take by mouth See admin instructions. 13 grams added to 2-6 oz of water as needed   cyclobenzaprine (FLEXERIL) 10 MG tablet Take 10 mg by mouth daily in the afternoon.   dapagliflozin propanediol (FARXIGA) 10 MG TABS tablet Take 1 tablet (10 mg total) by mouth daily before breakfast.   diphenhydrAMINE (BENADRYL) 25 mg capsule Take 25 mg by mouth every 6 (six) hours as needed.  Fluticasone-Umeclidin-Vilant (TRELEGY ELLIPTA) 100-62.5-25 MCG/ACT AEPB Inhale 1 puff into the lungs daily.   furosemide (LASIX) 40 MG tablet Take 40 mg by mouth daily.   Garlic (GARLIQUE PO) Take 1 tablet by mouth daily.   melatonin 5 MG TABS Take 5 mg by mouth at bedtime.   risperiDONE (RISPERDAL) 1 MG tablet TAKE 1 TABLET BY MOUTH AT BEDTIME. FOR MOOD   rosuvastatin (CRESTOR) 20 MG tablet Take 20 mg by mouth daily.   traMADol (ULTRAM) 50 MG tablet Take 50 mg by mouth as needed.   traZODone (DESYREL) 100 MG tablet 1 to 2 tablets at night as needed for sleep   venlafaxine XR (EFFEXOR-XR) 75 MG 24 hr capsule Take 3 capsules (225 mg total) by mouth daily.    [DISCONTINUED] amLODipine (NORVASC) 5 MG tablet Take 5 mg by mouth daily.   [DISCONTINUED] losartan (COZAAR) 100 MG tablet Take 100 mg by mouth daily.     Allergies:    Talwin [pentazocine]   Social History: Social History   Socioeconomic History   Marital status: Married    Spouse name: Not on file   Number of children: 2   Years of education: Not on file   Highest education level: Not on file  Occupational History   Occupation: Retired - Chemical engineer  Tobacco Use   Smoking status: Former    Packs/day: 0.50    Years: 55.00    Pack years: 27.50    Types: Cigarettes    Quit date: 02/2021    Years since quitting: 0.1   Smokeless tobacco: Never  Substance and Sexual Activity   Alcohol use: Never   Drug use: Never   Sexual activity: Not on file  Other Topics Concern   Not on file  Social History Narrative   Not on file   Social Determinants of Health   Financial Resource Strain: Not on file  Food Insecurity: Not on file  Transportation Needs: Not on file  Physical Activity: Not on file  Stress: Not on file  Social Connections: Not on file     Family History: The patient's family history includes Stroke in her mother.  ROS:   All other ROS reviewed and negative. Pertinent positives noted in the HPI.     EKGs/Labs/Other Studies Reviewed:   The following studies were personally reviewed by me today:  EKG:  EKG is ordered today.  The ekg ordered today demonstrates normal sinus rhythm heart rate 89, left bundle branch block, QRS 168 ms, and was personally reviewed by me.   NM Stress 02/23/2021 IMPRESSION: 1. No reversible ischemia.  Possible infarct involving the apex.   2. Abnormal left ventricular wall motion with diffuse hypokinesia. Difficult to exclude areas of paradoxical motion.   3. Left ventricular ejection fraction is 29%.   4. Non invasive risk stratification*: Low   *2012 Appropriate Use Criteria for Coronary Revascularization Focused  Update: J Am Coll Cardiol. 1610;96(0):454-098. http://content.airportbarriers.com.aspx?articleid=1201161  TTE 02/23/2021  1. Left ventricular ejection fraction, by estimation, is 25 to 30%. The  left ventricle has severely decreased function. The left ventricle  demonstrates regional wall motion abnormalities. Global hypokinesis with  septal dyskinesis. Left ventricular  diastolic parameters are consistent with Grade II diastolic dysfunction  (pseudonormalization). Elevated left atrial pressure.   2. Right ventricular systolic function is normal. The right ventricular  size is normal. Moderately increased right ventricular wall thickness.  Tricuspid regurgitation signal is inadequate for assessing PA pressure.   3. The mitral valve is  normal in structure. Mild mitral valve  regurgitation.   4. The aortic valve was not well visualized. Aortic valve regurgitation  is not visualized. No aortic stenosis is present.   5. The inferior vena cava is normal in size with greater than 50%  respiratory variability, suggesting right atrial pressure of 3 mmHg.   Recent Labs: 02/22/2021: B Natriuretic Peptide 226.6 02/23/2021: ALT 16; BUN 7; Creatinine, Ser 0.83; Hemoglobin 12.5; Magnesium 1.6; Platelets 197; Potassium 3.6; Sodium 138   Recent Lipid Panel No results found for: CHOL, TRIG, HDL, CHOLHDL, VLDL, LDLCALC, LDLDIRECT  Physical Exam:   VS:  BP 126/60 (BP Location: Left Arm, Patient Position: Sitting, Cuff Size: Large)    Pulse 89    Ht 5\' 2"  (1.575 m)    Wt 148 lb 9.6 oz (67.4 kg)    SpO2 95%    BMI 27.18 kg/m    Wt Readings from Last 3 Encounters:  04/24/21 148 lb 9.6 oz (67.4 kg)  02/23/21 138 lb 10.7 oz (62.9 kg)    General: Well nourished, well developed, in no acute distress Head: Atraumatic, normal size  Eyes: PEERLA, EOMI  Neck: Supple, no JVD Endocrine: No thryomegaly Cardiac: Normal S1, S2; RRR; no murmurs, rubs, or gallops Lungs: Clear to auscultation bilaterally, no  wheezing, rhonchi or rales  Abd: Soft, nontender, no hepatomegaly  Ext: 1+ pitting edema Musculoskeletal: No deformities, BUE and BLE strength normal and equal Skin: Warm and dry, no rashes   Neuro: Alert and oriented to person, place, time, and situation, CNII-XII grossly intact, no focal deficits  Psych: Normal mood and affect   ASSESSMENT:   THAILA BOTTOMS is a 76 y.o. female who presents for the following: 1. Chronic systolic heart failure ([place])   2. LBBB (left bundle branch block)   3. Primary hypertension   4. Mixed hyperlipidemia     PLAN:   1. Chronic systolic heart failure ([place]) 2. LBBB (left bundle branch block) -Recent diagnosis of congestive heart failure in December 2022.  EKG demonstrates a left bundle branch block with wide QRS 168 ms.  Stress test performed in the hospital shows evidence of prior infarct.  She does have evidence of coronary calcifications.  Apparently none of this was followed up with her.  Her ejection fraction is 25-30%.  She is now on appropriate medical therapy. -Stop amlodipine and losartan.  Start Coreg 3.125 mg twice daily, Entresto 24-26 mg twice daily, Farxiga 10 mg daily.  She will continue with Lasix 40 mg daily.  Suspect she will have improvement in her edema with GDMT. -Regarding the etiology she needs left heart catheterization.  I am concerned given that her stress test shows infarction as well as heavy coronary calcifications on chest CT.  We will exclude this with a left and right heart catheterization next week.  Risk and benefits explained.  She is willing to proceed.  See consent statement below. -She also has a left bundle branch block with a very wide QRS.  She has evidence of LV dyssynchrony.  This could be contributing.  Regardless we will exclude CAD.  We will titrate medical therapy.  I will see her back in 2 weeks.  She will undergo left heart catheterization next week.  Shared Decision Making/Informed Consent The risks [stroke (1  in 1000), death (1 in 1000), kidney failure [usually temporary] (1 in 500), bleeding (1 in 200), allergic reaction [possibly serious] (1 in 200)], benefits (diagnostic support and management of coronary artery disease) and  alternatives of a cardiac catheterization were discussed in detail with Ms. Allbaugh and she is willing to proceed.  3. Primary hypertension -Transition to Coreg, [place], [place] above.  We will add Aldactone when we see her back.  4. Mixed hyperlipidemia -Continue Crestor.  Well-controlled.   Shared Decision Making/Informed Consent The risks [stroke (1 in 1000), death (1 in 1000), kidney failure [usually temporary] (1 in 500), bleeding (1 in 200), allergic reaction [possibly serious] (1 in 200)], benefits (diagnostic support and management of coronary artery disease) and alternatives of a cardiac catheterization were discussed in detail with Ms. Christian and she is willing to proceed.  Disposition: Return in about 2 weeks (around 05/08/2021).  Medication Adjustments/Labs and Tests Ordered: Current medicines are reviewed at length with the patient today.  Concerns regarding medicines are outlined above.  Orders Placed This Encounter  Procedures   HIV Antibody (routine testing w rflx)   Basic metabolic panel   Brain natriuretic peptide   CBC   Hemoglobin A1c   EKG 12-Lead   Meds ordered this encounter  Medications   carvedilol (COREG) 3.125 MG tablet    Sig: Take 1 tablet (3.125 mg total) by mouth 2 (two) times daily.    Dispense:  180 tablet    Refill:  3   dapagliflozin propanediol (FARXIGA) 10 MG TABS tablet    Sig: Take 1 tablet (10 mg total) by mouth daily before breakfast.    Dispense:  90 tablet    Refill:  1    Patient Instructions  Medication Instructions:  STOP Losartan  STOP Amlodipine  START Coreg 3.125 mg twice daily  START Fargixa 10 mg daily  START Entresto 24-26 (use samples provided until you come back to see [place])  START baby aspirin 81 mg  daily   *If you need a refill on your cardiac medications before your next appointment, please call your pharmacy*   Lab Work: BMET, CBC, BNP, HIV today   If you have labs (blood work) drawn today and your tests are completely normal, you will receive your results only by: MyChart Message (if you have MyChart) OR A paper copy in the mail If you have any lab test that is abnormal or we need to change your treatment, we will call you to review the results.   Testing/Procedures: Your physician has requested that you have a cardiac catheterization. Cardiac catheterization is used to diagnose and/or treat various heart conditions. Doctors may recommend this procedure for a number of different reasons. The most common reason is to evaluate chest pain. Chest pain can be a symptom of coronary artery disease (CAD), and cardiac catheterization can show whether plaque is narrowing or blocking your hearts arteries. This procedure is also used to evaluate the valves, as well as measure the blood flow and oxygen levels in different parts of your heart. For further information please visit [email or web address]. Please follow instruction sheet, as given.    Follow-Up: At [facility], you and your health needs are our priority.  As part of our continuing mission to provide you with exceptional heart care, we have created designated Provider Care Teams.  These Care Teams include your primary Cardiologist (physician) and Advanced Practice Providers (APPs -  Physician Assistants and Nurse Practitioners) who all work together to provide you with the care you need, when you need it.  We recommend signing up for the patient portal called "MyChart".  Sign up information is provided on this After Visit Summary.  MyChart is used to  connect with patients for Virtual Visits (Telemedicine).  Patients are able to view lab/test results, encounter notes, upcoming appointments, etc.  Non-urgent messages can be sent to your  provider as well.   To learn more about what you can do with MyChart, go to [email or web address].    Your next appointment:   February 16th at 2:00 PM   The format for your next appointment:   In Person  Provider:   Eleonore Chiquito, MD    Other Instructions  [place] [address] Dept: [phone] Loc: [place]  04/24/2021  You are scheduled for a Cardiac Catheterization on Tuesday, February 7 with Dr. Peter Martinique.  1. Please arrive at the [facility] (Main Entrance A) at [facility]: [address] [place], [place] [address] at 5:30 AM (This time is two hours before your procedure to ensure your preparation). Free valet parking service is available.   Special note: Every effort is made to have your procedure done on time. Please understand that emergencies sometimes delay scheduled procedures.  2. Diet: Do not eat solid foods after midnight.  The patient may have clear liquids until 5am upon the day of the procedure.  3. Labs: You will need to have blood drawn today- You do not need to be fasting.  4. Medication instructions in preparation for your procedure:   Contrast Allergy: No  Hold Entresto the day of, and Lasix the day of  On the morning of your procedure, take your Aspirin and any morning medicines NOT listed above.  You may use sips of water.  5. Plan for one night stay--bring personal belongings. 6. Bring a current list of your medications and current insurance cards. 7. You MUST have a responsible person to drive you home. 8. Someone MUST be with you the first 24 hours after you arrive home or your discharge will be delayed. 9. Please wear clothes that are easy to get on and off and wear slip-on shoes.  Thank you for allowing [place] to care for you!   -- [facility]      Signed, Addison Naegeli. Audie Box, MD, [place]  [address], [place], [place] [address] [phone]  04/24/2021 2:41 PM

## 2021-04-24 ENCOUNTER — Ambulatory Visit: Payer: PPO | Admitting: Cardiovascular Disease

## 2021-04-24 ENCOUNTER — Encounter: Payer: Self-pay | Admitting: Cardiovascular Disease

## 2021-04-24 ENCOUNTER — Other Ambulatory Visit: Payer: Self-pay

## 2021-04-24 VITALS — BP 126/60 | HR 89 | Ht 62.0 in | Wt 148.6 lb

## 2021-04-24 DIAGNOSIS — I10 Essential (primary) hypertension: Secondary | ICD-10-CM | POA: Diagnosis not present

## 2021-04-24 DIAGNOSIS — I447 Left bundle-branch block, unspecified: Secondary | ICD-10-CM

## 2021-04-24 DIAGNOSIS — E782 Mixed hyperlipidemia: Secondary | ICD-10-CM | POA: Diagnosis not present

## 2021-04-24 DIAGNOSIS — I5022 Chronic systolic (congestive) heart failure: Secondary | ICD-10-CM

## 2021-04-24 MED ORDER — DAPAGLIFLOZIN PROPANEDIOL 10 MG PO TABS: 10.0000 mg | ORAL_TABLET | Freq: Every day | ORAL | 1 refills | Status: DC

## 2021-04-24 MED ORDER — CARVEDILOL 3.125 MG PO TABS: 3.1250 mg | ORAL_TABLET | Freq: Two times a day (BID) | ORAL | 3 refills | Status: DC

## 2021-04-24 NOTE — Patient Instructions (Signed)
Medication Instructions:  STOP Losartan  STOP Amlodipine  START Coreg 3.125 mg twice daily  START Fargixa 10 mg daily  START Entresto 24-26 (use samples provided until you come back to see [place])  START baby aspirin 81 mg daily   *If you need a refill on your cardiac medications before your next appointment, please call your pharmacy*   Lab Work: BMET, CBC, BNP, HIV today   If you have labs (blood work) drawn today and your tests are completely normal, you will receive your results only by: [place] (if you have MyChart) OR A paper copy in the mail If you have any lab test that is abnormal or we need to change your treatment, we will call you to review the results.   Testing/Procedures: Your physician has requested that you have a cardiac catheterization. Cardiac catheterization is used to diagnose and/or treat various heart conditions. Doctors may recommend this procedure for a number of different reasons. The most common reason is to evaluate chest pain. Chest pain can be a symptom of coronary artery disease (CAD), and cardiac catheterization can show whether plaque is narrowing or blocking your hearts arteries. This procedure is also used to evaluate the valves, as well as measure the blood flow and oxygen levels in different parts of your heart. For further information please visit [email or web address]. Please follow instruction sheet, as given.    Follow-Up: At [facility], you and your health needs are our priority.  As part of our continuing mission to provide you with exceptional heart care, we have created designated Provider Care Teams.  These Care Teams include your primary Cardiologist (physician) and Advanced Practice Providers (APPs -  Physician Assistants and Nurse Practitioners) who all work together to provide you with the care you need, when you need it.  We recommend signing up for the patient portal called "MyChart".  Sign up information is provided on this  After Visit Summary.  MyChart is used to connect with patients for Virtual Visits (Telemedicine).  Patients are able to view lab/test results, encounter notes, upcoming appointments, etc.  Non-urgent messages can be sent to your provider as well.   To learn more about what you can do with MyChart, go to [email or web address].    Your next appointment:   February 16th at 2:00 PM   The format for your next appointment:   In Person  Provider:   Eleonore Chiquito, MD    Other Instructions  [place] [address] Dept: [phone] Loc: [place]  04/24/2021  You are scheduled for a Cardiac Catheterization on Tuesday, February 7 with Dr. Peter Martinique.  1. Please arrive at the [facility] (Main Entrance A) at [facility]: [address] [place], [place] [address] at 5:30 AM (This time is two hours before your procedure to ensure your preparation). Free valet parking service is available.   Special note: Every effort is made to have your procedure done on time. Please understand that emergencies sometimes delay scheduled procedures.  2. Diet: Do not eat solid foods after midnight.  The patient may have clear liquids until 5am upon the day of the procedure.  3. Labs: You will need to have blood drawn today- You do not need to be fasting.  4. Medication instructions in preparation for your procedure:   Contrast Allergy: No  Hold Entresto the day of, and Lasix the day of  On the morning of your procedure, take your Aspirin and any morning medicines NOT listed above.  You may use sips of water.  5. Plan for one night stay--bring personal belongings. 6. Bring a current list of your medications and current insurance cards. 7. You MUST have a responsible person to drive you home. 8. Someone MUST be with you the first 24 hours after you arrive home or  your discharge will be delayed. 9. Please wear clothes that are easy to get on and off and wear slip-on shoes.  Thank you for allowing [place] to care for you!   -- [place] [facility]

## 2021-04-30 ENCOUNTER — Telehealth: Payer: Self-pay | Admitting: *Deleted

## 2021-04-30 ENCOUNTER — Telehealth: Payer: Self-pay | Admitting: Cardiovascular Disease

## 2021-04-30 LAB — HEMOGLOBIN A1C
Est. average glucose Bld gHb Est-mCnc: 146 mg/dL
Hgb A1c MFr Bld: 6.7 % — ABNORMAL HIGH (ref 4.8–5.6)

## 2021-04-30 LAB — BASIC METABOLIC PANEL
BUN/Creatinine Ratio: 12 (ref 12–28)
BUN: 14 mg/dL (ref 8–27)
CO2: 22 mmol/L (ref 20–29)
Calcium: 10.4 mg/dL — ABNORMAL HIGH (ref 8.7–10.3)
Chloride: 101 mmol/L (ref 96–106)
Creatinine, Ser: 1.14 mg/dL — ABNORMAL HIGH (ref 0.57–1.00)
Glucose: 130 mg/dL — ABNORMAL HIGH (ref 70–99)
Potassium: 4.3 mmol/L (ref 3.5–5.2)
Sodium: 148 mmol/L — ABNORMAL HIGH (ref 134–144)
eGFR: 51 mL/min/{1.73_m2} — ABNORMAL LOW (ref >59)

## 2021-04-30 LAB — BRAIN NATRIURETIC PEPTIDE: BNP: 52.7 pg/mL (ref 0.0–100.0)

## 2021-04-30 LAB — CBC
Hematocrit: 39.4 % (ref 34.0–46.6)
Hemoglobin: 13.7 g/dL (ref 11.1–15.9)
MCH: 30.6 pg (ref 26.6–33.0)
MCHC: 34.8 g/dL (ref 31.5–35.7)
MCV: 88 fL (ref 79–97)
Platelets: 187 10*3/uL (ref 150–450)
RBC: 4.47 x10E6/uL (ref 3.77–5.28)
RDW: 12.9 % (ref 11.7–15.4)
WBC: 8.5 10*3/uL (ref 3.4–10.8)

## 2021-04-30 LAB — HIV ANTIBODY (ROUTINE TESTING W REFLEX): HIV Screen 4th Generation wRfx: NONREACTIVE

## 2021-04-30 NOTE — Telephone Encounter (Signed)
Cardiac catheterization scheduled at [facility] for: Tuesday May 01, 2021 7:30 [facility] Main Entrance A [facility]) at: 5:30 AM   Diet-no solid food after midnight prior to cath, clear liquids until 5 AM day of procedure.  Medication instructions for procedure: -Hold:  Lasix-AM of procedure  Farxiga-AM of procedure -Except hold medications usual morning medications can be taken pre-cath with sips of water including aspirin 81 mg.    Must have responsible adult to drive home post procedure and be with patient first 24 hours after arriving home.  [facility] does allow one visitor to accompany you and wait in the hospital waiting room while you are there for your procedure. You and your visitor will be asked to wear a mask once you enter the hospital.   Patient reports does not currently have any new symptoms concerning for COVID-19 and no household members with COVID-19 like illness.  Reviewed procedure instructions with patient.  BMP 04/24/21 not resulted-[facility] lab tech aware no result, still no result at this time.  Patient aware if no result 04/24/21 BMP on arrival to Short Stay in morning, will need BMP at that time.

## 2021-04-30 NOTE — Telephone Encounter (Signed)
Pt reaching out for information about upcoming procedure... please advise

## 2021-04-30 NOTE — Telephone Encounter (Signed)
Called patient, patient had question regarding her upcoming right and left heart cath.   Patient verbalized understanding, any and all questions were answered.

## 2021-05-01 ENCOUNTER — Ambulatory Visit (HOSPITAL_COMMUNITY)
Admission: RE | Admit: 2021-05-01 | Discharge: 2021-05-01 | Disposition: A | Discharge status: Home / Self Care | Payer: PPO | Source: Ambulatory Visit | Attending: Cardiology | Admitting: Cardiology

## 2021-05-01 ENCOUNTER — Encounter (HOSPITAL_COMMUNITY): Payer: Self-pay | Admitting: Cardiology

## 2021-05-01 ENCOUNTER — Encounter (HOSPITAL_COMMUNITY)
Admission: RE | Disposition: A | Discharge status: Home / Self Care | Payer: Self-pay | Source: Ambulatory Visit | Attending: Cardiology

## 2021-05-01 ENCOUNTER — Other Ambulatory Visit: Payer: Self-pay

## 2021-05-01 DIAGNOSIS — I447 Left bundle-branch block, unspecified: Secondary | ICD-10-CM | POA: Insufficient documentation

## 2021-05-01 DIAGNOSIS — I2510 Atherosclerotic heart disease of native coronary artery without angina pectoris: Secondary | ICD-10-CM

## 2021-05-01 DIAGNOSIS — Z87891 Personal history of nicotine dependence: Secondary | ICD-10-CM | POA: Insufficient documentation

## 2021-05-01 DIAGNOSIS — Z79899 Other long term (current) drug therapy: Secondary | ICD-10-CM | POA: Diagnosis not present

## 2021-05-01 DIAGNOSIS — J449 Chronic obstructive pulmonary disease, unspecified: Secondary | ICD-10-CM | POA: Diagnosis present

## 2021-05-01 DIAGNOSIS — Z7984 Long term (current) use of oral hypoglycemic drugs: Secondary | ICD-10-CM | POA: Insufficient documentation

## 2021-05-01 DIAGNOSIS — I10 Essential (primary) hypertension: Secondary | ICD-10-CM | POA: Diagnosis present

## 2021-05-01 DIAGNOSIS — I110 Hypertensive heart disease with heart failure: Secondary | ICD-10-CM | POA: Insufficient documentation

## 2021-05-01 DIAGNOSIS — I5022 Chronic systolic (congestive) heart failure: Secondary | ICD-10-CM | POA: Diagnosis present

## 2021-05-01 DIAGNOSIS — E782 Mixed hyperlipidemia: Secondary | ICD-10-CM | POA: Diagnosis not present

## 2021-05-01 DIAGNOSIS — E7800 Pure hypercholesterolemia, unspecified: Secondary | ICD-10-CM | POA: Diagnosis present

## 2021-05-01 HISTORY — PX: RIGHT/LEFT HEART CATH AND CORONARY ANGIOGRAPHY: CATH118266

## 2021-05-01 LAB — POCT I-STAT 7, (LYTES, BLD GAS, ICA,H+H)
Acid-Base Excess: 2 mmol/L (ref 0.0–2.0)
Bicarbonate: 28.2 mmol/L — ABNORMAL HIGH (ref 20.0–28.0)
Calcium, Ion: 1.17 mmol/L (ref 1.15–1.40)
HCT: 36 % (ref 36.0–46.0)
Hemoglobin: 12.2 g/dL (ref 12.0–15.0)
O2 Saturation: 97 %
Potassium: 3.6 mmol/L (ref 3.5–5.1)
Sodium: 139 mmol/L (ref 135–145)
TCO2: 30 mmol/L (ref 22–32)
pCO2 arterial: 49.1 mmHg — ABNORMAL HIGH (ref 32.0–48.0)
pH, Arterial: 7.367 (ref 7.350–7.450)
pO2, Arterial: 98 mmHg (ref 83.0–108.0)

## 2021-05-01 LAB — POCT I-STAT EG7
Acid-Base Excess: 3 mmol/L — ABNORMAL HIGH (ref 0.0–2.0)
Bicarbonate: 30.5 mmol/L — ABNORMAL HIGH (ref 20.0–28.0)
Calcium, Ion: 1.21 mmol/L (ref 1.15–1.40)
HCT: 36 % (ref 36.0–46.0)
Hemoglobin: 12.2 g/dL (ref 12.0–15.0)
O2 Saturation: 70 %
Potassium: 3.6 mmol/L (ref 3.5–5.1)
Sodium: 140 mmol/L (ref 135–145)
TCO2: 32 mmol/L (ref 22–32)
pCO2, Ven: 59.9 mmHg (ref 44.0–60.0)
pH, Ven: 7.316 (ref 7.250–7.430)
pO2, Ven: 41 mmHg (ref 32.0–45.0)

## 2021-05-01 LAB — BASIC METABOLIC PANEL
Anion gap: 10 (ref 5–15)
BUN: 18 mg/dL (ref 8–23)
CO2: 28 mmol/L (ref 22–32)
Calcium: 8.8 mg/dL — ABNORMAL LOW (ref 8.9–10.3)
Chloride: 99 mmol/L (ref 98–111)
Creatinine, Ser: 1.49 mg/dL — ABNORMAL HIGH (ref 0.44–1.00)
GFR, Estimated: 37 mL/min — ABNORMAL LOW (ref >60)
Glucose, Bld: 133 mg/dL — ABNORMAL HIGH (ref 70–99)
Potassium: 3.8 mmol/L (ref 3.5–5.1)
Sodium: 137 mmol/L (ref 135–145)

## 2021-05-01 SURGERY — RIGHT/LEFT HEART CATH AND CORONARY ANGIOGRAPHY
Anesthesia: LOCAL

## 2021-05-01 MED ORDER — VERAPAMIL HCL 2.5 MG/ML IV SOLN
INTRAVENOUS | Status: AC
  Filled 2021-05-01: qty 2

## 2021-05-01 MED ORDER — SODIUM CHLORIDE 0.9 % IV SOLN: 250.0000 mL | INTRAVENOUS | Status: DC | PRN

## 2021-05-01 MED ORDER — ONDANSETRON HCL 4 MG/2ML IJ SOLN: 4.0000 mg | Freq: Four times a day (QID) | INTRAMUSCULAR | Status: DC | PRN

## 2021-05-01 MED ORDER — HEPARIN (PORCINE) IN NACL 1000-0.9 UT/500ML-% IV SOLN
INTRAVENOUS | Status: AC
  Filled 2021-05-01: qty 500

## 2021-05-01 MED ORDER — MIDAZOLAM HCL 2 MG/2ML IJ SOLN
INTRAMUSCULAR | Status: AC
  Filled 2021-05-01: qty 2

## 2021-05-01 MED ORDER — HEPARIN SODIUM (PORCINE) 1000 UNIT/ML IJ SOLN
INTRAMUSCULAR | Status: AC
  Filled 2021-05-01: qty 10

## 2021-05-01 MED ORDER — SODIUM CHLORIDE 0.9% FLUSH: 3.0000 mL | INTRAVENOUS | Status: DC | PRN

## 2021-05-01 MED ORDER — HEPARIN SODIUM (PORCINE) 1000 UNIT/ML IJ SOLN
INTRAMUSCULAR | Status: DC | PRN
  Administered 2021-05-01: 3000 [IU] via INTRAVENOUS

## 2021-05-01 MED ORDER — SODIUM CHLORIDE 0.9 % IV SOLN: INTRAVENOUS | Status: DC

## 2021-05-01 MED ORDER — SODIUM CHLORIDE 0.9% FLUSH: 3.0000 mL | Freq: Two times a day (BID) | INTRAVENOUS | Status: DC

## 2021-05-01 MED ORDER — LIDOCAINE HCL (PF) 1 % IJ SOLN
INTRAMUSCULAR | Status: DC | PRN
  Administered 2021-05-01 (×2): 2 mL

## 2021-05-01 MED ORDER — ACETAMINOPHEN 325 MG PO TABS: 650.0000 mg | ORAL_TABLET | ORAL | Status: DC | PRN

## 2021-05-01 MED ORDER — HEPARIN (PORCINE) IN NACL 1000-0.9 UT/500ML-% IV SOLN
INTRAVENOUS | Status: DC | PRN
  Administered 2021-05-01 (×2): 500 mL

## 2021-05-01 MED ORDER — VERAPAMIL HCL 2.5 MG/ML IV SOLN
INTRAVENOUS | Status: DC | PRN
  Administered 2021-05-01: 10 mL via INTRA_ARTERIAL

## 2021-05-01 MED ORDER — ASPIRIN 81 MG PO CHEW: 81.0000 mg | CHEWABLE_TABLET | ORAL | Status: DC

## 2021-05-01 MED ORDER — SODIUM CHLORIDE 0.9 % WEIGHT BASED INFUSION: 1.0000 mL/kg/h | INTRAVENOUS | Status: DC

## 2021-05-01 MED ORDER — IOHEXOL 350 MG/ML SOLN
INTRAVENOUS | Status: DC | PRN
  Administered 2021-05-01: 40 mL

## 2021-05-01 MED ORDER — MIDAZOLAM HCL 2 MG/2ML IJ SOLN
INTRAMUSCULAR | Status: DC | PRN
  Administered 2021-05-01 (×2): 1 mg via INTRAVENOUS

## 2021-05-01 MED ORDER — FENTANYL CITRATE (PF) 100 MCG/2ML IJ SOLN
INTRAMUSCULAR | Status: AC
  Filled 2021-05-01: qty 2

## 2021-05-01 MED ORDER — LIDOCAINE HCL (PF) 1 % IJ SOLN
INTRAMUSCULAR | Status: AC
  Filled 2021-05-01: qty 30

## 2021-05-01 MED ORDER — FENTANYL CITRATE (PF) 100 MCG/2ML IJ SOLN
INTRAMUSCULAR | Status: DC | PRN
  Administered 2021-05-01: 25 ug via INTRAVENOUS

## 2021-05-01 SURGICAL SUPPLY — 11 items
CATH 5FR JL3.5 JR4 ANG PIG MP (CATHETERS) ×1 IMPLANT
CATH SWAN GANZ 7F STRAIGHT (CATHETERS) ×1 IMPLANT
DEVICE RAD COMP TR BAND LRG (VASCULAR PRODUCTS) ×1 IMPLANT
GLIDESHEATH SLEND SS 6F .021 (SHEATH) ×1 IMPLANT
GLIDESHEATH SLENDER 7FR .021G (SHEATH) ×1 IMPLANT
GUIDEWIRE INQWIRE 1.5J.035X260 (WIRE) IMPLANT
INQWIRE 1.5J .035X260CM (WIRE) ×2
KIT HEART LEFT (KITS) ×2 IMPLANT
PACK CARDIAC CATHETERIZATION (CUSTOM PROCEDURE TRAY) ×2 IMPLANT
TRANSDUCER W/STOPCOCK (MISCELLANEOUS) ×2 IMPLANT
TUBING CIL FLEX 10 FLL-RA (TUBING) ×2 IMPLANT

## 2021-05-01 NOTE — Interval H&P Note (Signed)
History and Physical Interval Note:  05/01/2021 7:31 AM  Melinda Gomez  has presented today for surgery, with the diagnosis of heart failure.  The various methods of treatment have been discussed with the patient and family. After consideration of risks, benefits and other options for treatment, the patient has consented to  Procedure(s): RIGHT/LEFT HEART CATH AND CORONARY ANGIOGRAPHY (N/A) as a surgical intervention.  The patient's history has been reviewed, patient examined, no change in status, stable for surgery.  I have reviewed the patient's chart and labs.  Questions were answered to the patient's satisfaction.    Cath Lab Visit (complete for each Cath Lab visit)  Clinical Evaluation Leading to the Procedure:   ACS: No.  Non-ACS:    Anginal Classification: CCS I  Anti-ischemic medical therapy: Maximal Therapy (2 or more classes of medications)  Non-Invasive Test Results: Intermediate-risk stress test findings: cardiac mortality 1-3%/year  Prior CABG: No previous CABG       Melinda Gomez [facility] 05/01/2021 7:32 AM

## 2021-05-02 LAB — POCT I-STAT EG7
Acid-Base Excess: 3 mmol/L — ABNORMAL HIGH (ref 0.0–2.0)
Bicarbonate: 29.9 mmol/L — ABNORMAL HIGH (ref 20.0–28.0)
Calcium, Ion: 1.19 mmol/L (ref 1.15–1.40)
HCT: 36 % (ref 36.0–46.0)
Hemoglobin: 12.2 g/dL (ref 12.0–15.0)
O2 Saturation: 73 %
Potassium: 3.6 mmol/L (ref 3.5–5.1)
Sodium: 140 mmol/L (ref 135–145)
TCO2: 32 mmol/L (ref 22–32)
pCO2, Ven: 58.2 mmHg (ref 44.0–60.0)
pH, Ven: 7.319 (ref 7.250–7.430)
pO2, Ven: 43 mmHg (ref 32.0–45.0)

## 2021-05-07 DIAGNOSIS — Z0000 Encounter for general adult medical examination without abnormal findings: Secondary | ICD-10-CM | POA: Diagnosis not present

## 2021-05-07 DIAGNOSIS — E782 Mixed hyperlipidemia: Secondary | ICD-10-CM | POA: Diagnosis not present

## 2021-05-07 DIAGNOSIS — R7301 Impaired fasting glucose: Secondary | ICD-10-CM | POA: Diagnosis not present

## 2021-05-07 DIAGNOSIS — I5020 Unspecified systolic (congestive) heart failure: Secondary | ICD-10-CM | POA: Diagnosis not present

## 2021-05-07 DIAGNOSIS — J449 Chronic obstructive pulmonary disease, unspecified: Secondary | ICD-10-CM | POA: Diagnosis not present

## 2021-05-07 DIAGNOSIS — I700 Atherosclerosis of aorta: Secondary | ICD-10-CM | POA: Diagnosis not present

## 2021-05-07 DIAGNOSIS — F325 Major depressive disorder, single episode, in full remission: Secondary | ICD-10-CM | POA: Diagnosis not present

## 2021-05-07 DIAGNOSIS — I10 Essential (primary) hypertension: Secondary | ICD-10-CM | POA: Diagnosis not present

## 2021-05-07 DIAGNOSIS — G2581 Restless legs syndrome: Secondary | ICD-10-CM | POA: Diagnosis not present

## 2021-05-07 DIAGNOSIS — F3341 Major depressive disorder, recurrent, in partial remission: Secondary | ICD-10-CM | POA: Diagnosis not present

## 2021-05-08 DIAGNOSIS — I5022 Chronic systolic (congestive) heart failure: Secondary | ICD-10-CM | POA: Diagnosis not present

## 2021-05-08 DIAGNOSIS — Z6826 Body mass index (BMI) 26.0-26.9, adult: Secondary | ICD-10-CM | POA: Diagnosis not present

## 2021-05-08 DIAGNOSIS — F330 Major depressive disorder, recurrent, mild: Secondary | ICD-10-CM | POA: Diagnosis not present

## 2021-05-08 DIAGNOSIS — D692 Other nonthrombocytopenic purpura: Secondary | ICD-10-CM | POA: Diagnosis not present

## 2021-05-09 DIAGNOSIS — R7301 Impaired fasting glucose: Secondary | ICD-10-CM | POA: Diagnosis not present

## 2021-05-09 NOTE — Progress Notes (Signed)
Cardiology Office Note:   Date:  05/10/2021  NAME:  Melinda Gomez    MRN: [id number] DOB:  [birth year]   PCP:  Lavone Orn, MD  Cardiologist:  None  Electrophysiologist:  None   Referring MD: Lavone Orn, MD   Chief Complaint  Patient presents with   Follow-up         History of Present Illness:   Melinda Gomez is a 76 y.o. female with a hx of systolic HF, LBBB, HLD who presents for follow-up.  She reports she is doing well since her left heart catheterization.  She denies any chest pain or trouble breathing.  She has become more active.  She does have some edema on exam.  Right radial cath site is clean and dry.  We discussed results of her cardiac catheterization.  No significant CAD.  Her pressures are normalized.  She has some edema.  Apparently her and her husband freeze food.  They used salt to freeze it.  They will not need this anymore.  We discussed that she likely will end up needing a pacemaker at some point given her wide QRS so we will try medical therapy first.  She presents with her daughter as well.  Most recent potassium from primary care office 4.0.  We will continue with further titration of therapy.  Denies any major symptoms in office.  Problem List COPD/Tobacco abuse HTN HLD LBBB, QRS 347 ms Systolic HF (non-ischemic) -02/2021: EF 25-30% -MPI infarct 02/23/2021 -LHC 30% RCA (non-obstructive) 6. HLD -T chol 125, HDL 52, LDL 43, TG 186 7. DM -A1c 6.7  Past Medical History: Past Medical History:  Diagnosis Date   COPD (chronic obstructive pulmonary disease) (HCC)    GAD (generalized anxiety disorder)    GERD (gastroesophageal reflux disease)    Hypercholesteremia    Hypertension     Past Surgical History: Past Surgical History:  Procedure Laterality Date   ABDOMINAL HYSTERECTOMY     BLADDER SURGERY     RIGHT/LEFT HEART CATH AND CORONARY ANGIOGRAPHY N/A 05/01/2021   Procedure: RIGHT/LEFT HEART CATH AND CORONARY ANGIOGRAPHY;  Surgeon: Martinique,  Peter M, MD;  Location: [facility];  Service: Cardiovascular;  Laterality: N/A;    Current Medications: Current Meds  Medication Sig   albuterol (VENTOLIN HFA) 108 (90 Base) MCG/ACT inhaler 2 puffs every 4 (four) hours as needed for shortness of breath.   ALPRAZolam (XANAX) 0.5 MG tablet TAKE ONE TABLET BY MOUTH 3 TIMES DAILY AS NEEDED FOR ANXIETY.   B Complex Vitamins (VITAMIN B COMPLEX) TABS Take 1 tablet by mouth daily.   Cholecalciferol (VITAMIN D3) 125 MCG (5000 UT) TABS Take 5,000 Units by mouth daily.   cholestyramine (QUESTRAN) 4 GM/DOSE powder Take by mouth See admin instructions. 13 grams added to 2-6 oz of water as needed   cyclobenzaprine (FLEXERIL) 10 MG tablet Take 10 mg by mouth daily in the afternoon.   dapagliflozin propanediol (FARXIGA) 10 MG TABS tablet Take 1 tablet (10 mg total) by mouth daily before breakfast.   diphenhydrAMINE (BENADRYL) 25 mg capsule Take 25 mg by mouth every 6 (six) hours as needed for allergies.   Fluticasone-Umeclidin-Vilant (TRELEGY ELLIPTA) 100-62.5-25 MCG/ACT AEPB Inhale 1 puff into the lungs daily as needed (Shortness of breath).   furosemide (LASIX) 40 MG tablet Take 40 mg by mouth daily.   Garlic (GARLIQUE PO) Take 1 tablet by mouth daily.   OVER THE COUNTER MEDICATION Take 3 tablets by mouth daily. Balance of [employer]  and Vegetables   risperiDONE (RISPERDAL) 1 MG tablet TAKE 1 TABLET BY MOUTH AT BEDTIME. FOR MOOD   rosuvastatin (CRESTOR) 20 MG tablet Take 20 mg by mouth daily.   sacubitril-valsartan (ENTRESTO) 49-51 MG Take 1 tablet by mouth 2 (two) times daily.   spironolactone (ALDACTONE) 25 MG tablet Take 0.5 tablets (12.5 mg total) by mouth daily.   traMADol (ULTRAM) 50 MG tablet Take 50 mg by mouth daily as needed (migraine).   traZODone (DESYREL) 100 MG tablet 1 to 2 tablets at night as needed for sleep (Patient taking differently: Take 300 mg by mouth at bedtime.)   venlafaxine XR (EFFEXOR-XR) 75 MG 24 hr capsule Take 3  capsules (225 mg total) by mouth daily. (Patient taking differently: Take 75-150 mg by mouth See admin instructions. Take 75 mg at lunch and 150 mg at bedtime)   [DISCONTINUED] aspirin EC 81 MG tablet Take 81 mg by mouth daily. Swallow whole.   [DISCONTINUED] carvedilol (COREG) 3.125 MG tablet Take 1 tablet (3.125 mg total) by mouth 2 (two) times daily.   [DISCONTINUED] sacubitril-valsartan (ENTRESTO) 24-26 MG Take 1 tablet by mouth 2 (two) times daily.     Allergies:    Hydrochlorothiazide, Lisinopril, Norpramin [desipramine], Other, Talwin [pentazocine], and Verapamil   Social History: Social History   Socioeconomic History   Marital status: Married    Spouse name: Not on file   Number of children: 2   Years of education: Not on file   Highest education level: Not on file  Occupational History   Occupation: Retired - Chemical engineer  Tobacco Use   Smoking status: Former    Packs/day: 0.50    Years: 55.00    Pack years: 27.50    Types: Cigarettes    Quit date: 02/2021    Years since quitting: 0.2   Smokeless tobacco: Never  Substance and Sexual Activity   Alcohol use: Never   Drug use: Never   Sexual activity: Not on file  Other Topics Concern   Not on file  Social History Narrative   Not on file   Social Determinants of Health   Financial Resource Strain: Not on file  Food Insecurity: Not on file  Transportation Needs: Not on file  Physical Activity: Not on file  Stress: Not on file  Social Connections: Not on file     Family History: The patient's family history includes Stroke in her mother.  ROS:   All other ROS reviewed and negative. Pertinent positives noted in the HPI.     EKGs/Labs/Other Studies Reviewed:   The following studies were personally reviewed by me today:  EKG:  EKG is ordered today.  The ekg ordered today demonstrates normal sinus rhythm heart rate 79, left bundle branch block, and was personally reviewed by me.   LHC 05/01/2021    Ost RCA lesion is 30% stenosed.   LV end diastolic pressure is normal.   Minor nonobstructive CAD Normal LV filling pressure 15 mm Hg Upper normal right heart pressures. PAP mean 23 mm Hg Normal cardiac output. Index 2.86  TTE 02/23/2021  1. Left ventricular ejection fraction, by estimation, is 25 to 30%. The  left ventricle has severely decreased function. The left ventricle  demonstrates regional wall motion abnormalities. Global hypokinesis with  septal dyskinesis. Left ventricular  diastolic parameters are consistent with Grade II diastolic dysfunction  (pseudonormalization). Elevated left atrial pressure.   2. Right ventricular systolic function is normal. The right ventricular  size is normal.  Moderately increased right ventricular wall thickness.  Tricuspid regurgitation signal is inadequate for assessing PA pressure.   3. The mitral valve is normal in structure. Mild mitral valve  regurgitation.   4. The aortic valve was not well visualized. Aortic valve regurgitation  is not visualized. No aortic stenosis is present.   5. The inferior vena cava is normal in size with greater than 50%  respiratory variability, suggesting right atrial pressure of 3 mmHg.   Recent Labs: 02/23/2021: ALT 16; Magnesium 1.6 04/24/2021: BNP 52.7; Platelets 187 05/01/2021: BUN 18; Creatinine, Ser 1.49; Hemoglobin 12.2; Potassium 3.6; Sodium 140   Recent Lipid Panel No results found for: CHOL, TRIG, HDL, CHOLHDL, VLDL, LDLCALC, LDLDIRECT  Physical Exam:   VS:  BP (!) 152/61    Pulse 79    Ht 5\' 2"  (1.575 m)    Wt 146 lb 9.6 oz (66.5 kg)    SpO2 96%    BMI 26.81 kg/m    Wt Readings from Last 3 Encounters:  05/10/21 146 lb 9.6 oz (66.5 kg)  05/01/21 142 lb (64.4 kg)  04/24/21 148 lb 9.6 oz (67.4 kg)    General: Well nourished, well developed, in no acute distress Head: Atraumatic, normal size  Eyes: PEERLA, EOMI  Neck: Supple, no JVD Endocrine: No thryomegaly Cardiac: Normal S1, S2; RRR; no  murmurs, rubs, or gallops Lungs: Clear to auscultation bilaterally, no wheezing, rhonchi or rales  Abd: Soft, nontender, no hepatomegaly  Ext: 1+ edema in the lower extremities Musculoskeletal: No deformities, BUE and BLE strength normal and equal Skin: Warm and dry, no rashes   Neuro: Alert and oriented to person, place, time, and situation, CNII-XII grossly intact, no focal deficits  Psych: Normal mood and affect   ASSESSMENT:   AVALIN BRILEY is a 76 y.o. female who presents for the following: 1. Chronic systolic heart failure ([place])   2. LBBB (left bundle branch block)   3. Primary hypertension   4. Mixed hyperlipidemia     PLAN:   1. Chronic systolic heart failure ([place]) 2. LBBB (left bundle branch block) -Diagnosed with systolic heart failure.  Left and right heart catheterization showed nonischemic etiology.  She has left bundle branch block with wide QRS.  I suspect this is the etiology.  Regardless she needs medical therapy before consideration for CRT-D.  We will increase her Coreg to 6.25 mg twice daily.  Increase Entresto to 49-51 mg twice daily.  Add Aldactone 12.5 mg daily.  She is on Farxiga 10 mg daily.  She is slightly volume up.  She can take an extra tablet of Lasix this afternoon.  Should be okay while on Aldactone. -She will see me back in roughly 2 months.  We will continue with medical therapy and recheck an echo in 3 to 6 months.  Again I have a strong suspicion left bundle branch block as etiology.  3. Primary hypertension -BP elevated.  Increasing Coreg and Entresto.  4. Mixed hyperlipidemia -Nonobstructive disease 30% lesion noted.  No need for aspirin.  Continue Crestor 20 mg daily.  She is diabetic.  Now on Farxiga.  Continue cholesterol-lowering agents.  Values are at goal.  Disposition: Return in about 2 months (around 07/08/2021).  Medication Adjustments/Labs and Tests Ordered: Current medicines are reviewed at length with the patient today.  Concerns  regarding medicines are outlined above.  Orders Placed This Encounter  Procedures   EKG 12-Lead   Meds ordered this encounter  Medications   carvedilol (COREG) 6.25  MG tablet    Sig: Take 1 tablet (6.25 mg total) by mouth 2 (two) times daily.    Dispense:  180 tablet    Refill:  3   spironolactone (ALDACTONE) 25 MG tablet    Sig: Take 0.5 tablets (12.5 mg total) by mouth daily.    Dispense:  90 tablet    Refill:  3   sacubitril-valsartan (ENTRESTO) 49-51 MG    Sig: Take 1 tablet by mouth 2 (two) times daily.    Dispense:  60 tablet    Refill:  0    Patient Instructions  Medication Instructions:  STOP Aspirin  Increase Coreg to 6.25 mg twice daily  START Aldactone 12.5 mg daily  Use samples of Entresto 49/51 twice daily until you see pharmacy team Take an extra lasix today-   *If you need a refill on your cardiac medications before your next appointment, please call your pharmacy*   Follow-Up: At [facility], you and your health needs are our priority.  As part of our continuing mission to provide you with exceptional heart care, we have created designated Provider Care Teams.  These Care Teams include your primary Cardiologist (physician) and Advanced Practice Providers (APPs -  Physician Assistants and Nurse Practitioners) who all work together to provide you with the care you need, when you need it.  We recommend signing up for the patient portal called "MyChart".  Sign up information is provided on this After Visit Summary.  MyChart is used to connect with patients for Virtual Visits (Telemedicine).  Patients are able to view lab/test results, encounter notes, upcoming appointments, etc.  Non-urgent messages can be sent to your provider as well.   To learn more about what you can do with MyChart, go to [email or web address].    Your next appointment:   2 month(s)  The format for your next appointment:   In Person  Provider:   Eleonore Chiquito, MD    Other  Instructions PHARMD appointment in 2 weeks.     Time Spent with Patient: I have spent a total of 35 minutes with patient reviewing hospital notes, telemetry, EKGs, labs and examining the patient as well as establishing an assessment and plan that was discussed with the patient.  > 50% of time was spent in direct patient care.  Signed, Addison Naegeli. Audie Box, MD, [facility]  [address] [place], [place] [address] [phone]  05/10/2021 3:34 PM

## 2021-05-10 ENCOUNTER — Ambulatory Visit: Payer: PPO | Admitting: Cardiovascular Disease

## 2021-05-10 ENCOUNTER — Other Ambulatory Visit: Payer: Self-pay

## 2021-05-10 ENCOUNTER — Encounter: Payer: Self-pay | Admitting: Cardiovascular Disease

## 2021-05-10 VITALS — BP 152/61 | HR 79 | Ht 62.0 in | Wt 146.6 lb

## 2021-05-10 DIAGNOSIS — I10 Essential (primary) hypertension: Secondary | ICD-10-CM

## 2021-05-10 DIAGNOSIS — I447 Left bundle-branch block, unspecified: Secondary | ICD-10-CM

## 2021-05-10 DIAGNOSIS — E782 Mixed hyperlipidemia: Secondary | ICD-10-CM | POA: Diagnosis not present

## 2021-05-10 DIAGNOSIS — I5022 Chronic systolic (congestive) heart failure: Secondary | ICD-10-CM | POA: Diagnosis not present

## 2021-05-10 MED ORDER — SPIRONOLACTONE 25 MG PO TABS: 12.5000 mg | ORAL_TABLET | Freq: Every day | ORAL | 3 refills | Status: DC

## 2021-05-10 MED ORDER — SACUBITRIL-VALSARTAN 49-51 MG PO TABS: 1.0000 | ORAL_TABLET | Freq: Two times a day (BID) | ORAL | 0 refills | Status: DC

## 2021-05-10 MED ORDER — CARVEDILOL 6.25 MG PO TABS: 6.2500 mg | ORAL_TABLET | Freq: Two times a day (BID) | ORAL | 3 refills | Status: DC

## 2021-05-10 NOTE — Patient Instructions (Signed)
Medication Instructions:  STOP Aspirin  Increase Coreg to 6.25 mg twice daily  START Aldactone 12.5 mg daily  Use samples of Entresto 49/51 twice daily until you see pharmacy team Take an extra lasix today-   *If you need a refill on your cardiac medications before your next appointment, please call your pharmacy*   Follow-Up: At [facility], you and your health needs are our priority.  As part of our continuing mission to provide you with exceptional heart care, we have created designated Provider Care Teams.  These Care Teams include your primary Cardiologist (physician) and Advanced Practice Providers (APPs -  Physician Assistants and Nurse Practitioners) who all work together to provide you with the care you need, when you need it.  We recommend signing up for the patient portal called "MyChart".  Sign up information is provided on this After Visit Summary.  MyChart is used to connect with patients for Virtual Visits (Telemedicine).  Patients are able to view lab/test results, encounter notes, upcoming appointments, etc.  Non-urgent messages can be sent to your provider as well.   To learn more about what you can do with MyChart, go to [email or web address].    Your next appointment:   2 month(s)  The format for your next appointment:   In Person  Provider:   Eleonore Chiquito, MD    Other Instructions PHARMD appointment in 2 weeks.

## 2021-05-21 ENCOUNTER — Other Ambulatory Visit: Payer: Self-pay | Admitting: Cardiovascular Disease

## 2021-05-29 ENCOUNTER — Other Ambulatory Visit: Payer: Self-pay

## 2021-05-29 ENCOUNTER — Ambulatory Visit (INDEPENDENT_AMBULATORY_CARE_PROVIDER_SITE_OTHER): Payer: PPO | Admitting: Pharmacist

## 2021-05-29 VITALS — BP 120/58 | HR 78 | Wt 147.0 lb

## 2021-05-29 DIAGNOSIS — K529 Noninfective gastroenteritis and colitis, unspecified: Secondary | ICD-10-CM | POA: Diagnosis not present

## 2021-05-29 DIAGNOSIS — I5022 Chronic systolic (congestive) heart failure: Secondary | ICD-10-CM | POA: Diagnosis not present

## 2021-05-29 NOTE — Patient Instructions (Signed)
It was nice to see you today ? ?We'll check your labs today and I'll call you tomorrow with results regarding any medication changes ? ?Check the dose of Entresto you're taking at home ? ?Start monitoring and recording blood pressure at home ? ?Take your time when you change positions to help decrease dizziness ?

## 2021-05-29 NOTE — Progress Notes (Signed)
Patient ID: KASHANA BREACH                 DOB: 10-28-1945                      MRN: [id number] ? ? ? ? ?HPI: ?Melinda Gomez is a 76 y.o. female referred by Dr. Audie Box to pharmacy clinic for HF medication management. PMH is significant for non-ischemic cardiomyopathy, systolic HF, LBBB, HLD, COPD. Most recent LVEF 25-30% on 02/2021. At last visit with Dr Audie Box on 2/16, BP was elevated at 152/61. Her carvedilol was increased to 6.'25mg'$  BID, Entresto was increased to 49-'51mg'$  BID and spironolactone 12.'5mg'$  daily was added.  ? ?Today she returns to pharmacy clinic with her husband for further medication titration. Reports SOB with walking, had to stop on her walk in from the parking lot to her visit today. Has swelling in her ankles, worse in her left. Swelling in her right has improved since last visit. Has reported new dizziness since most recent med changes, worse with positional change. Has a BP cuff at home but has not checked since recent med changes. Reports BP previously normal at home though (SBP 120). Isn't sure if she increased her Entresto dose - will check when she gets home. Confirmed she started spironolactone and increased her carvedilol though. ? ?Current CHF meds: Entresto 49-'51mg'$  BID, carvedilol 6.'25mg'$  BID, spironolactone 12.'5mg'$  daily, Farxgia '10mg'$  daily, furosemide '40mg'$  daily ?BP goal: <130/4mHg ? ?Family History: The patient's family history includes Stroke in her mother ? ?Social History: Former smoker 1/2 PPD for 55 years, quit 02/2021. Denies alcohol and drug use. ? ?Diet: Eating more fruits and vegetables. Potassium-based salt substitute. ? ?Exercise: SOB with walking ? ?Wt Readings from Last 3 Encounters:  ?05/10/21 146 lb 9.6 oz (66.5 kg)  ?05/01/21 142 lb (64.4 kg)  ?04/24/21 148 lb 9.6 oz (67.4 kg)  ? ?BP Readings from Last 3 Encounters:  ?05/10/21 (!) 152/61  ?05/01/21 (!) 133/54  ?04/24/21 126/60  ? ?Pulse Readings from Last 3 Encounters:  ?05/10/21 79  ?05/01/21 85  ?04/24/21 89   ? ? ?Renal function: ?CrCl cannot be calculated (Patient's most recent lab result is older than the maximum 21 days allowed.). ? ?Past Medical History:  ?Diagnosis Date  ? COPD (chronic obstructive pulmonary disease) ([place]   ? GAD (generalized anxiety disorder)   ? GERD (gastroesophageal reflux disease)   ? Hypercholesteremia   ? Hypertension   ? ? ?Current Outpatient Medications on File Prior to Visit  ?Medication Sig Dispense Refill  ? albuterol (VENTOLIN HFA) 108 (90 Base) MCG/ACT inhaler 2 puffs every 4 (four) hours as needed for shortness of breath.    ? ALPRAZolam (XANAX) 0.5 MG tablet TAKE ONE TABLET BY MOUTH 3 TIMES DAILY AS NEEDED FOR ANXIETY. 90 tablet 4  ? B Complex Vitamins (VITAMIN B COMPLEX) TABS Take 1 tablet by mouth daily.    ? carvedilol (COREG) 6.25 MG tablet Take 1 tablet (6.25 mg total) by mouth 2 (two) times daily. 180 tablet 3  ? Cholecalciferol (VITAMIN D3) 125 MCG (5000 UT) TABS Take 5,000 Units by mouth daily.    ? cholestyramine (QUESTRAN) 4 GM/DOSE powder Take by mouth See admin instructions. 13 grams added to 2-6 oz of water as needed    ? cyclobenzaprine (FLEXERIL) 10 MG tablet Take 10 mg by mouth daily in the afternoon.    ? dapagliflozin propanediol (FARXIGA) 10 MG TABS tablet Take 1 tablet (10 mg total)  by mouth daily before breakfast. 90 tablet 1  ? diphenhydrAMINE (BENADRYL) 25 mg capsule Take 25 mg by mouth every 6 (six) hours as needed for allergies.    ? Fluticasone-Umeclidin-Vilant (TRELEGY ELLIPTA) 100-62.5-25 MCG/ACT AEPB Inhale 1 puff into the lungs daily as needed (Shortness of breath).    ? furosemide (LASIX) 40 MG tablet Take 40 mg by mouth daily.    ? Garlic (GARLIQUE PO) Take 1 tablet by mouth daily.    ? OVER THE COUNTER MEDICATION Take 3 tablets by mouth daily. Balance of [employer] and Vegetables    ? risperiDONE (RISPERDAL) 1 MG tablet TAKE 1 TABLET BY MOUTH AT BEDTIME. FOR MOOD 90 tablet 3  ? rosuvastatin (CRESTOR) 20 MG tablet Take 20 mg by mouth daily.    ?  sacubitril-valsartan (ENTRESTO) 49-51 MG Take 1 tablet by mouth 2 (two) times daily. 60 tablet 0  ? spironolactone (ALDACTONE) 25 MG tablet Take 0.5 tablets (12.5 mg total) by mouth daily. 90 tablet 3  ? traMADol (ULTRAM) 50 MG tablet Take 50 mg by mouth daily as needed (migraine).    ? traZODone (DESYREL) 100 MG tablet 1 to 2 tablets at night as needed for sleep (Patient taking differently: Take 300 mg by mouth at bedtime.) 180 tablet 2  ? venlafaxine XR (EFFEXOR-XR) 75 MG 24 hr capsule Take 3 capsules (225 mg total) by mouth daily. (Patient taking differently: Take 75-150 mg by mouth See admin instructions. Take 75 mg at lunch and 150 mg at bedtime) 270 capsule 1  ? ?No current facility-administered medications on file prior to visit.  ? ? ?Allergies  ?Allergen Reactions  ? Hydrochlorothiazide   ?  Other reaction(s): hyponatremia  ? Lisinopril   ?  Other reaction(s): cough  ? Norpramin [Desipramine] Other (See Comments)  ?  Jittery  ? Other Other (See Comments)  ?  Estratest  ?depression & palpitation  ? Talwin [Pentazocine] Other (See Comments)  ?  hallucinations  ? Verapamil   ?  Other reaction(s): cough  ? ? ? ?Assessment/Plan: ? ?1. CHF with EF 25-30% - BP well controlled at goal < 130/19mHg with room to further optimize medications. Checking BMET today with recent spironolactone start, pt to confirm when she gets home if she increased her Entresto per last visit. Should currently be taking Entresto 49-'51mg'$  BID, carvedilol 6.'25mg'$  BID, spironolactone 12.'5mg'$  daily, Farxgia '10mg'$  daily, and furosemide '40mg'$  daily for her CHF. Ideally needs Entresto, spironolactone, and carvedilol doses increased but may need to adjust meds slowly given pt dizziness. She will start monitoring and recording BP at home. May change carvedilol to Toprol for less BP lowering to see if this helps improve her dizziness at all. Will call pt tomorrow once labs result to finalize medication plan and schedule follow up. ? ?Maxen Rowland E. Akshat Minehart,  PharmD, BCACP, CPP ?[id number] [place] [address]?Phone: [phone] Fax: [phone]?05/29/2021 2:42 PM ? ? ?

## 2021-05-30 ENCOUNTER — Telehealth: Payer: Self-pay | Admitting: Pharmacist

## 2021-05-30 LAB — BASIC METABOLIC PANEL
BUN/Creatinine Ratio: 16 (ref 12–28)
BUN: 20 mg/dL (ref 8–27)
CO2: 26 mmol/L (ref 20–29)
Calcium: 9.6 mg/dL (ref 8.7–10.3)
Chloride: 100 mmol/L (ref 96–106)
Creatinine, Ser: 1.24 mg/dL — ABNORMAL HIGH (ref 0.57–1.00)
Glucose: 106 mg/dL — ABNORMAL HIGH (ref 70–99)
Potassium: 4.7 mmol/L (ref 3.5–5.2)
Sodium: 139 mmol/L (ref 134–144)
eGFR: 46 mL/min/{1.73_m2} — ABNORMAL LOW (ref >59)

## 2021-05-30 MED ORDER — METOPROLOL SUCCINATE ER 100 MG PO TB24: 100.0000 mg | ORAL_TABLET | Freq: Every day | ORAL | 5 refills | Status: DC

## 2021-05-30 NOTE — Telephone Encounter (Addendum)
BMET overall stable although SCr still a bit higher than baseline (was 0.83 3 months ago, 1.14 1 month ago, increased to 1.49 while in the hospital, now down to 1.24). Some of increase may be secondary to SGLT2i start (SCr was 1.14 the day [place] was added), ok to continue. BP normal yesterday but pt reports some dizziness after most recent med changes (carvedilol dose increase, spironolactone start, and Entresto dose increase for her CHF). Will change carvedilol 6.'25mg'$  BID to Toprol '100mg'$  daily - will be a dose increase to target HR closer to 60 given her CHF but overall hopefully have less effect on BP to see if this helps improve pt symptoms. Will keep an eye on BMET and recheck at next visit with goal for future dose titration of Entresto and spironolactone if able.  ? ?Called pt, she is aware to stop carvedilol and start Toprol. Advised her to take 1/2 tab for the first 3 days to ensure tolerability, then increase to 1 tab ('100mg'$ ) daily. Will f/u in 3 weeks. ?

## 2021-06-14 ENCOUNTER — Telehealth: Payer: Self-pay | Admitting: Pharmacist

## 2021-06-14 DIAGNOSIS — F330 Major depressive disorder, recurrent, mild: Secondary | ICD-10-CM | POA: Diagnosis not present

## 2021-06-14 DIAGNOSIS — Z7951 Long term (current) use of inhaled steroids: Secondary | ICD-10-CM | POA: Diagnosis not present

## 2021-06-14 DIAGNOSIS — I110 Hypertensive heart disease with heart failure: Secondary | ICD-10-CM | POA: Diagnosis not present

## 2021-06-14 DIAGNOSIS — I5022 Chronic systolic (congestive) heart failure: Secondary | ICD-10-CM | POA: Diagnosis not present

## 2021-06-14 DIAGNOSIS — Z6826 Body mass index (BMI) 26.0-26.9, adult: Secondary | ICD-10-CM | POA: Diagnosis not present

## 2021-06-14 DIAGNOSIS — D692 Other nonthrombocytopenic purpura: Secondary | ICD-10-CM | POA: Diagnosis not present

## 2021-06-14 DIAGNOSIS — J449 Chronic obstructive pulmonary disease, unspecified: Secondary | ICD-10-CM | POA: Diagnosis not present

## 2021-06-14 NOTE — Telephone Encounter (Signed)
Received a phone call from pt husband. He had left VM.  ?I called pt back. LVM for them to call back. ?

## 2021-06-15 NOTE — Telephone Encounter (Signed)
Patient called with her blood pressures. States an Medical illustrator came to her house and was concerned about her HR ? ?98/60 HR 56 ?116/36 HR 59 ?129/55 HR 56 ?106/54 HR 63 ?105/53 HR 56 ?116/53 HR 51 ?130/62 HR 51 ? ?She is having some dizzy/lightheadedness. ?I asked her to decrease her metoprolol to '50mg'$  daily. I asked her to call us next week to let us know how her BP and HR are doing with the change. ?

## 2021-06-20 ENCOUNTER — Other Ambulatory Visit: Payer: Self-pay | Admitting: Cardiovascular Disease

## 2021-06-21 NOTE — Progress Notes (Signed)
Patient ID: HONEST SAFRANEK                 DOB: 1945-08-10                      MRN: [id number] ? ? ? ? ?HPI: ?Melinda Gomez is a 76 y.o. female referred by Dr. Audie Box to pharmacy clinic for HF medication management. PMH is significant for non-ischemic cardiomyopathy, systolic HF, LBBB, HLD, COPD. Most recent LVEF 25-30% on 02/2021. At last visit with Dr Audie Box on 2/16, BP was elevated at 152/61. Her carvedilol was increased to 6.'25mg'$  BID, Entresto was increased to 49-'51mg'$  BID and spironolactone 12.'5mg'$  daily was added. I saw her on 3/7 and changed her carvedilol to Toprol for less effect on BP since she was reporting some dizziness. Sx continued after med change, BP was 98/60-130/62, HR was 51-63, and Toprol was decreased from '100mg'$  to '50mg'$  daily. ? ?Today she returns to pharmacy clinic with her husband for further medication titration. Has been feeling sluggish and having balance issues. Realized she has continued on carvedilol as well as metoprolol since last visit. Were looking at printed med list from last visit which still had carvedilol listed since med change was made the next day via phone call. Reports a bit more swelling in her ankles over the past few days too. Has had a few migraines since last visit, hard to get up some days.  ? ?BP at home when pt was still taking carvedilol and Toprol '100mg'$ : 98/60, 116/56, 129/55, 106/54, 105/53, 116/53, HR 51-63. ? ?BP at home once pt called in and Toprol was decreased to '50mg'$  but was still accidentally taking carvedilol: 130/62, 115/58, 109/54, HR 51-56. ? ?Current CHF meds: Entresto 49-'51mg'$  BID, Toprol '50mg'$  daily, spironolactone 12.'5mg'$  daily, Farxgia '10mg'$  daily, furosemide '40mg'$  daily ?BP goal: <130/35mHg ? ?Family History: The patient's family history includes Stroke in her mother ? ?Social History: Former smoker 1/2 PPD for 55 years, quit 02/2021. Denies alcohol and drug use. ? ?Diet: Eating more fruits and vegetables. Potassium-based salt  substitute. ? ?Exercise: SOB with walking ? ?Wt Readings from Last 3 Encounters:  ?05/29/21 147 lb (66.7 kg)  ?05/10/21 146 lb 9.6 oz (66.5 kg)  ?05/01/21 142 lb (64.4 kg)  ? ?BP Readings from Last 3 Encounters:  ?05/29/21 (!) 120/58  ?05/10/21 (!) 152/61  ?05/01/21 (!) 133/54  ? ?Pulse Readings from Last 3 Encounters:  ?05/29/21 78  ?05/10/21 79  ?05/01/21 85  ? ? ?Renal function: ?CrCl cannot be calculated (Patient's most recent lab result is older than the maximum 21 days allowed.). ? ?Past Medical History:  ?Diagnosis Date  ? [place]   ? GAD (generalized anxiety disorder)   ? GERD (gastroesophageal reflux disease)   ? Hypercholesteremia   ? Hypertension   ? ? ?Current Outpatient Medications on File Prior to Visit  ?Medication Sig Dispense Refill  ? albuterol (VENTOLIN HFA) 108 (90 Base) MCG/ACT inhaler 2 puffs every 4 (four) hours as needed for shortness of breath.    ? ALPRAZolam (XANAX) 0.5 MG tablet TAKE ONE TABLET BY MOUTH 3 TIMES DAILY AS NEEDED FOR ANXIETY. 90 tablet 4  ? B Complex Vitamins (VITAMIN B COMPLEX) TABS Take 1 tablet by mouth daily.    ? Cholecalciferol (VITAMIN D3) 125 MCG (5000 UT) TABS Take 5,000 Units by mouth daily.    ? cholestyramine (QUESTRAN) 4 GM/DOSE powder Take by mouth See admin instructions. 13 grams added  to 2-6 oz of water as needed    ? Coenzyme Q10 150 MG CAPS Take 1 capsule by mouth daily.    ? cyclobenzaprine (FLEXERIL) 10 MG tablet Take 10 mg by mouth daily in the afternoon.    ? dapagliflozin propanediol (FARXIGA) 10 MG TABS tablet Take 1 tablet (10 mg total) by mouth daily before breakfast. 90 tablet 1  ? diphenhydrAMINE (BENADRYL) 25 mg capsule Take 25 mg by mouth every 6 (six) hours as needed for allergies.    ? ENTRESTO 49-51 MG TAKE (1) TABLET BY MOUTH TWICE DAILY FOR BLOOD PRESSURE. 180 tablet 2  ? Fluticasone-Umeclidin-Vilant (TRELEGY ELLIPTA) 100-62.5-25 MCG/ACT AEPB Inhale 1 puff into the lungs daily as needed (Shortness  of breath).    ? furosemide (LASIX) 40 MG tablet Take 40 mg by mouth daily.    ? Garlic (GARLIQUE PO) Take 1 tablet by mouth daily.    ? Ginseng 250 MG CAPS Take 1 capsule by mouth daily.    ? metoprolol succinate (TOPROL XL) 100 MG 24 hr tablet Take 0.5 tablets (50 mg total) by mouth daily. Take with or immediately following a meal. 30 tablet 5  ? MISC NATURAL PRODUCTS PO Take by mouth. Zinc '22mg'$  quercetin '800mg'$  - 2 tablets daily    ? OVER THE COUNTER MEDICATION Take 3 tablets by mouth daily. Balance of Nature Fruit and Vegetables    ? promethazine (PHENERGAN) 12.5 MG tablet Take 12.5 mg by mouth every 6 (six) hours as needed for nausea.    ? risperiDONE (RISPERDAL) 1 MG tablet TAKE 1 TABLET BY MOUTH AT BEDTIME. FOR MOOD 90 tablet 3  ? rosuvastatin (CRESTOR) 20 MG tablet Take 20 mg by mouth daily.    ? spironolactone (ALDACTONE) 25 MG tablet Take 0.5 tablets (12.5 mg total) by mouth daily. 90 tablet 3  ? traMADol (ULTRAM) 50 MG tablet Take 50 mg by mouth daily as needed (migraine).    ? traZODone (DESYREL) 100 MG tablet 1 to 2 tablets at night as needed for sleep (Patient taking differently: Take 300 mg by mouth at bedtime.) 180 tablet 2  ? venlafaxine XR (EFFEXOR-XR) 75 MG 24 hr capsule Take 3 capsules (225 mg total) by mouth daily. (Patient taking differently: Take 75-150 mg by mouth See admin instructions. Take 75 mg at lunch and 150 mg at bedtime) 270 capsule 1  ? ?No current facility-administered medications on file prior to visit.  ? ? ?Allergies  ?Allergen Reactions  ? Hydrochlorothiazide   ?  Other reaction(s): hyponatremia  ? Lisinopril   ?  Other reaction(s): cough  ? Norpramin [Desipramine] Other (See Comments)  ?  Jittery  ? Other Other (See Comments)  ?  Estratest  ?depression & palpitation  ? Talwin [Pentazocine] Other (See Comments)  ?  hallucinations  ? Verapamil   ?  Other reaction(s): cough  ? ? ? ?Assessment/Plan: ? ?1. CHF with EF 25-30% - BP well controlled at goal < 130/20mHg with room to  further optimize medications. Pt has felt poorly since last visit, she continued taking her carvedilol in addition to starting Toprol. She is aware to stop her carvedilol and take Toprol '100mg'$  daily. Will continue Entresto 49-'51mg'$  BID, spironolactone 12.'5mg'$  daily, Farxgia '10mg'$  daily, and furosemide '40mg'$  daily. She will continue monitoring and recording BP/HR at home. Will call pt in 2 weeks for symptom update. If feeling better and BP allows, will plan to titrate either Entresto or spironolactone for her CHF.  She sees Dr OAudie Boxin  office in 3 weeks. ? ?Teofila Bowery E. Haidyn Kilburg, PharmD, BCACP, CPP ?[facility] [place], [place] [address] ?Phone: [phone]; Fax: [phone] ?06/21/2021 10:00 AM ? ? ?

## 2021-06-22 ENCOUNTER — Ambulatory Visit (INDEPENDENT_AMBULATORY_CARE_PROVIDER_SITE_OTHER): Payer: PPO | Admitting: Pharmacist

## 2021-06-22 VITALS — BP 118/72 | HR 58 | Wt 148.0 lb

## 2021-06-22 DIAGNOSIS — I5022 Chronic systolic (congestive) heart failure: Secondary | ICD-10-CM

## 2021-06-22 NOTE — Patient Instructions (Signed)
Stop taking carvedilol ? ?Continue taking metoprolol - take a full tablet ('100mg'$ ) each day ? ?Continue taking your other medications ? ?Monitor your blood pressure and heart rate at home ? ?I'll call you in 2 weeks to see how you're feeling and how your readings are looking ?

## 2021-07-05 ENCOUNTER — Telehealth: Payer: Self-pay | Admitting: Pharmacist

## 2021-07-05 NOTE — Telephone Encounter (Signed)
Called pt to follow up with med tolerability - was previously taking 2 beta blockers by accident. Confirmed she stopped her carvedilol and just continued on Toprol. Reports no improvement in her symptoms. Still has a variety of complaints including  pain in her legs, trouble standing, "trouble around her heart," feeling sluggish, having balance issues, mild swelling in legs, and coughing. Home systolic BP readings have been normal, a few reported diastolic readings are very low in the 30s - 124/61, 119/56, 108/58, 122/32?, 126/55, 120/60, 117/38?, 116/54, 115/55. Asked her to continue monitoring before visit next week with MD. Will not make med changes before then. ?

## 2021-07-10 ENCOUNTER — Ambulatory Visit (INDEPENDENT_AMBULATORY_CARE_PROVIDER_SITE_OTHER): Payer: PPO | Admitting: Psychiatry

## 2021-07-10 ENCOUNTER — Encounter: Payer: Self-pay | Admitting: Psychiatry

## 2021-07-10 DIAGNOSIS — F331 Major depressive disorder, recurrent, moderate: Secondary | ICD-10-CM

## 2021-07-10 DIAGNOSIS — F4310 Post-traumatic stress disorder, unspecified: Secondary | ICD-10-CM

## 2021-07-10 DIAGNOSIS — F5105 Insomnia due to other mental disorder: Secondary | ICD-10-CM | POA: Diagnosis not present

## 2021-07-10 DIAGNOSIS — G4720 Circadian rhythm sleep disorder, unspecified type: Secondary | ICD-10-CM
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
## 2021-07-10 NOTE — Patient Instructions (Addendum)
Reduce trazodone as long as you can still sleep to between 1 and 2 of the tablets. ? ?Try to substitute Allegra or Zyrtec for the Benadryl for allergies ?

## 2021-07-11 NOTE — Progress Notes (Signed)
?Cardiology Office Note:   ?Date:  07/13/2021  ?NAME:  Melinda Gomez    ?MRN: [id number] ?DOB:  [birth year]  ? ?PCP:  Lavone Orn, MD  ?Cardiologist:  None  ?Electrophysiologist:  None  ? ?Referring MD: Lavone Orn, MD  ? ?Chief Complaint  ?Patient presents with  ? Follow-up  ?   ?  ? ?History of Present Illness:   ?Melinda Gomez is a 76 y.o. female with a hx of systolic heart failure, diabetes, hypertension who presents for follow-up.  She presents for follow-up.  Reports low energy.  Not exercising.  Depression is an issue.  Weights are stable.  No volume overload.  She has worked on reducing her salt consumption as well as frozen meals.  Seems to be doing well.  Blood pressure has been stable.  Pulse has been in the 50s.  We discussed reducing her metoprolol to see if this helps.  Overall she is doing well.  On appropriate heart failure medications including Farxiga, metoprolol succinate, Entresto, Aldactone.  Also takes Lasix daily.  Overall doing really well.  We discussed repeating her heart ultrasound to determine if she will need a pacemaker.  I do have concerns given her left bundle branch block with wide QRS. ? ?Problem List ?COPD/Tobacco abuse ?HTN ?HLD ?LBBB, QRS 168 ms ?Systolic HF (non-ischemic) ?-02/2021: EF 25-30% ?-MPI infarct 02/23/2021 ?-LHC 30% RCA (non-obstructive) ?6. HLD ?-T chol 125, HDL 52, LDL 43, TG 186 ?7. DM ?-A1c 6.7 ? ?Past Medical History: ?Past Medical History:  ?Diagnosis Date  ? COPD (chronic obstructive pulmonary disease) ([place])   ? GAD (generalized anxiety disorder)   ? GERD (gastroesophageal reflux disease)   ? Hypercholesteremia   ? Hypertension   ? ? ?Past Surgical History: ?Past Surgical History:  ?Procedure Laterality Date  ? ABDOMINAL HYSTERECTOMY    ? BLADDER SURGERY    ? RIGHT/LEFT HEART CATH AND CORONARY ANGIOGRAPHY N/A 05/01/2021  ? Procedure: RIGHT/LEFT HEART CATH AND CORONARY ANGIOGRAPHY;  Surgeon: Martinique, Peter M, MD;  Location: [facility];  Service:  Cardiovascular;  Laterality: N/A;  ? ? ?Current Medications: ?No outpatient medications have been marked as taking for the 07/13/21 encounter (Office Visit) with Geralynn Rile, MD.  ?  ? ?Allergies:    ?Hydrochlorothiazide, Lisinopril, Norpramin [desipramine], Other, Talwin [pentazocine], and Verapamil  ? ?Social History: ?Social History  ? ?Socioeconomic History  ? Marital status: Married  ?  Spouse name: Not on file  ? Number of children: 2  ? Years of education: Not on file  ? Highest education level: Not on file  ?Occupational History  ? Occupation: Retired - Chemical engineer  ?Tobacco Use  ? Smoking status: Former  ?  Packs/day: 0.50  ?  Years: 55.00  ?  Pack years: 27.50  ?  Types: Cigarettes  ?  Quit date: 02/2021  ?  Years since quitting: 0.3  ? Smokeless tobacco: Never  ?Substance and Sexual Activity  ? Alcohol use: Never  ? Drug use: Never  ? Sexual activity: Not on file  ?Other Topics Concern  ? Not on file  ?Social History Narrative  ? Not on file  ? ?Social Determinants of Health  ? ?Financial Resource Strain: Not on file  ?Food Insecurity: Not on file  ?Transportation Needs: Not on file  ?Physical Activity: Not on file  ?Stress: Not on file  ?Social Connections: Not on file  ?  ? ?Family History: ?The patient's family history includes Stroke in her  mother. ? ?ROS:   ?All other ROS reviewed and negative. Pertinent positives noted in the HPI.    ? ?EKGs/Labs/Other Studies Reviewed:   ?The following studies were personally reviewed by me today: ? ?LHC/RHC 05/01/2021 ?  Ost RCA lesion is 30% stenosed. ?  LV end diastolic pressure is normal. ?  ?Minor nonobstructive CAD ?Normal LV filling pressure 15 mm Hg ?Upper normal right heart pressures. PAP mean 23 mm Hg ?Normal cardiac output. Index 2.86 ? ?Recent Labs: ?02/23/2021: ALT 16; Magnesium 1.6 ?04/24/2021: BNP 52.7; Platelets 187 ?05/01/2021: Hemoglobin 12.2 ?05/29/2021: BUN 20; Creatinine, Ser 1.24; Potassium 4.7; Sodium 139  ? ?Recent Lipid  Panel ?No results found for: CHOL, TRIG, HDL, CHOLHDL, VLDL, LDLCALC, LDLDIRECT ? ?Physical Exam:   ?VS:  BP (!) 128/54   Pulse 80   Ht '5\' 2"'$  (1.575 m)   Wt 146 lb 9.6 oz (66.5 kg)   SpO2 94%   BMI 26.81 kg/m?    ?Wt Readings from Last 3 Encounters:  ?07/13/21 146 lb 9.6 oz (66.5 kg)  ?06/22/21 148 lb (67.1 kg)  ?05/29/21 147 lb (66.7 kg)  ?  ?General: Well nourished, well developed, in no acute distress ?Head: Atraumatic, normal size  ?Eyes: PEERLA, EOMI  ?Neck: Supple, no JVD ?Endocrine: No thryomegaly ?Cardiac: Normal S1, S2; RRR; no murmurs, rubs, or gallops ?Lungs: Clear to auscultation bilaterally, no wheezing, rhonchi or rales  ?Abd: Soft, nontender, no hepatomegaly  ?Ext: No edema, pulses 2+ ?Musculoskeletal: No deformities, BUE and BLE strength normal and equal ?Skin: Warm and dry, no rashes   ?Neuro: Alert and oriented to person, place, time, and situation, CNII-XII grossly intact, no focal deficits  ?Psych: Normal mood and affect  ? ?ASSESSMENT:   ?Melinda Gomez is a 76 y.o. female who presents for the following: ?1. Chronic systolic CHF (congestive heart failure) ([place])   ?2. LBBB (left bundle branch block)   ?3. Primary hypertension   ?4. Mixed hyperlipidemia   ? ? ?PLAN:   ?1. Chronic systolic CHF (congestive heart failure) ([place]) ?2. LBBB (left bundle branch block) ?3. Primary hypertension ?4. Mixed hyperlipidemia ?-Nonischemic cardiomyopathy.  Left heart cath with nonobstructive disease.  Etiology is likely left bundle branch block related with QRS 168 ms.  Suspect she has significant dyssynchrony to explain her cardiomyopathy.  Regardless we have given her a trial of medical therapy.  Reduce metoprolol succinate to 50 mg daily given bradycardia at home.  Continue Entresto 49-51 mg twice daily.  On Aldactone 12.5 mg daily as well as [place].  She is euvolemic and will continue to take Lasix 40 mg daily.  I will plan to see her back in 3 months and repeat an echocardiogram before that  appointment.  This will be a full 6 months of guideline directed medical therapy.  If ejection fraction has not improved I will have her evaluated by electrophysiology for pacemaker and ICD.  I suspect she will need BiV pacing to help improve her dyssynchrony.  For her nonobstructive CAD she will continue with Crestor 20 mg daily.  Her most recent LDL cholesterol is at goal. ? ? ?  ? ?Disposition: Return in about 3 months (around 10/12/2021). ? ?Medication Adjustments/Labs and Tests Ordered: ?Current medicines are reviewed at length with the patient today.  Concerns regarding medicines are outlined above.  ?Orders Placed This Encounter  ?Procedures  ? ECHOCARDIOGRAM COMPLETE  ? ?Meds ordered this encounter  ?Medications  ? metoprolol succinate (TOPROL-XL) 50 MG 24 hr tablet  ?  Sig: Take 1 tablet (50 mg total) by mouth daily. Take with or immediately following a meal.  ?  Dispense:  90 tablet  ?  Refill:  1  ?  Replacing carvedilol  ? ? ?Patient Instructions  ?Medication Instructions:  ?Decrease Metoprolol to 50 mg daily  ? ?*If you need a refill on your cardiac medications before your next appointment, please call your pharmacy* ? ?Testing/Procedures: ?Echocardiogram (3 months before appointment) - Your physician has requested that you have an echocardiogram. Echocardiography is a painless test that uses sound waves to create images of your heart. It provides your doctor with information about the size and shape of your heart and how well your heart?s chambers and valves are working. This procedure takes approximately one hour. There are no restrictions for this procedure. This will be performed at either our [facility] location - [address], [place] location [facility] 2nd floor. ? ? ? ?Follow-Up: ?At [facility], you and your health needs are our priority.  As part of our continuing mission to provide you with exceptional heart care, we have created designated Provider Care  Teams.  These Care Teams include your primary Cardiologist (physician) and Advanced Practice Providers (APPs -  Physician Assistants and Nurse Practitioners) who all work together to provide you with the care you need, when you need

## 2021-07-12 ENCOUNTER — Ambulatory Visit: Payer: PPO | Admitting: Cardiovascular Disease

## 2021-07-13 ENCOUNTER — Encounter: Payer: Self-pay | Admitting: Cardiovascular Disease

## 2021-07-13 ENCOUNTER — Ambulatory Visit (INDEPENDENT_AMBULATORY_CARE_PROVIDER_SITE_OTHER): Payer: PPO | Admitting: Cardiovascular Disease

## 2021-07-13 VITALS — BP 128/54 | HR 80 | Ht 62.0 in | Wt 146.6 lb

## 2021-07-13 DIAGNOSIS — I10 Essential (primary) hypertension: Secondary | ICD-10-CM

## 2021-07-13 DIAGNOSIS — I447 Left bundle-branch block, unspecified: Secondary | ICD-10-CM

## 2021-07-13 DIAGNOSIS — E782 Mixed hyperlipidemia: Secondary | ICD-10-CM | POA: Diagnosis not present

## 2021-07-13 DIAGNOSIS — I5022 Chronic systolic (congestive) heart failure: Secondary | ICD-10-CM

## 2021-07-13 MED ORDER — METOPROLOL SUCCINATE ER 50 MG PO TB24: 50.0000 mg | ORAL_TABLET | Freq: Every day | ORAL | 1 refills | Status: DC

## 2021-07-13 NOTE — Patient Instructions (Signed)
Medication Instructions:  ?Decrease Metoprolol to 50 mg daily  ? ?*If you need a refill on your cardiac medications before your next appointment, please call your pharmacy* ? ?Testing/Procedures: ?Echocardiogram (3 months before appointment) - Your physician has requested that you have an echocardiogram. Echocardiography is a painless test that uses sound waves to create images of your heart. It provides your doctor with information about the size and shape of your heart and how well your heart?s chambers and valves are working. This procedure takes approximately one hour. There are no restrictions for this procedure. This will be performed at either our [facility] location - [address] [place] location [facility] 2nd floor. ? ? ? ?Follow-Up: ?At [facility], you and your health needs are our priority.  As part of our continuing mission to provide you with exceptional heart care, we have created designated Provider Care Teams.  These Care Teams include your primary Cardiologist (physician) and Advanced Practice Providers (APPs -  Physician Assistants and Nurse Practitioners) who all work together to provide you with the care you need, when you need it. ? ?We recommend signing up for the patient portal called "MyChart".  Sign up information is provided on this After Visit Summary.  MyChart is used to connect with patients for Virtual Visits (Telemedicine).  Patients are able to view lab/test results, encounter notes, upcoming appointments, etc.  Non-urgent messages can be sent to your provider as well.   ?To learn more about what you can do with MyChart, go to [email or web address].   ? ?Your next appointment:   ?3 month(s) ? ?The format for your next appointment:   ?In Person ? ?Provider:   ?Eleonore Chiquito, MD  ? ?Important Information About [place] ? ? ? ? ? ? ?

## 2021-07-31 DIAGNOSIS — D692 Other nonthrombocytopenic purpura: Secondary | ICD-10-CM | POA: Diagnosis not present

## 2021-07-31 DIAGNOSIS — J449 Chronic obstructive pulmonary disease, unspecified: Secondary | ICD-10-CM | POA: Diagnosis not present

## 2021-07-31 DIAGNOSIS — I5022 Chronic systolic (congestive) heart failure: Secondary | ICD-10-CM | POA: Diagnosis not present

## 2021-07-31 DIAGNOSIS — Z6826 Body mass index (BMI) 26.0-26.9, adult: Secondary | ICD-10-CM | POA: Diagnosis not present

## 2021-07-31 DIAGNOSIS — Z515 Encounter for palliative care: Secondary | ICD-10-CM | POA: Diagnosis not present

## 2021-07-31 DIAGNOSIS — Z7951 Long term (current) use of inhaled steroids: Secondary | ICD-10-CM | POA: Diagnosis not present

## 2021-07-31 DIAGNOSIS — Z66 Do not resuscitate: Secondary | ICD-10-CM | POA: Diagnosis not present

## 2021-08-01 ENCOUNTER — Other Ambulatory Visit (HOSPITAL_COMMUNITY): Payer: PPO

## 2021-08-06 ENCOUNTER — Other Ambulatory Visit: Payer: Self-pay | Admitting: Psychiatry

## 2021-08-06 DIAGNOSIS — F4310 Post-traumatic stress disorder, unspecified: Secondary | ICD-10-CM

## 2021-08-15 DIAGNOSIS — I5020 Unspecified systolic (congestive) heart failure: Secondary | ICD-10-CM | POA: Diagnosis not present

## 2021-08-15 DIAGNOSIS — I10 Essential (primary) hypertension: Secondary | ICD-10-CM | POA: Diagnosis not present

## 2021-08-15 DIAGNOSIS — J449 Chronic obstructive pulmonary disease, unspecified: Secondary | ICD-10-CM | POA: Diagnosis not present

## 2021-08-15 DIAGNOSIS — F325 Major depressive disorder, single episode, in full remission: Secondary | ICD-10-CM | POA: Diagnosis not present

## 2021-08-15 DIAGNOSIS — K219 Gastro-esophageal reflux disease without esophagitis: Secondary | ICD-10-CM | POA: Diagnosis not present

## 2021-08-15 DIAGNOSIS — E782 Mixed hyperlipidemia: Secondary | ICD-10-CM | POA: Diagnosis not present

## 2021-08-24 DIAGNOSIS — J449 Chronic obstructive pulmonary disease, unspecified: Secondary | ICD-10-CM | POA: Diagnosis not present

## 2021-08-24 DIAGNOSIS — I10 Essential (primary) hypertension: Secondary | ICD-10-CM | POA: Diagnosis not present

## 2021-08-24 DIAGNOSIS — F325 Major depressive disorder, single episode, in full remission: Secondary | ICD-10-CM | POA: Diagnosis not present

## 2021-08-24 DIAGNOSIS — I5020 Unspecified systolic (congestive) heart failure: Secondary | ICD-10-CM | POA: Diagnosis not present

## 2021-08-24 DIAGNOSIS — G4700 Insomnia, unspecified: Secondary | ICD-10-CM | POA: Diagnosis not present

## 2021-08-24 DIAGNOSIS — K219 Gastro-esophageal reflux disease without esophagitis: Secondary | ICD-10-CM | POA: Diagnosis not present

## 2021-08-24 DIAGNOSIS — E782 Mixed hyperlipidemia: Secondary | ICD-10-CM | POA: Diagnosis not present

## 2021-08-25 DIAGNOSIS — R000 Tachycardia, unspecified: Secondary | ICD-10-CM | POA: Diagnosis not present

## 2021-08-28 DIAGNOSIS — I110 Hypertensive heart disease with heart failure: Secondary | ICD-10-CM | POA: Diagnosis not present

## 2021-08-28 DIAGNOSIS — Z6826 Body mass index (BMI) 26.0-26.9, adult: Secondary | ICD-10-CM | POA: Diagnosis not present

## 2021-08-28 DIAGNOSIS — I5022 Chronic systolic (congestive) heart failure: Secondary | ICD-10-CM | POA: Diagnosis not present

## 2021-08-28 DIAGNOSIS — Z8679 Personal history of other diseases of the circulatory system: Secondary | ICD-10-CM | POA: Diagnosis not present

## 2021-08-28 DIAGNOSIS — D692 Other nonthrombocytopenic purpura: Secondary | ICD-10-CM | POA: Diagnosis not present

## 2021-09-20 ENCOUNTER — Telehealth: Payer: Self-pay

## 2021-09-20 NOTE — Telephone Encounter (Signed)
Patient assistance provider portion for [facility] faxed to [employer] as requested

## 2021-09-27 ENCOUNTER — Telehealth: Payer: Self-pay

## 2021-09-27 DIAGNOSIS — Z515 Encounter for palliative care: Secondary | ICD-10-CM | POA: Diagnosis not present

## 2021-09-27 DIAGNOSIS — E1169 Type 2 diabetes mellitus with other specified complication: Secondary | ICD-10-CM | POA: Diagnosis not present

## 2021-09-27 DIAGNOSIS — Z66 Do not resuscitate: Secondary | ICD-10-CM | POA: Diagnosis not present

## 2021-09-27 DIAGNOSIS — D692 Other nonthrombocytopenic purpura: Secondary | ICD-10-CM | POA: Diagnosis not present

## 2021-09-27 DIAGNOSIS — Z6827 Body mass index (BMI) 27.0-27.9, adult: Secondary | ICD-10-CM | POA: Diagnosis not present

## 2021-09-27 DIAGNOSIS — I5022 Chronic systolic (congestive) heart failure: Secondary | ICD-10-CM | POA: Diagnosis not present

## 2021-09-27 DIAGNOSIS — I10 Essential (primary) hypertension: Secondary | ICD-10-CM | POA: Diagnosis not present

## 2021-09-27 DIAGNOSIS — E785 Hyperlipidemia, unspecified: Secondary | ICD-10-CM | POA: Diagnosis not present

## 2021-09-27 DIAGNOSIS — E1159 Type 2 diabetes mellitus with other circulatory complications: Secondary | ICD-10-CM | POA: Diagnosis not present

## 2021-09-27 DIAGNOSIS — J449 Chronic obstructive pulmonary disease, unspecified: Secondary | ICD-10-CM | POA: Diagnosis not present

## 2021-09-27 NOTE — Telephone Encounter (Signed)
Called patient, spoke with husband. Advised that we needed to update farxiga patient assistance forms.  Patient husband advised he could mail it from there address if we can get Dr.O'Neal to go ahead and sign the patient assistance. I will have MD sign, and will mail to patient to fill out and sign and they will send to patient assistance.   Husband verbalized understanding, thankful for call back.

## 2021-10-01 ENCOUNTER — Telehealth: Payer: Self-pay | Admitting: Cardiovascular Disease

## 2021-10-01 ENCOUNTER — Ambulatory Visit (HOSPITAL_COMMUNITY): Payer: PPO | Attending: Cardiology

## 2021-10-01 DIAGNOSIS — I5022 Chronic systolic (congestive) heart failure: Secondary | ICD-10-CM | POA: Diagnosis not present

## 2021-10-01 NOTE — Telephone Encounter (Signed)
Patient's husband calling to follow up on patient's assistance form. He states that Dr. Audie Box was to sign the patient assistance but [employer] states that they have not received it yet. Aware that Dr. Audie Box will be in tomorrow. Please advise.   See phone note from 09/27/21

## 2021-10-02 LAB — ECHOCARDIOGRAM COMPLETE
Area-P 1/2: 3.12 cm2
S' Lateral: 3.4 cm

## 2021-10-02 NOTE — Telephone Encounter (Signed)
I called patient, advised I mailed them out to her last week- they needed updated forms, and provider portion. Patient verbalized understanding. They will be on the look out for the forms.

## 2021-10-04 ENCOUNTER — Other Ambulatory Visit: Payer: Self-pay

## 2021-10-04 NOTE — Telephone Encounter (Signed)
  Pt's spouse is calling back, he said,  [employer] also need a prescription for entresto

## 2021-10-05 ENCOUNTER — Other Ambulatory Visit: Payer: Self-pay

## 2021-10-05 ENCOUNTER — Telehealth: Payer: Self-pay | Admitting: Cardiovascular Disease

## 2021-10-05 MED ORDER — ENTRESTO 49-51 MG PO TABS: 1.0000 | ORAL_TABLET | Freq: Two times a day (BID) | ORAL | 2 refills | Status: DC

## 2021-10-05 NOTE — Telephone Encounter (Signed)
Called patient, advised RX has to be signed. MD had left yesterday before signing. I will have him sign this afternoon and will fax to patient assistance.  Patient verbalized understanding.

## 2021-10-05 NOTE — Telephone Encounter (Signed)
Pt c/o medication issue:  1. Name of Medication:   ENTRESTO 49-51 MG    2. How are you currently taking this medication (dosage and times per day)? TAKE (1) TABLET BY MOUTH TWICE DAILY FOR BLOOD PRESSURE.  3. Are you having a reaction (difficulty breathing--STAT)? No  4. What is your medication issue? Spouse calling to f/u on prescription being sent over to [employer]. Spouse would like a callback regarding this matter. Please advise

## 2021-10-05 NOTE — Telephone Encounter (Signed)
Printed. Will fax over to [employer].  Thanks!

## 2021-10-08 DIAGNOSIS — L509 Urticaria, unspecified: Secondary | ICD-10-CM | POA: Diagnosis not present

## 2021-10-08 DIAGNOSIS — L299 Pruritus, unspecified: Secondary | ICD-10-CM | POA: Diagnosis not present

## 2021-10-09 NOTE — Progress Notes (Unsigned)
Cardiology Office Note:   Date:  10/10/2021  NAME:  Melinda Gomez    MRN: [id number] DOB:  1945/07/02   PCP:  Lavone Orn, MD  Cardiologist:  None  Electrophysiologist:  None   Referring MD: Lavone Orn, MD   Chief Complaint  Patient presents with   Follow-up        History of Present Illness:   Melinda Gomez is a 76 y.o. female with a hx of systolic heart failure, left bundle branch block, hypertension, diabetes who presents for follow-up.  Ejection fraction has improved to 40-45%.  Still with significant LV dyssynchrony. Weights are stable.  Denies chest pain or significant symptoms in office.  Still not that active.  Does get short of breath with activity.  She does have COPD.  Likely contributing.  Euvolemic on examination.  Overall doing well on medication.  She is working on her diabetes.  Cholesterol level looks acceptable.  We discussed the mainstay of treatment is medical therapy currently.  Problem List COPD/Tobacco abuse HTN HLD LBBB, QRS 518 ms Systolic HF (non-ischemic) -02/2021: EF 25-30% -09/2021: EF 40-45% -MPI infarct 02/23/2021 -LHC 30% RCA (non-obstructive) 6. HLD -T chol 125, HDL 52, LDL 43, TG 186 7. DM -A1c 6.7  Past Medical History: Past Medical History:  Diagnosis Date   CHF (congestive heart failure) (H[facility])    COPD (chronic obstructive pulmonary disease) ([facility])    Coronary artery disease    GAD (generalized anxiety disorder)    GERD (gastroesophageal reflux disease)    Hypercholesteremia    Hypertension     Past Surgical History: Past Surgical History:  Procedure Laterality Date   ABDOMINAL HYSTERECTOMY     BLADDER SURGERY     RIGHT/LEFT HEART CATH AND CORONARY ANGIOGRAPHY N/A 05/01/2021   Procedure: RIGHT/LEFT HEART CATH AND CORONARY ANGIOGRAPHY;  Surgeon: Martinique, Peter M, MD;  Location: [facility];  Service: Cardiovascular;  Laterality: N/A;    Current Medications: Current Meds  Medication Sig   albuterol (VENTOLIN HFA)  108 (90 Base) MCG/ACT inhaler 2 puffs every 4 (four) hours as needed for shortness of breath.   ALPRAZolam (XANAX) 0.5 MG tablet TAKE ONE TABLET BY MOUTH 3 TIMES DAILY AS NEEDED FOR ANXIETY.   B Complex Vitamins (VITAMIN B COMPLEX) TABS Take 1 tablet by mouth daily.   Cholecalciferol (VITAMIN D3) 125 MCG (5000 UT) TABS Take 5,000 Units by mouth daily.   cholestyramine (QUESTRAN) 4 GM/DOSE powder Take by mouth See admin instructions. 13 grams added to 2-6 oz of water as needed   Coenzyme Q10 150 MG CAPS Take 1 capsule by mouth daily.   cyclobenzaprine (FLEXERIL) 10 MG tablet Take 10 mg by mouth daily in the afternoon.   dapagliflozin propanediol (FARXIGA) 10 MG TABS tablet Take 1 tablet (10 mg total) by mouth daily before breakfast.   diphenhydrAMINE (BENADRYL) 25 mg capsule Take 25 mg by mouth every 6 (six) hours as needed for allergies.   Fluticasone-Umeclidin-Vilant (TRELEGY ELLIPTA) 100-62.5-25 MCG/ACT AEPB Inhale 1 puff into the lungs daily as needed (Shortness of breath).   furosemide (LASIX) 40 MG tablet Take 40 mg by mouth daily.   Garlic (GARLIQUE PO) Take 1 tablet by mouth daily.   Ginseng 250 MG CAPS Take 1 capsule by mouth daily.   metoprolol succinate (TOPROL-XL) 50 MG 24 hr tablet Take 1 tablet (50 mg total) by mouth daily. Take with or immediately following a meal.   MISC NATURAL PRODUCTS PO Take by mouth. Zinc '22mg'$  quercetin  $'800mg'Z$  - 2 tablets daily   OVER THE COUNTER MEDICATION Take 3 tablets by mouth daily. Balance of Nature Fruit and Vegetables   promethazine (PHENERGAN) 12.5 MG tablet Take 12.5 mg by mouth every 6 (six) hours as needed for nausea.   risperiDONE (RISPERDAL) 1 MG tablet TAKE 1 TABLET BY MOUTH AT BEDTIME. FOR MOOD   rosuvastatin (CRESTOR) 20 MG tablet Take 20 mg by mouth daily.   sacubitril-valsartan (ENTRESTO) 49-51 MG Take 1 tablet by mouth 2 (two) times daily.   traMADol (ULTRAM) 50 MG tablet Take 50 mg by mouth daily as needed (migraine).   traZODone (DESYREL)  100 MG tablet 1 to 2 tablets at night as needed for sleep (Patient taking differently: Take 300 mg by mouth at bedtime.)   venlafaxine XR (EFFEXOR-XR) 75 MG 24 hr capsule Take 3 capsules (225 mg total) by mouth daily. (Patient taking differently: Take 75-150 mg by mouth See admin instructions. Take 75 mg at lunch and 150 mg at bedtime)     Allergies:    Hydrochlorothiazide, Lisinopril, Norpramin [desipramine], Other, Talwin [pentazocine], and Verapamil   Social History: Social History   Socioeconomic History   Marital status: Married    Spouse name: Not on file   Number of children: 2   Years of education: Not on file   Highest education level: Not on file  Occupational History   Occupation: Retired - Chemical engineer  Tobacco Use   Smoking status: Former    Packs/day: 0.50    Years: 55.00    Total pack years: 27.50    Types: Cigarettes    Quit date: 02/2021    Years since quitting: 0.6   Smokeless tobacco: Never  Substance and Sexual Activity   Alcohol use: Never   Drug use: Never   Sexual activity: Not on file  Other Topics Concern   Not on file  Social History Narrative   Not on file   Social Determinants of Health   Financial Resource Strain: Not on file  Food Insecurity: Not on file  Transportation Needs: Not on file  Physical Activity: Not on file  Stress: Not on file  Social Connections: Not on file     Family History: The patient's family history includes Stroke in her mother.  ROS:   All other ROS reviewed and negative. Pertinent positives noted in the HPI.     EKGs/Labs/Other Studies Reviewed:   The following studies were personally reviewed by me today:  TTE 10/02/2021  1. Left ventricular ejection fraction, by estimation, is 40 to 45%. The  left ventricle has mildly decreased function. The left ventricle  demonstrates regional wall motion abnormalities (see scoring  diagram/findings for description). Left ventricular  diastolic parameters  are indeterminate. There is severe hypokinesis of the  left ventricular, entire septal wall.   2. Right ventricular systolic function is normal. The right ventricular  size is normal.   3. The mitral valve was not well visualized. Trivial mitral valve  regurgitation. No evidence of mitral stenosis.   4. The aortic valve was not well visualized. Aortic valve regurgitation  is not visualized. No aortic stenosis is present.   5. The inferior vena cava is normal in size with greater than 50%  respiratory variability, suggesting right atrial pressure of 3 mmHg.   Recent Labs: 02/23/2021: ALT 16; Magnesium 1.6 04/24/2021: BNP 52.7; Platelets 187 05/01/2021: Hemoglobin 12.2 05/29/2021: BUN 20; Creatinine, Ser 1.24; Potassium 4.7; Sodium 139   Recent Lipid Panel No results  found for: "CHOL", "TRIG", "HDL", "CHOLHDL", "VLDL", "LDLCALC", "LDLDIRECT"  Physical Exam:   VS:  BP (!) 141/71   Pulse 70   Ht '5\' 3"'$  (1.6 m)   Wt 151 lb 12.8 oz (68.9 kg)   SpO2 94%   BMI 26.89 kg/m    Wt Readings from Last 3 Encounters:  10/10/21 151 lb 12.8 oz (68.9 kg)  07/13/21 146 lb 9.6 oz (66.5 kg)  06/22/21 148 lb (67.1 kg)    General: Well nourished, well developed, in no acute distress Head: Atraumatic, normal size  Eyes: PEERLA, EOMI  Neck: Supple, no JVD Endocrine: No thryomegaly Cardiac: Normal S1, S2; RRR; no murmurs, rubs, or gallops Lungs: Clear to auscultation bilaterally, no wheezing, rhonchi or rales  Abd: Soft, nontender, no hepatomegaly  Ext: No edema, pulses 2+ Musculoskeletal: No deformities, BUE and BLE strength normal and equal Skin: Warm and dry, no rashes   Neuro: Alert and oriented to person, place, time, and situation, CNII-XII grossly intact, no focal deficits  Psych: Normal mood and affect   ASSESSMENT:   Melinda Gomez is a 76 y.o. female who presents for the following: 1. Chronic systolic CHF (congestive heart failure) ([place])   2. LBBB (left bundle branch block)   3. Primary  hypertension   4. Mixed hyperlipidemia     PLAN:   1. Chronic systolic CHF (congestive heart failure) ([place]) 2. LBBB (left bundle branch block) -Chronic systolic heart failure related to left bundle branch block.  She has significant dyssynchrony.  QRS 168 ms.  No significant CAD at heart catheterization.  Her heart function has improved from 25 to 30% to 40-45%.  I still feel her cardiomyopathy is related to her left bundle branch block.  Regardless EF has improved.  I will discuss her case with EP.  I suspect they would just recommend to continue medical therapy.  Continue metoprolol, Entresto, Aldactone and SGLT2 inhibitor.  BP acceptable.  She is watching her salt intake.  She needs to start exercising more I think this would help.  Her family reports her overall symptoms have improved.  She needs to keep going. -Continue metoprolol succinate 50 mg daily, Entresto 49-51 mg twice daily, Aldactone 12.5 mg daily, Farxiga 10 mg daily.  She takes Lasix 40 mg daily.  Weights are stable.  No change in medical therapy.  See me back in 6 months.  3. Primary hypertension -Acceptable.  4. Mixed hyperlipidemia -Diabetic.  On statin.  LDL at goal.      Disposition: Return in about 6 months (around 04/12/2022).  Medication Adjustments/Labs and Tests Ordered: Current medicines are reviewed at length with the patient today.  Concerns regarding medicines are outlined above.  Orders Placed This Encounter  Procedures   Basic metabolic panel   No orders of the defined types were placed in this encounter.   Patient Instructions  Medication Instructions:  The current medical regimen is effective;  continue present plan and medications.  *If you need a refill on your cardiac medications before your next appointment, please call your pharmacy*   Lab Work: BMET today   If you have labs (blood work) drawn today and your tests are completely normal, you will receive your results only by: [place] (if you have MyChart) OR A paper copy in the mail If you have any lab test that is abnormal or we need to change your treatment, we will call you to review the results.   Follow-Up: At [facility], you and  your health needs are our priority.  As part of our continuing mission to provide you with exceptional heart care, we have created designated Provider Care Teams.  These Care Teams include your primary Cardiologist (physician) and Advanced Practice Providers (APPs -  Physician Assistants and Nurse Practitioners) who all work together to provide you with the care you need, when you need it.  We recommend signing up for the patient portal called "MyChart".  Sign up information is provided on this After Visit Summary.  MyChart is used to connect with patients for Virtual Visits (Telemedicine).  Patients are able to view lab/test results, encounter notes, upcoming appointments, etc.  Non-urgent messages can be sent to your provider as well.   To learn more about what you can do with MyChart, go to [email or web address].    Your next appointment:   6 month(s)  The format for your next appointment:   In Person  Provider:   Eleonore Chiquito, MD            Time Spent with Patient: I have spent a total of 25 minutes with patient reviewing hospital notes, telemetry, EKGs, labs and examining the patient as well as establishing an assessment and plan that was discussed with the patient.  > 50% of time was spent in direct patient care.  Signed, Addison Naegeli. Audie Box, MD, [place]  [address] [place], [place] [address] [phone]  10/10/2021 5:17 PM

## 2021-10-10 ENCOUNTER — Ambulatory Visit (INDEPENDENT_AMBULATORY_CARE_PROVIDER_SITE_OTHER): Payer: PPO | Admitting: Cardiovascular Disease

## 2021-10-10 ENCOUNTER — Encounter: Payer: Self-pay | Admitting: Cardiovascular Disease

## 2021-10-10 VITALS — BP 141/71 | HR 70 | Ht 63.0 in | Wt 151.8 lb

## 2021-10-10 DIAGNOSIS — E782 Mixed hyperlipidemia: Secondary | ICD-10-CM

## 2021-10-10 DIAGNOSIS — I447 Left bundle-branch block, unspecified: Secondary | ICD-10-CM | POA: Diagnosis not present

## 2021-10-10 DIAGNOSIS — I10 Essential (primary) hypertension: Secondary | ICD-10-CM | POA: Diagnosis not present

## 2021-10-10 DIAGNOSIS — I5022 Chronic systolic (congestive) heart failure: Secondary | ICD-10-CM

## 2021-10-10 LAB — BASIC METABOLIC PANEL
BUN/Creatinine Ratio: 19 (ref 12–28)
BUN: 22 mg/dL (ref 8–27)
CO2: 24 mmol/L (ref 20–29)
Calcium: 9.1 mg/dL (ref 8.7–10.3)
Chloride: 95 mmol/L — ABNORMAL LOW (ref 96–106)
Creatinine, Ser: 1.14 mg/dL — ABNORMAL HIGH (ref 0.57–1.00)
Glucose: 95 mg/dL (ref 70–99)
Potassium: 4 mmol/L (ref 3.5–5.2)
Sodium: 136 mmol/L (ref 134–144)
eGFR: 50 mL/min/{1.73_m2} — ABNORMAL LOW (ref >59)

## 2021-10-10 NOTE — Patient Instructions (Signed)
Medication Instructions:  The current medical regimen is effective;  continue present plan and medications.  *If you need a refill on your cardiac medications before your next appointment, please call your pharmacy*   Lab Work: BMET today   If you have labs (blood work) drawn today and your tests are completely normal, you will receive your results only by: . MyChart Message (if you have MyChart) OR . A paper copy in the mail If you have any lab test that is abnormal or we need to change your treatment, we will call you to review the results.    Follow-Up: At [facility], you and your health needs are our priority.  As part of our continuing mission to provide you with exceptional heart care, we have created designated Provider Care Teams.  These Care Teams include your primary Cardiologist (physician) and Advanced Practice Providers (APPs -  Physician Assistants and Nurse Practitioners) who all work together to provide you with the care you need, when you need it.  We recommend signing up for the patient portal called "MyChart".  Sign up information is provided on this After Visit Summary.  MyChart is used to connect with patients for Virtual Visits (Telemedicine).  Patients are able to view lab/test results, encounter notes, upcoming appointments, etc.  Non-urgent messages can be sent to your provider as well.   To learn more about what you can do with MyChart, go to [email or web address].    Your next appointment:   6 month(s)  The format for your next appointment:   In Person  Provider:   Rossmoyne O'Neal, MD     

## 2021-10-16 ENCOUNTER — Other Ambulatory Visit: Payer: Self-pay | Admitting: Psychiatry

## 2021-10-16 DIAGNOSIS — F331 Major depressive disorder, recurrent, moderate: Secondary | ICD-10-CM

## 2021-10-16 DIAGNOSIS — F4310 Post-traumatic stress disorder, unspecified: Secondary | ICD-10-CM

## 2021-10-19 ENCOUNTER — Telehealth: Payer: Self-pay | Admitting: Cardiovascular Disease

## 2021-10-19 NOTE — Telephone Encounter (Signed)
Pt c/o medication issue:  1. Name of Medication:    2. How are you currently taking this medication (dosage and times per day)?    3. Are you having a reaction (difficulty breathing--STAT)? no  4. What is your medication issue? Patient broke out in rash unsure if its from any of the medication she take. Please advise

## 2021-10-19 NOTE — Telephone Encounter (Signed)
Spoke with pt, she reports she has a rash all over her body that started 3 weeks ago. She has been on all of her current medications for over 2 months now. She has not changed soap, or detergent. She reports it looks like measles and is itching. Aware not sure what is going on and to contact the medical doctor. She does get relief from benadryl.

## 2021-10-23 DIAGNOSIS — R21 Rash and other nonspecific skin eruption: Secondary | ICD-10-CM | POA: Diagnosis not present

## 2021-10-23 DIAGNOSIS — Z79899 Other long term (current) drug therapy: Secondary | ICD-10-CM | POA: Diagnosis not present

## 2021-10-25 ENCOUNTER — Other Ambulatory Visit: Payer: Self-pay

## 2021-10-25 DIAGNOSIS — L298 Other pruritus: Secondary | ICD-10-CM | POA: Diagnosis not present

## 2021-10-25 DIAGNOSIS — S30861A Insect bite (nonvenomous) of abdominal wall, initial encounter: Secondary | ICD-10-CM | POA: Diagnosis not present

## 2021-10-25 DIAGNOSIS — S50861A Insect bite (nonvenomous) of right forearm, initial encounter: Secondary | ICD-10-CM | POA: Diagnosis not present

## 2021-10-25 MED ORDER — ENTRESTO 49-51 MG PO TABS: 1.0000 | ORAL_TABLET | Freq: Two times a day (BID) | ORAL | 2 refills | Status: DC

## 2021-10-25 NOTE — Telephone Encounter (Signed)
I called over to patient assistance- they advised that they did not receive the prescription that was faxed to them on 07/17- she gave me the e-scribe option- I did send this over to them today.  It can take up to 2 business days to process- will continue to monitor.

## 2021-10-25 NOTE — Telephone Encounter (Signed)
  Pt's spouse calling back, per [employer] they couldn't accept the prescription faxed to them. He said, they only need a stand alone perception for entresto. He gave fax# [phone]

## 2021-11-05 DIAGNOSIS — I5022 Chronic systolic (congestive) heart failure: Secondary | ICD-10-CM | POA: Diagnosis not present

## 2021-11-05 DIAGNOSIS — K219 Gastro-esophageal reflux disease without esophagitis: Secondary | ICD-10-CM | POA: Diagnosis not present

## 2021-11-05 DIAGNOSIS — J449 Chronic obstructive pulmonary disease, unspecified: Secondary | ICD-10-CM | POA: Diagnosis not present

## 2021-11-05 DIAGNOSIS — I5020 Unspecified systolic (congestive) heart failure: Secondary | ICD-10-CM | POA: Diagnosis not present

## 2021-11-05 DIAGNOSIS — I10 Essential (primary) hypertension: Secondary | ICD-10-CM | POA: Diagnosis not present

## 2021-11-05 DIAGNOSIS — R21 Rash and other nonspecific skin eruption: Secondary | ICD-10-CM | POA: Diagnosis not present

## 2021-11-09 ENCOUNTER — Telehealth: Payer: Self-pay | Admitting: Cardiovascular Disease

## 2021-11-09 NOTE — Telephone Encounter (Signed)
Pt c/o medication issue:  1. Name of Medication: sacubitril-valsartan (ENTRESTO) 49-51 MG  2. How are you currently taking this medication (dosage and times per day)? 1 tablet twice a day  3. Are you having a reaction (difficulty breathing--STAT)? no  4. What is your medication issue? Patient's daughter states they received the approval for patient assistance from [employer]. She states [employer] has not yet received the prescription.

## 2021-11-10 ENCOUNTER — Other Ambulatory Visit: Payer: Self-pay | Admitting: Psychiatry

## 2021-11-10 DIAGNOSIS — G4720 Circadian rhythm sleep disorder, unspecified type: Secondary | ICD-10-CM

## 2021-11-10 DIAGNOSIS — F5105 Insomnia due to other mental disorder: Secondary | ICD-10-CM

## 2021-11-13 DIAGNOSIS — I5020 Unspecified systolic (congestive) heart failure: Secondary | ICD-10-CM | POA: Diagnosis not present

## 2021-11-13 NOTE — Telephone Encounter (Signed)
Sent over via epic fax to [employer]- will contact to follow up.

## 2021-11-16 NOTE — Telephone Encounter (Signed)
Called patients daughter, LVM that I had spoken with patient assistance and she was showing active- they just needed to contact to receive the first fill for medication.  Advised to call back with questions/concerns.

## 2021-11-20 DIAGNOSIS — R350 Frequency of micturition: Secondary | ICD-10-CM | POA: Diagnosis not present

## 2021-11-20 DIAGNOSIS — R410 Disorientation, unspecified: Secondary | ICD-10-CM | POA: Diagnosis not present

## 2021-11-20 DIAGNOSIS — R609 Edema, unspecified: Secondary | ICD-10-CM | POA: Diagnosis not present

## 2021-11-21 DIAGNOSIS — R410 Disorientation, unspecified: Secondary | ICD-10-CM | POA: Diagnosis not present

## 2021-11-26 ENCOUNTER — Other Ambulatory Visit: Payer: Self-pay | Admitting: Cardiovascular Disease

## 2021-11-26 MED ORDER — METOPROLOL SUCCINATE ER 50 MG PO TB24: ORAL_TABLET | ORAL | 3 refills | Status: DC

## 2021-11-29 ENCOUNTER — Encounter: Payer: Self-pay | Admitting: Cardiovascular Disease

## 2021-12-19 ENCOUNTER — Encounter: Payer: Self-pay | Admitting: Cardiovascular Disease

## 2021-12-21 ENCOUNTER — Encounter: Payer: Self-pay | Admitting: Physician Assistant

## 2021-12-21 ENCOUNTER — Ambulatory Visit: Payer: PPO | Attending: Physician Assistant | Admitting: Physician Assistant

## 2021-12-21 VITALS — BP 115/48 | HR 66 | Ht 63.0 in | Wt 150.6 lb

## 2021-12-21 DIAGNOSIS — I10 Essential (primary) hypertension: Secondary | ICD-10-CM

## 2021-12-21 DIAGNOSIS — I4719 Other supraventricular tachycardia: Secondary | ICD-10-CM

## 2021-12-21 DIAGNOSIS — I471 Supraventricular tachycardia: Secondary | ICD-10-CM | POA: Diagnosis not present

## 2021-12-21 DIAGNOSIS — E119 Type 2 diabetes mellitus without complications: Secondary | ICD-10-CM

## 2021-12-21 DIAGNOSIS — Z79899 Other long term (current) drug therapy: Secondary | ICD-10-CM

## 2021-12-21 DIAGNOSIS — E785 Hyperlipidemia, unspecified: Secondary | ICD-10-CM

## 2021-12-21 DIAGNOSIS — I447 Left bundle-branch block, unspecified: Secondary | ICD-10-CM | POA: Diagnosis not present

## 2021-12-21 DIAGNOSIS — I5022 Chronic systolic (congestive) heart failure: Secondary | ICD-10-CM

## 2021-12-21 NOTE — Patient Instructions (Addendum)
Medication Instructions:  Your physician recommends that you continue on your current medications as directed. Please refer to the Current Medication list given to you today.   *If you need a refill on your cardiac medications before your next appointment, please call your pharmacy*   Lab Work: Your physician recommends that you complete lab work today BMET  If you have labs (blood work) drawn today and your tests are completely normal, you will receive your results only by: MyChart Message (if you have MyChart) OR A paper copy in the mail If you have any lab test that is abnormal or we need to change your treatment, we will call you to review the results.   Testing/Procedures: NONE ordered at this time of appointment     Follow-Up: At [facility], you and your health needs are our priority.  As part of our continuing mission to provide you with exceptional heart care, we have created designated Provider Care Teams.  These Care Teams include your primary Cardiologist (physician) and Advanced Practice Providers (APPs -  Physician Assistants and Nurse Practitioners) who all work together to provide you with the care you need, when you need it.  We recommend signing up for the patient portal called "MyChart".  Sign up information is provided on this After Visit Summary.  MyChart is used to connect with patients for Virtual Visits (Telemedicine).  Patients are able to view lab/test results, encounter notes, upcoming appointments, etc.  Non-urgent messages can be sent to your provider as well.   To learn more about what you can do with MyChart, go to [email or web address].    Your next appointment:   Keep follow up   The format for your next appointment:   In Person  Provider:   Evalina Field, MD     Other Instructions   Important Information About Sugar

## 2021-12-21 NOTE — Progress Notes (Unsigned)
Cardiology Office Note:    Date:  12/22/2021   ID:  Melinda Gomez, DOB [birth year], MRN [id number]  PCP:  Lavone Orn, [place] [facility] Cardiologist:  Evalina Field, MD     Referring MD: Lavone Orn, MD   Chief Complaint  Patient presents with   Follow-up    Seen for Dr. Audie Box    History of Present Illness:    Melinda Gomez is a 76 y.o. female with a hx of chronic systolic heart failure, LBBB, hypertension, hyperlipidemia DM2 and history of COPD.  Patient was initially noted to have low EF of 25 to 30% on echocardiogram in December 2022.  Cardiac MRI in December 2022 suggested possible infarct.  Subsequent cardiac catheterization revealed only 30% RCA lesion, otherwise nonobstructive disease.  By July 2023, EF has improved to 40 to 45% with severe hypokinesis of the LV and the entire septal wall, trivial MR.  It was felt that her cardiomyopathy might be related to left bundle branch block.  Dr. Audie Box was going to discuss with the EP service, however suspected EP likely will recommend continue medical therapy for now given improvement in the ejection fraction.  She is on metoprolol, Entresto, spironolactone and SGLT2 inhibitor.  She is on 40 mg daily of Lasix.  Patient was last seen by Dr. Audie Box in July 2023 at which time she was doing well.  Patient presents today for cardiology follow-up and evaluation of ankle edema and palpitation.  He has been noticing some ankle edema for the past month.  On physical exam, She only has trace ankle edema and no lower leg edema.  Her lung is clear.  I actually think she is nearing euvolemic level.  Her weight has not changed significantly recently.  Her usual weight has been around 147 pounds based on home scale.  Family is aware that if she has worsening edema or you have her weight increased by more than 3 pounds overnight or 5 pounds in a single week, they have the option of giving additional tablet of as needed Lasix.  I  do not recommend additional Lasix today as she appears to be euvolemic.  As for palpitation, patient has been noticing increased heart rate when she tried to get up and walk.  However, she says her palpitation is only associated with body position changes and then does not occur suddenly.  I do not think she has irregular rhythm.  Based on family members vital check, when she does have this palpitation, her heart rate only go up to the low 90s.  I suspect the reason her heart rate increases is more because of deconditioning and when she tried to get up and walk, her heart compensate by beating slightly faster.  On physical exam, I do not think the patient is significantly dehydrated either, however I will obtain a basic metabolic panel to make sure her renal function the electrolyte is stable.  Past Medical History:  Diagnosis Date   CHF (congestive heart failure) (HCC)    COPD (chronic obstructive pulmonary disease) (HCC)    Coronary artery disease    GAD (generalized anxiety disorder)    GERD (gastroesophageal reflux disease)    Hypercholesteremia    Hypertension     Past Surgical History:  Procedure Laterality Date   ABDOMINAL HYSTERECTOMY     BLADDER SURGERY     RIGHT/LEFT HEART CATH AND CORONARY ANGIOGRAPHY N/A 05/01/2021   Procedure: RIGHT/LEFT HEART CATH AND CORONARY ANGIOGRAPHY;  Surgeon: Martinique, Peter M, MD;  Location: [facility];  Service: Cardiovascular;  Laterality: N/A;    Current Medications: Current Meds  Medication Sig   albuterol (VENTOLIN HFA) 108 (90 Base) MCG/ACT inhaler 2 puffs every 4 (four) hours as needed for shortness of breath.   ALPRAZolam (XANAX) 0.5 MG tablet TAKE ONE TABLET BY MOUTH 3 TIMES DAILY AS NEEDED FOR ANXIETY.   B Complex Vitamins (VITAMIN B COMPLEX) TABS Take 1 tablet by mouth daily.   Cholecalciferol (VITAMIN D3) 125 MCG (5000 UT) TABS Take 5,000 Units by mouth daily.   cholestyramine (QUESTRAN) 4 GM/DOSE powder Take by mouth See admin  instructions. 13 grams added to 2-6 oz of water as needed   Coenzyme Q10 150 MG CAPS Take 1 capsule by mouth daily.   cyclobenzaprine (FLEXERIL) 10 MG tablet Take 10 mg by mouth daily in the afternoon.   dapagliflozin propanediol (FARXIGA) 10 MG TABS tablet Take 1 tablet (10 mg total) by mouth daily before breakfast.   diphenhydrAMINE (BENADRYL) 25 mg capsule Take 25 mg by mouth every 6 (six) hours as needed for allergies.   Fluticasone-Umeclidin-Vilant (TRELEGY ELLIPTA) 100-62.5-25 MCG/ACT AEPB Inhale 1 puff into the lungs daily as needed (Shortness of breath).   furosemide (LASIX) 40 MG tablet Take 40 mg by mouth daily.   Garlic (GARLIQUE PO) Take 1 tablet by mouth daily.   Ginseng 250 MG CAPS Take 1 capsule by mouth daily.   metoprolol succinate (TOPROL-XL) 50 MG 24 hr tablet Take 1 tablet (50 mg total) by mouth daily for heart and blood pressure. Take with or immediately following a meal.   MISC NATURAL PRODUCTS PO Take by mouth. Zinc '22mg'$  quercetin '800mg'$  - 2 tablets daily   OVER THE COUNTER MEDICATION Take 3 tablets by mouth daily. Balance of Nature Fruit and Vegetables   promethazine (PHENERGAN) 12.5 MG tablet Take 12.5 mg by mouth every 6 (six) hours as needed for nausea.   risperiDONE (RISPERDAL) 1 MG tablet TAKE 1 TABLET BY MOUTH AT BEDTIME. FOR MOOD   rosuvastatin (CRESTOR) 20 MG tablet Take 20 mg by mouth daily.   sacubitril-valsartan (ENTRESTO) 49-51 MG Take 1 tablet by mouth 2 (two) times daily.   traMADol (ULTRAM) 50 MG tablet Take 50 mg by mouth daily as needed (migraine).   traZODone (DESYREL) 100 MG tablet TAKE 1 TO2 TABLETS AT BEDTIME AS NEEDED FOR SLEEP. USE THE LOWEST DOSE THAT WORKS.   venlafaxine XR (EFFEXOR-XR) 75 MG 24 hr capsule TAKE 1 CAPSULE DURING THE DAY AND 2 CAPSULES AT BEDTIME FOR DEPRESSION.     Allergies:   Hydrochlorothiazide, Lisinopril, Norpramin [desipramine], Other, Talwin [pentazocine], and Verapamil   Social History   Socioeconomic History   Marital  status: Married    Spouse name: Not on file   Number of children: 2   Years of education: Not on file   Highest education level: Not on file  Occupational History   Occupation: Retired - Chemical engineer  Tobacco Use   Smoking status: Former    Packs/day: 0.50    Years: 55.00    Total pack years: 27.50    Types: Cigarettes    Quit date: 02/2021    Years since quitting: 0.8   Smokeless tobacco: Never  Substance and Sexual Activity   Alcohol use: Never   Drug use: Never   Sexual activity: Not on file  Other Topics Concern   Not on file  Social History Narrative   Not on file  Social Determinants of Health   Financial Resource Strain: Not on file  Food Insecurity: Not on file  Transportation Needs: Not on file  Physical Activity: Not on file  Stress: Not on file  Social Connections: Not on file     Family History: The patient's family history includes Stroke in her mother.  ROS:   Please see the history of present illness.     All other systems reviewed and are negative.  EKGs/Labs/Other Studies Reviewed:    The following studies were reviewed today:  Echo 10/01/2021 1. Left ventricular ejection fraction, by estimation, is 40 to 45%. The  left ventricle has mildly decreased function. The left ventricle  demonstrates regional wall motion abnormalities (see scoring  diagram/findings for description). Left ventricular  diastolic parameters are indeterminate. There is severe hypokinesis of the  left ventricular, entire septal wall.   2. Right ventricular systolic function is normal. The right ventricular  size is normal.   3. The mitral valve was not well visualized. Trivial mitral valve  regurgitation. No evidence of mitral stenosis.   4. The aortic valve was not well visualized. Aortic valve regurgitation  is not visualized. No aortic stenosis is present.   5. The inferior vena cava is normal in size with greater than 50%  respiratory variability,  suggesting right atrial pressure of 3 mmHg.   Comparison(s): No significant change from prior study. EF improved compared to prior study.   Conclusion(s)/Recommendation(s): EF improved from prior; septal wall motion abnormality consistent with LBBB.    EKG:  EKG is ordered today.  The ekg ordered today demonstrates normal sinus rhythm with a left bundle branch block  Recent Labs: 02/23/2021: ALT 16; Magnesium 1.6 04/24/2021: BNP 52.7; Platelets 187 05/01/2021: Hemoglobin 12.2 12/21/2021: BUN 15; Creatinine, Ser 1.09; Potassium 4.5; Sodium 139  Recent Lipid Panel No results found for: "CHOL", "TRIG", "HDL", "CHOLHDL", "VLDL", "LDLCALC", "LDLDIRECT"   Risk Assessment/Calculations:           Physical Exam:    VS:  BP (!) 115/48   Pulse 66   Ht '5\' 3"'$  (1.6 m)   Wt 150 lb 9.6 oz (68.3 kg)   SpO2 96%   BMI 26.68 kg/m         Wt Readings from Last 3 Encounters:  12/21/21 150 lb 9.6 oz (68.3 kg)  10/10/21 151 lb 12.8 oz (68.9 kg)  07/13/21 146 lb 9.6 oz (66.5 kg)     GEN:  Well nourished, well developed in no acute distress HEENT: Normal NECK: No JVD; No carotid bruits LYMPHATICS: No lymphadenopathy CARDIAC: RRR, no murmurs, rubs, gallops RESPIRATORY:  Clear to auscultation without rales, wheezing or rhonchi  ABDOMEN: Soft, non-tender, non-distended MUSCULOSKELETAL:  No edema; No deformity  SKIN: Warm and dry NEUROLOGIC:  Alert and oriented x 3 PSYCHIATRIC:  Normal affect   ASSESSMENT:    1. Chronic systolic CHF (congestive heart failure) ([place])   2. Medication management   3. Primary hypertension   4. Hyperlipidemia LDL goal <70   5. Controlled type 2 diabetes mellitus without complication, without long-term current use of insulin ([facility])   6. LBBB (left bundle branch block)   7. Atrial tachycardia    PLAN:    In order of problems listed above:  Chronic systolic heart failure: Patient appears to be euvolemic on physical exam.  Continue Entresto and metoprolol  succinate.  Also on Farxiga and spironolactone.  Hypertension: Blood pressure stable  Hyperlipidemia: On Crestor  DM2: Managed by primary care provider  LBBB: Chronic  Atrial tachycardia: Patient is describing increased heart rate when she tried to get up and walk.  She is extremely sedentary, symptom only occurs when she tries to exert herself and resolves when she rest.  It does not sound to me she actually went out of rhythm, instead I suspect her heart rate picked up when she tried to exert herself due to underlying deconditioning.  EKG today showed normal sinus rhythm, will hold off on heart monitor at this time.           Medication Adjustments/Labs and Tests Ordered: Current medicines are reviewed at length with the patient today.  Concerns regarding medicines are outlined above.  Orders Placed This Encounter  Procedures   Basic metabolic panel   EKG [id number]   No orders of the defined types were placed in this encounter.   Patient Instructions  Medication Instructions:  Your physician recommends that you continue on your current medications as directed. Please refer to the Current Medication list given to you today.   *If you need a refill on your cardiac medications before your next appointment, please call your pharmacy*   Lab Work: Your physician recommends that you complete lab work today BMET  If you have labs (blood work) drawn today and your tests are completely normal, you will receive your results only by: MyChart Message (if you have MyChart) OR A paper copy in the mail If you have any lab test that is abnormal or we need to change your treatment, we will call you to review the results.   Testing/Procedures: NONE ordered at this time of appointment     Follow-Up: At [facility], you and your health needs are our priority.  As part of our continuing mission to provide you with exceptional heart care, we have created designated Provider Care  Teams.  These Care Teams include your primary Cardiologist (physician) and Advanced Practice Providers (APPs -  Physician Assistants and Nurse Practitioners) who all work together to provide you with the care you need, when you need it.  We recommend signing up for the patient portal called "MyChart".  Sign up information is provided on this After Visit Summary.  MyChart is used to connect with patients for Virtual Visits (Telemedicine).  Patients are able to view lab/test results, encounter notes, upcoming appointments, etc.  Non-urgent messages can be sent to your provider as well.   To learn more about what you can do with MyChart, go to [email or web address].    Your next appointment:   Keep follow up   The format for your next appointment:   In Person  Provider:   Evalina Field, MD     Other Instructions   Important Information About Sugar         Hilbert Corrigan, [place]  12/22/2021 11:24 PM    [place]

## 2021-12-22 ENCOUNTER — Encounter: Payer: Self-pay | Admitting: Physician Assistant

## 2021-12-22 LAB — BASIC METABOLIC PANEL
BUN/Creatinine Ratio: 14 (ref 12–28)
BUN: 15 mg/dL (ref 8–27)
CO2: 26 mmol/L (ref 20–29)
Calcium: 9.7 mg/dL (ref 8.7–10.3)
Chloride: 96 mmol/L (ref 96–106)
Creatinine, Ser: 1.09 mg/dL — ABNORMAL HIGH (ref 0.57–1.00)
Glucose: 96 mg/dL (ref 70–99)
Potassium: 4.5 mmol/L (ref 3.5–5.2)
Sodium: 139 mmol/L (ref 134–144)
eGFR: 53 mL/min/{1.73_m2} — ABNORMAL LOW (ref >59)

## 2022-01-02 ENCOUNTER — Encounter: Payer: Self-pay | Admitting: Cardiovascular Disease

## 2022-01-04 ENCOUNTER — Other Ambulatory Visit: Payer: Self-pay | Admitting: Psychiatry

## 2022-01-04 DIAGNOSIS — F331 Major depressive disorder, recurrent, moderate: Secondary | ICD-10-CM

## 2022-01-04 DIAGNOSIS — F4310 Post-traumatic stress disorder, unspecified: Secondary | ICD-10-CM

## 2022-01-07 NOTE — Telephone Encounter (Signed)
Spoke to patient she stated swelling in both lower legs alittle better but she is having pain in both lower legs,left leg is worse.Appointment scheduled with Almyra Deforest PA 10/19 at 10:05 am.

## 2022-01-09 ENCOUNTER — Ambulatory Visit (INDEPENDENT_AMBULATORY_CARE_PROVIDER_SITE_OTHER): Payer: PPO | Admitting: Psychiatry

## 2022-01-09 ENCOUNTER — Encounter: Payer: Self-pay | Admitting: Psychiatry

## 2022-01-09 DIAGNOSIS — F5105 Insomnia due to other mental disorder: Secondary | ICD-10-CM

## 2022-01-09 DIAGNOSIS — G4720 Circadian rhythm sleep disorder, unspecified type: Secondary | ICD-10-CM | POA: Diagnosis not present

## 2022-01-09 DIAGNOSIS — F4310 Post-traumatic stress disorder, unspecified: Secondary | ICD-10-CM | POA: Diagnosis not present

## 2022-01-09 DIAGNOSIS — F22 Delusional disorders: Secondary | ICD-10-CM

## 2022-01-09 DIAGNOSIS — F331 Major depressive disorder, recurrent, moderate: Secondary | ICD-10-CM

## 2022-01-09 MED ORDER — RISPERIDONE 1 MG PO TABS: 1.0000 mg | ORAL_TABLET | Freq: Every day | ORAL | 5 refills | Status: DC

## 2022-01-09 MED ORDER — TRAZODONE HCL 100 MG PO TABS: ORAL_TABLET | ORAL | 0 refills | Status: DC

## 2022-01-09 MED ORDER — VENLAFAXINE HCL ER 75 MG PO CP24: ORAL_CAPSULE | ORAL | 5 refills | Status: DC

## 2022-01-09 MED ORDER — ALPRAZOLAM 0.5 MG PO TABS: ORAL_TABLET | ORAL | 5 refills | Status: DC
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
## 2022-01-10 ENCOUNTER — Encounter: Payer: Self-pay | Admitting: Physician Assistant

## 2022-01-10 ENCOUNTER — Ambulatory Visit: Payer: PPO | Attending: Physician Assistant | Admitting: Physician Assistant

## 2022-01-10 VITALS — BP 118/62 | HR 72 | Ht 62.0 in | Wt 149.2 lb

## 2022-01-10 DIAGNOSIS — I10 Essential (primary) hypertension: Secondary | ICD-10-CM

## 2022-01-10 DIAGNOSIS — E785 Hyperlipidemia, unspecified: Secondary | ICD-10-CM | POA: Diagnosis not present

## 2022-01-10 DIAGNOSIS — E119 Type 2 diabetes mellitus without complications: Secondary | ICD-10-CM | POA: Diagnosis not present

## 2022-01-10 DIAGNOSIS — I2510 Atherosclerotic heart disease of native coronary artery without angina pectoris: Secondary | ICD-10-CM

## 2022-01-10 DIAGNOSIS — I5022 Chronic systolic (congestive) heart failure: Secondary | ICD-10-CM | POA: Diagnosis not present

## 2022-01-10 NOTE — Progress Notes (Signed)
Cardiology Office Note:    Date:  01/12/2022   ID:  Melinda Gomez, DOB [birth year], MRN [id number]  PCP:  Lavone Orn, [place] Providers Cardiologist:  Evalina Field, MD     Referring MD: Lavone Orn, MD   Chief Complaint  Patient presents with   Follow-up    Seen for Dr. Audie Box    History of Present Illness:    Melinda Gomez is a 76 y.o. female with a hx of chronic systolic heart failure, LBBB, HTN, HLD, DM2 and history of COPD.  Patient was initially noted to have low EF of 25 to 30% on echocardiogram in December 2022.  Cardiac MRI in December 2022 suggested possible infarct.  Subsequent cardiac catheterization revealed only 30% RCA lesion, otherwise nonobstructive disease.  By July 2023, EF has improved to 40 to 45% with severe hypokinesis of the LV and entire septal wall, trivial MR.  It was felt that her cardiomyopathy might be related to left bundle branch block.  Dr. Audie Box was going to discuss with the EP service, however suspected EP likely will recommend continue medical therapy for now given improvement in the ejection fraction.  She is on metoprolol, Entresto, spironolactone and SGLT2 inhibitor.  She is on 40 mg daily of Lasix.  Patient was last seen by Dr. Audie Box in July 2023 at which time she was doing well.  I last saw the patient on 12/21/2021 for follow-up and evaluation of ankle edema and palpitation.  She has been noticing ankle edema for the past month.  On physical exam, she only had trace amount of ankle edema and no lower leg edema.  Her lung was clear.  I suspect that she was nearing euvolemic level.  Her weight has not changed significantly.  I instructed the patient to continue observation and if her weight increased by more than 3 pounds overnight or 5 pounds in a single week, they have the option of giving additional tablet of as needed Lasix.  As for her palpitation, she has been noticing increased heart rate when she tries to get up and  walk, however she says her palpitation is only is associated with body position changes and it does not occur spontaneously.  My suspicion for irregular heart rhythm is quite low.  Based on family members vital check, when she does have this palpitation, her heart rate only go up to the 90s.  I decided to hold off on heart monitor.  On physical exam, I did not think the patient was severely dehydrated.  Subsequent lab work shows stable renal function the electrolyte.  Family called back on 01/02/2022 complaining of leg edema.  Her furosemide was increased to 40 mg twice a day for 2 days.  Patient presents today for follow-up accompanied by husband.  After 2 days of diuretic, her lower extremity edema has resolved.  Previously, patient was taking 20 mg daily, given the recent increased edema, I decided to return the patient to 40 mg daily after completing the higher dose.  Her lung is clear on exam.  She will need a basic metabolic panel in 1 week.  As long as her renal function is stable, I likely will continue on the 40 mg daily dosing of Lasix.  She is aware to take an additional 40 mg in the afternoon on an as-needed basis if her weight increased by more than 3 pounds overnight, 5 pounds in a single week or has worsening leg edema.  Past Medical History:  Diagnosis Date   CHF (congestive heart failure) (HCC)    COPD (chronic obstructive pulmonary disease) (HCC)    Coronary artery disease    GAD (generalized anxiety disorder)    GERD (gastroesophageal reflux disease)    Hypercholesteremia    Hypertension     Past Surgical History:  Procedure Laterality Date   ABDOMINAL HYSTERECTOMY     BLADDER SURGERY     RIGHT/LEFT HEART CATH AND CORONARY ANGIOGRAPHY N/A 05/01/2021   Procedure: RIGHT/LEFT HEART CATH AND CORONARY ANGIOGRAPHY;  Surgeon: Martinique, Peter M, MD;  Location: [facility];  Service: Cardiovascular;  Laterality: N/A;    Current Medications: Current Meds  Medication Sig    albuterol (VENTOLIN HFA) 108 (90 Base) MCG/ACT inhaler 2 puffs every 4 (four) hours as needed for shortness of breath.   ALPRAZolam (XANAX) 0.5 MG tablet TAKE ONE TABLET BY MOUTH 3 TIMES DAILY AS NEEDED FOR ANXIETY.   Cholecalciferol (VITAMIN D3) 125 MCG (5000 UT) TABS Take 5,000 Units by mouth daily.   cholestyramine (QUESTRAN) 4 GM/DOSE powder Take by mouth See admin instructions. 13 grams added to 2-6 oz of water as needed   Coenzyme Q10 150 MG CAPS Take 1 capsule by mouth daily.   cyclobenzaprine (FLEXERIL) 10 MG tablet Take 10 mg by mouth daily in the afternoon.   dapagliflozin propanediol (FARXIGA) 10 MG TABS tablet Take 1 tablet (10 mg total) by mouth daily before breakfast.   Fluticasone-Umeclidin-Vilant (TRELEGY ELLIPTA) 100-62.5-25 MCG/ACT AEPB Inhale 1 puff into the lungs daily as needed (Shortness of breath).   furosemide (LASIX) 40 MG tablet Take 40 mg by mouth daily.   Ginseng 250 MG CAPS Take 1 capsule by mouth daily.   metoprolol succinate (TOPROL-XL) 50 MG 24 hr tablet Take 1 tablet (50 mg total) by mouth daily for heart and blood pressure. Take with or immediately following a meal.   OVER THE COUNTER MEDICATION Take 3 tablets by mouth daily. Balance of Nature Fruit and Vegetables   promethazine (PHENERGAN) 12.5 MG tablet Take 12.5 mg by mouth every 6 (six) hours as needed for nausea.   risperiDONE (RISPERDAL) 1 MG tablet Take 1 tablet (1 mg total) by mouth at bedtime.   rosuvastatin (CRESTOR) 20 MG tablet Take 20 mg by mouth daily.   sacubitril-valsartan (ENTRESTO) 49-51 MG Take 1 tablet by mouth 2 (two) times daily.   traMADol (ULTRAM) 50 MG tablet Take 50 mg by mouth daily as needed (migraine).   traZODone (DESYREL) 100 MG tablet TAKE 1 TO2 TABLETS AT BEDTIME AS NEEDED FOR SLEEP. USE THE LOWEST DOSE THAT WORKS.   venlafaxine XR (EFFEXOR-XR) 75 MG 24 hr capsule 1 capsule in the morning and 2 capsules in the evening     Allergies:   Hydrochlorothiazide, Lisinopril, Norpramin  [desipramine], Other, Talwin [pentazocine], and Verapamil   Social History   Socioeconomic History   Marital status: Married    Spouse name: Not on file   Number of children: 2   Years of education: Not on file   Highest education level: Not on file  Occupational History   Occupation: Retired - Chemical engineer  Tobacco Use   Smoking status: Former    Packs/day: 0.50    Years: 55.00    Total pack years: 27.50    Types: Cigarettes    Quit date: 02/2021    Years since quitting: 0.8   Smokeless tobacco: Never  Substance and Sexual Activity   Alcohol use: Never  Drug use: Never   Sexual activity: Not on file  Other Topics Concern   Not on file  Social History Narrative   Not on file   Social Determinants of Health   Financial Resource Strain: Not on file  Food Insecurity: Not on file  Transportation Needs: Not on file  Physical Activity: Not on file  Stress: Not on file  Social Connections: Not on file     Family History: The patient's family history includes Stroke in her mother.  ROS:   Please see the history of present illness.     All other systems reviewed and are negative.  EKGs/Labs/Other Studies Reviewed:    The following studies were reviewed today:  Echo 10/01/2021  1. Left ventricular ejection fraction, by estimation, is 40 to 45%. The  left ventricle has mildly decreased function. The left ventricle  demonstrates regional wall motion abnormalities (see scoring  diagram/findings for description). Left ventricular  diastolic parameters are indeterminate. There is severe hypokinesis of the  left ventricular, entire septal wall.   2. Right ventricular systolic function is normal. The right ventricular  size is normal.   3. The mitral valve was not well visualized. Trivial mitral valve  regurgitation. No evidence of mitral stenosis.   4. The aortic valve was not well visualized. Aortic valve regurgitation  is not visualized. No aortic stenosis  is present.   5. The inferior vena cava is normal in size with greater than 50%  respiratory variability, suggesting right atrial pressure of 3 mmHg.   Comparison(s): No significant change from prior study. EF improved  compared to prior study.   Conclusion(s)/Recommendation(s): EF improved from prior; septal wall  motion abnormality consistent with LBBB.   EKG:  EKG is not ordered today.    Recent Labs: 02/23/2021: ALT 16; Magnesium 1.6 04/24/2021: BNP 52.7; Platelets 187 05/01/2021: Hemoglobin 12.2 12/21/2021: BUN 15; Creatinine, Ser 1.09; Potassium 4.5; Sodium 139  Recent Lipid Panel No results found for: "CHOL", "TRIG", "HDL", "CHOLHDL", "VLDL", "LDLCALC", "LDLDIRECT"   Risk Assessment/Calculations:           Physical Exam:    VS:  BP 118/62 (BP Location: Left Arm, Patient Position: Sitting, Cuff Size: Normal)   Pulse 72   Ht '5\' 2"'$  (1.575 m)   Wt 149 lb 3.2 oz (67.7 kg)   SpO2 95%   BMI 27.29 kg/m        Wt Readings from Last 3 Encounters:  01/10/22 149 lb 3.2 oz (67.7 kg)  12/21/21 150 lb 9.6 oz (68.3 kg)  10/10/21 151 lb 12.8 oz (68.9 kg)     GEN:  Well nourished, well developed in no acute distress HEENT: Normal NECK: No JVD; No carotid bruits LYMPHATICS: No lymphadenopathy CARDIAC: RRR, no murmurs, rubs, gallops RESPIRATORY:  Clear to auscultation without rales, wheezing or rhonchi  ABDOMEN: Soft, non-tender, non-distended MUSCULOSKELETAL:  No edema; No deformity  SKIN: Warm and dry NEUROLOGIC:  Alert and oriented x 3 PSYCHIATRIC:  Normal affect   ASSESSMENT:    1. Chronic systolic CHF (congestive heart failure) ([place])   2. Primary hypertension   3. Hyperlipidemia LDL goal <70   4. Controlled type 2 diabetes mellitus without complication, without long-term current use of insulin ([place])   5. Coronary artery disease involving native coronary artery of native heart without angina pectoris    PLAN:    In order of problems listed above:  Chronic  systolic heart failure: Most recent echocardiogram obtained in July 2023 showed EF 40  to 45%.  Previously, it was suspected her cardiomyopathy may be related to left bundle branch block, however since EF improved with medical therapy, she was not referred for CRT-D device.  She recently has been having some leg edema.  Instead of 40 mg daily of Lasix, she was previously only taking 1/2 tablet daily.  Due to the recent leg edema, she was instructed to increase Lasix to 40 mg twice a day for 2 days.  She has completed 2 days of higher dose of diuretic, lower extremity edema has resolved.  Instead of going back to the previous 20 mg daily of Lasix, I asked the patient to go back down to 40 mg daily instead.  She is aware to take additional dose of Lasix as needed in the afternoon if she has worsening leg edema, or her weight increased by more than 3 pounds overnight, or 5 pounds in a single week.  Hypertension: Blood pressure well controlled  Hyperlipidemia: On Crestor  DM2: Managed by primary care provider  CAD: Minimal disease seen on the previous cardiac catheterization.           Medication Adjustments/Labs and Tests Ordered: Current medicines are reviewed at length with the patient today.  Concerns regarding medicines are outlined above.  Orders Placed This Encounter  Procedures   Basic metabolic panel   No orders of the defined types were placed in this encounter.   Patient Instructions  Medication Instructions:  TAKE Lasix 40 mg daily. May take a tablet in the afternoon if needed if leg swelling or weight gain of 3 lbs overnight or 5 lbs in a week  *If you need a refill on your cardiac medications before your next appointment, please call your pharmacy*  Lab Work: Your physician recommends that you return for lab work in 1 week:  BMP  If you have labs (blood work) drawn today and your tests are completely normal, you will receive your results only by: [place] (if you  have [place]) OR A paper copy in the mail If you have any lab test that is abnormal or we need to change your treatment, we will call you to review the results.  Testing/Procedures: NONE ordered at this time of appointment   Follow-Up: At [facility], you and your health needs are our priority.  As part of our continuing mission to provide you with exceptional heart care, we have created designated Provider Care Teams.  These Care Teams include your primary Cardiologist (physician) and Advanced Practice Providers (APPs -  Physician Assistants and Nurse Practitioners) who all work together to provide you with the care you need, when you need it.   Your next appointment:   As previously scheduled   The format for your next appointment:   In Person  Provider:   Evalina Field, MD     Other Instructions Continue to monitor blood pressure at home. BP goal top number 100-130. If your top number is consistently below 100 please give our office a call.   Important Information About Sugar         Hilbert Corrigan, [place]  01/12/2022 10:42 PM    [facility]

## 2022-01-10 NOTE — Patient Instructions (Signed)
Medication Instructions:  TAKE Lasix 40 mg daily. May take a tablet in the afternoon if needed if leg swelling or weight gain of 3 lbs overnight or 5 lbs in a week  *If you need a refill on your cardiac medications before your next appointment, please call your pharmacy*  Lab Work: Your physician recommends that you return for lab work in 1 week:  BMP  If you have labs (blood work) drawn today and your tests are completely normal, you will receive your results only by: [place] (if you have [place]) OR A paper copy in the mail If you have any lab test that is abnormal or we need to change your treatment, we will call you to review the results.  Testing/Procedures: NONE ordered at this time of appointment   Follow-Up: At [facility], you and your health needs are our priority.  As part of our continuing mission to provide you with exceptional heart care, we have created designated Provider Care Teams.  These Care Teams include your primary Cardiologist (physician) and Advanced Practice Providers (APPs -  Physician Assistants and Nurse Practitioners) who all work together to provide you with the care you need, when you need it.   Your next appointment:   As previously scheduled   The format for your next appointment:   In Person  Provider:   Evalina Field, MD     Other Instructions Continue to monitor blood pressure at home. BP goal top number 100-130. If your top number is consistently below 100 please give our office a call.   Important Information About Sugar

## 2022-01-12 ENCOUNTER — Encounter: Payer: Self-pay | Admitting: Physician Assistant

## 2022-01-17 DIAGNOSIS — I5022 Chronic systolic (congestive) heart failure: Secondary | ICD-10-CM | POA: Diagnosis not present

## 2022-01-18 LAB — BASIC METABOLIC PANEL
BUN/Creatinine Ratio: 13 (ref 12–28)
BUN: 15 mg/dL (ref 8–27)
CO2: 27 mmol/L (ref 20–29)
Calcium: 9 mg/dL (ref 8.7–10.3)
Chloride: 97 mmol/L (ref 96–106)
Creatinine, Ser: 1.12 mg/dL — ABNORMAL HIGH (ref 0.57–1.00)
Glucose: 136 mg/dL — ABNORMAL HIGH (ref 70–99)
Potassium: 4.2 mmol/L (ref 3.5–5.2)
Sodium: 137 mmol/L (ref 134–144)
eGFR: 51 mL/min/{1.73_m2} — ABNORMAL LOW (ref >59)

## 2022-04-04 ENCOUNTER — Ambulatory Visit: Payer: PPO | Admitting: Cardiovascular Disease

## 2022-04-16 NOTE — Progress Notes (Unsigned)
Cardiology Office Note:   Date:  04/18/2022  NAME:  Melinda Gomez    MRN: [id number] DOB:  [birth year]   PCP:  Lavone Orn, MD  Cardiologist:  Evalina Field, MD  Electrophysiologist:  None   Referring MD: Lavone Orn, MD   Chief Complaint  Patient presents with   Follow-up   History of Present Illness:   Melinda Gomez is a 77 y.o. female with a hx of CHF, LBBB, non-obstructive CAD, HLD who presents for follow-up.  She reports low energy.  Weight is stable.  No signs of volume overload.  Family also describes difficulties with balance.  She reports no chest pain or trouble breathing.  Her most recent echo showed an EF of 45%.  She had nonobstructive CAD.  LDL is at goal.  Her diabetes seems to be controlled.  Her exam is normal.  No bradycardia reported.  I suspect this is deconditioning.  Overall euvolemic and stable from a heart failure standpoint.  Problem List COPD/Tobacco abuse HTN HLD LBBB, QRS 031 ms Systolic HF (non-ischemic) -02/2021: EF 25-30% -09/2021: EF 40-45% -MPI infarct 02/23/2021 -LHC 30% RCA (non-obstructive) 6. HLD -T chol 125, HDL 52, LDL 43, TG 186 7. DM -A1c 6.7  Past Medical History: Past Medical History:  Diagnosis Date   CHF (congestive heart failure) (HCC)    COPD (chronic obstructive pulmonary disease) (HCC)    Coronary artery disease    GAD (generalized anxiety disorder)    GERD (gastroesophageal reflux disease)    Hypercholesteremia    Hypertension     Past Surgical History: Past Surgical History:  Procedure Laterality Date   ABDOMINAL HYSTERECTOMY     BLADDER SURGERY     RIGHT/LEFT HEART CATH AND CORONARY ANGIOGRAPHY N/A 05/01/2021   Procedure: RIGHT/LEFT HEART CATH AND CORONARY ANGIOGRAPHY;  Surgeon: Martinique, Peter M, MD;  Location: [facility];  Service: Cardiovascular;  Laterality: N/A;    Current Medications: Current Meds  Medication Sig   albuterol (VENTOLIN HFA) 108 (90 Base) MCG/ACT inhaler 2 puffs every 4 (four)  hours as needed for shortness of breath.   ALPRAZolam (XANAX) 0.5 MG tablet TAKE ONE TABLET BY MOUTH 3 TIMES DAILY AS NEEDED FOR ANXIETY.   Cholecalciferol (VITAMIN D3) 125 MCG (5000 UT) TABS Take 5,000 Units by mouth daily.   cholestyramine (QUESTRAN) 4 GM/DOSE powder Take by mouth See admin instructions. 13 grams added to 2-6 oz of water as needed   Coenzyme Q10 150 MG CAPS Take 1 capsule by mouth daily.   cyclobenzaprine (FLEXERIL) 10 MG tablet Take 10 mg by mouth as needed for muscle spasms.   dapagliflozin propanediol (FARXIGA) 10 MG TABS tablet Take 1 tablet (10 mg total) by mouth daily before breakfast.   diphenhydrAMINE (BENADRYL) 25 mg capsule Take 25 mg by mouth every 6 (six) hours as needed for allergies.   Fluticasone-Umeclidin-Vilant (TRELEGY ELLIPTA) 100-62.5-25 MCG/ACT AEPB Inhale 1 puff into the lungs daily as needed (Shortness of breath).   furosemide (LASIX) 40 MG tablet Take 20 mg by mouth daily.   Ginseng 250 MG CAPS Take 1 capsule by mouth daily.   metoprolol succinate (TOPROL-XL) 50 MG 24 hr tablet Take 1 tablet (50 mg total) by mouth daily for heart and blood pressure. Take with or immediately following a meal.   OVER THE COUNTER MEDICATION Take 3 tablets by mouth daily. Balance of Nature Fruit and Vegetables   promethazine (PHENERGAN) 12.5 MG tablet Take 12.5 mg by mouth every 6 (six) hours  as needed for nausea.   risperiDONE (RISPERDAL) 1 MG tablet Take 1 tablet (1 mg total) by mouth at bedtime.   rosuvastatin (CRESTOR) 20 MG tablet Take 20 mg by mouth daily.   sacubitril-valsartan (ENTRESTO) 49-51 MG Take 1 tablet by mouth 2 (two) times daily.   spironolactone (ALDACTONE) 25 MG tablet TAKE 1/2 TABLET BY MOUTH DAILY FOR FLUID.   traMADol (ULTRAM) 50 MG tablet Take 50 mg by mouth daily as needed (migraine).   traZODone (DESYREL) 100 MG tablet TAKE 1 TO2 TABLETS AT BEDTIME AS NEEDED FOR SLEEP. USE THE LOWEST DOSE THAT WORKS. (Patient taking differently: 200 mg at bedtime.  TAKE 1 TO2 TABLETS AT BEDTIME AS NEEDED FOR SLEEP. USE THE LOWEST DOSE THAT WORKS.)   venlafaxine XR (EFFEXOR-XR) 75 MG 24 hr capsule 1 capsule in the morning and 2 capsules in the evening     Allergies:    Hydrochlorothiazide, Lisinopril, Norpramin [desipramine], Other, Talwin [pentazocine], and Verapamil   Social History: Social History   Socioeconomic History   Marital status: Married    Spouse name: Not on file   Number of children: 2   Years of education: Not on file   Highest education level: Not on file  Occupational History   Occupation: Retired - Chemical engineer  Tobacco Use   Smoking status: Former    Packs/day: 0.50    Years: 55.00    Total pack years: 27.50    Types: Cigarettes    Quit date: 02/2021    Years since quitting: 1.1   Smokeless tobacco: Never  Substance and Sexual Activity   Alcohol use: Never   Drug use: Never   Sexual activity: Not on file  Other Topics Concern   Not on file  Social History Narrative   Not on file   Social Determinants of Health   Financial Resource Strain: Not on file  Food Insecurity: Not on file  Transportation Needs: Not on file  Physical Activity: Not on file  Stress: Not on file  Social Connections: Not on file     Family History: The patient's family history includes Stroke in her mother.  ROS:   All other ROS reviewed and negative. Pertinent positives noted in the HPI.     EKGs/Labs/Other Studies Reviewed:   The following studies were personally reviewed by me today:  TTE 10/02/2021  1. Left ventricular ejection fraction, by estimation, is 40 to 45%. The  left ventricle has mildly decreased function. The left ventricle  demonstrates regional wall motion abnormalities (see scoring  diagram/findings for description). Left ventricular  diastolic parameters are indeterminate. There is severe hypokinesis of the  left ventricular, entire septal wall.   2. Right ventricular systolic function is normal.  The right ventricular  size is normal.   3. The mitral valve was not well visualized. Trivial mitral valve  regurgitation. No evidence of mitral stenosis.   4. The aortic valve was not well visualized. Aortic valve regurgitation  is not visualized. No aortic stenosis is present.   5. The inferior vena cava is normal in size with greater than 50%  respiratory variability, suggesting right atrial pressure of 3 mmHg.   LHC 05/01/2021  Minor nonobstructive CAD Normal LV filling pressure 15 mm Hg Upper normal right heart pressures. PAP mean 23 mm Hg Normal cardiac output. Index 2.86     Recent Labs: 04/24/2021: BNP 52.7; Platelets 187 05/01/2021: Hemoglobin 12.2 01/17/2022: BUN 15; Creatinine, Ser 1.12; Potassium 4.2; Sodium 137   Recent  Lipid Panel No results found for: "CHOL", "TRIG", "HDL", "CHOLHDL", "VLDL", "LDLCALC", "LDLDIRECT"  Physical Exam:   VS:  BP (!) 110/58   Pulse 70   Ht '5\' 2"'$  (1.575 m)   Wt 149 lb (67.6 kg)   BMI 27.25 kg/m    Wt Readings from Last 3 Encounters:  04/18/22 149 lb (67.6 kg)  01/10/22 149 lb 3.2 oz (67.7 kg)  12/21/21 150 lb 9.6 oz (68.3 kg)    General: Well nourished, well developed, in no acute distress Head: Atraumatic, normal size  Eyes: PEERLA, EOMI  Neck: Supple, no JVD Endocrine: No thryomegaly Cardiac: Normal S1, S2; RRR; no murmurs, rubs, or gallops Lungs: Clear to auscultation bilaterally, no wheezing, rhonchi or rales  Abd: Soft, nontender, no hepatomegaly  Ext: No edema, pulses 2+ Musculoskeletal: No deformities, BUE and BLE strength normal and equal Skin: Warm and dry, no rashes   Neuro: Alert and oriented to person, place, time, and situation, CNII-XII grossly intact, no focal deficits  Psych: Normal mood and affect   ASSESSMENT:   Melinda Gomez is a 77 y.o. female who presents for the following: 1. Chronic systolic CHF (congestive heart failure) ([facility])   2. Primary hypertension   3. LBBB (left bundle branch block)   4.  Hyperlipidemia LDL goal <70   5. Coronary artery disease involving native coronary artery of native heart without angina pectoris   6. Physical deconditioning     PLAN:   1. Chronic systolic CHF (congestive heart failure) ([place]) 2. Primary hypertension 3. LBBB (left bundle branch block) -Nonischemic cardiomyopathy.  Suspect this is related to left bundle branch block.  Her ejection fraction has recovered to 45%.  She will continue metoprolol succinate 50 mg daily, Entresto 49-51 mg twice daily, Aldactone 25 mg daily.  She is also on Farxiga 10 mg daily.  No signs of volume overload.  She takes 20 mg of Lasix daily which she should continue.  Given her continued report of low energy we will repeat her echo.  I suspect this is deconditioning.  I will order physical therapy.  I encouraged her to become more active. -She will see me back in 3 months to discuss further.  4. Hyperlipidemia LDL goal <70 5. Coronary artery disease involving native coronary artery of native heart without angina pectoris -Nonobstructive CAD.  Continue with statin therapy.  LDL at goal.  6. Physical deconditioning -I think this is the bigger issue here.  Home health referral for physical therapy.  Disposition: Return in about 3 months (around 07/18/2022).  Medication Adjustments/Labs and Tests Ordered: Current medicines are reviewed at length with the patient today.  Concerns regarding medicines are outlined above.  Orders Placed This Encounter  Procedures   CBC   Comprehensive metabolic panel   TSH   Brain natriuretic peptide   ECHOCARDIOGRAM COMPLETE   No orders of the defined types were placed in this encounter.   Patient Instructions  Medication Instructions:  The current medical regimen is effective;  continue present plan and medications.  *If you need a refill on your cardiac medications before your next appointment, please call your pharmacy*   Lab Work: CBC, CMET, TSH, BNP today   If you have  labs (blood work) drawn today and your tests are completely normal, you will receive your results only by: [place] (if you have MyChart) OR A paper copy in the mail If you have any lab test that is abnormal or we need to change your treatment,  we will call you to review the results.   Testing/Procedures:  Echocardiogram - Your physician has requested that you have an echocardiogram. Echocardiography is a painless test that uses sound waves to create images of your heart. It provides your doctor with information about the size and shape of your heart and how well your heart's chambers and valves are working. This procedure takes approximately one hour. There are no restrictions for this procedure.     Follow-Up: At [facility], you and your health needs are our priority.  As part of our continuing mission to provide you with exceptional heart care, we have created designated Provider Care Teams.  These Care Teams include your primary Cardiologist (physician) and Advanced Practice Providers (APPs -  Physician Assistants and Nurse Practitioners) who all work together to provide you with the care you need, when you need it.  We recommend signing up for the patient portal called "MyChart".  Sign up information is provided on this After Visit Summary.  MyChart is used to connect with patients for Virtual Visits (Telemedicine).  Patients are able to view lab/test results, encounter notes, upcoming appointments, etc.  Non-urgent messages can be sent to your provider as well.   To learn more about what you can do with MyChart, go to [email or web address].    Your next appointment:   3 month(s)  Provider:   Evalina Field, MD   Other Instructions PT ordered- they will be in contact with you.    Time Spent with Patient: I have spent a total of 35 minutes with patient reviewing hospital notes, telemetry, EKGs, labs and examining the patient as well as establishing an assessment  and plan that was discussed with the patient.  > 50% of time was spent in direct patient care.  Signed, Addison Naegeli. Audie Box, MD, [place]  [address] [place], [place] [address] [phone]  04/18/2022 4:24 PM

## 2022-04-18 ENCOUNTER — Ambulatory Visit: Payer: PPO | Attending: Cardiovascular Disease | Admitting: Cardiovascular Disease

## 2022-04-18 ENCOUNTER — Other Ambulatory Visit: Payer: Self-pay | Admitting: Cardiovascular Disease

## 2022-04-18 ENCOUNTER — Encounter: Payer: Self-pay | Admitting: Cardiovascular Disease

## 2022-04-18 VITALS — BP 110/58 | HR 70 | Ht 62.0 in | Wt 149.0 lb

## 2022-04-18 DIAGNOSIS — E785 Hyperlipidemia, unspecified: Secondary | ICD-10-CM

## 2022-04-18 DIAGNOSIS — I2510 Atherosclerotic heart disease of native coronary artery without angina pectoris: Secondary | ICD-10-CM | POA: Diagnosis not present

## 2022-04-18 DIAGNOSIS — I447 Left bundle-branch block, unspecified: Secondary | ICD-10-CM | POA: Diagnosis not present

## 2022-04-18 DIAGNOSIS — R5381 Other malaise: Secondary | ICD-10-CM | POA: Diagnosis not present

## 2022-04-18 DIAGNOSIS — I10 Essential (primary) hypertension: Secondary | ICD-10-CM

## 2022-04-18 DIAGNOSIS — I5022 Chronic systolic (congestive) heart failure: Secondary | ICD-10-CM

## 2022-04-18 NOTE — Patient Instructions (Signed)
Medication Instructions:  The current medical regimen is effective;  continue present plan and medications.  *If you need a refill on your cardiac medications before your next appointment, please call your pharmacy*   Lab Work: CBC, CMET, TSH, BNP today   If you have labs (blood work) drawn today and your tests are completely normal, you will receive your results only by: [place] (if you have MyChart) OR A paper copy in the mail If you have any lab test that is abnormal or we need to change your treatment, we will call you to review the results.   Testing/Procedures:  Echocardiogram - Your physician has requested that you have an echocardiogram. Echocardiography is a painless test that uses sound waves to create images of your heart. It provides your doctor with information about the size and shape of your heart and how well your heart's chambers and valves are working. This procedure takes approximately one hour. There are no restrictions for this procedure.     Follow-Up: At [facility], you and your health needs are our priority.  As part of our continuing mission to provide you with exceptional heart care, we have created designated Provider Care Teams.  These Care Teams include your primary Cardiologist (physician) and Advanced Practice Providers (APPs -  Physician Assistants and Nurse Practitioners) who all work together to provide you with the care you need, when you need it.  We recommend signing up for the patient portal called "MyChart".  Sign up information is provided on this After Visit Summary.  MyChart is used to connect with patients for Virtual Visits (Telemedicine).  Patients are able to view lab/test results, encounter notes, upcoming appointments, etc.  Non-urgent messages can be sent to your provider as well.   To learn more about what you can do with MyChart, go to [email or web address].    Your next appointment:   3 month(s)  Provider:    Evalina Field, MD   Other Instructions PT ordered- they will be in contact with you.

## 2022-04-18 NOTE — Addendum Note (Signed)
Addended by: Caprice Beaver T on: 04/18/2022 05:00 PM   Modules accepted: Orders

## 2022-04-19 LAB — COMPREHENSIVE METABOLIC PANEL
ALT: 20 IU/L (ref 0–32)
AST: 19 IU/L (ref 0–40)
Albumin/Globulin Ratio: 2.1 (ref 1.2–2.2)
Albumin: 4.6 g/dL (ref 3.8–4.8)
Alkaline Phosphatase: 69 IU/L (ref 44–121)
BUN/Creatinine Ratio: 13 (ref 12–28)
BUN: 17 mg/dL (ref 8–27)
Bilirubin Total: <0.2 mg/dL (ref 0.0–1.2)
CO2: 26 mmol/L (ref 20–29)
Calcium: 10.2 mg/dL (ref 8.7–10.3)
Chloride: 93 mmol/L — ABNORMAL LOW (ref 96–106)
Creatinine, Ser: 1.28 mg/dL — ABNORMAL HIGH (ref 0.57–1.00)
Globulin, Total: 2.2 g/dL (ref 1.5–4.5)
Glucose: 108 mg/dL — ABNORMAL HIGH (ref 70–99)
Potassium: 4.2 mmol/L (ref 3.5–5.2)
Sodium: 138 mmol/L (ref 134–144)
Total Protein: 6.8 g/dL (ref 6.0–8.5)
eGFR: 44 mL/min/{1.73_m2} — ABNORMAL LOW (ref >59)

## 2022-04-19 LAB — CBC
Hematocrit: 43.9 % (ref 34.0–46.6)
Hemoglobin: 14.7 g/dL (ref 11.1–15.9)
MCH: 29.7 pg (ref 26.6–33.0)
MCHC: 33.5 g/dL (ref 31.5–35.7)
MCV: 89 fL (ref 79–97)
Platelets: 203 10*3/uL (ref 150–450)
RBC: 4.95 x10E6/uL (ref 3.77–5.28)
RDW: 12.9 % (ref 11.7–15.4)
WBC: 8.3 10*3/uL (ref 3.4–10.8)

## 2022-04-19 LAB — BRAIN NATRIURETIC PEPTIDE: BNP: 28.2 pg/mL (ref 0.0–100.0)

## 2022-04-19 LAB — TSH: TSH: 3.1 u[IU]/mL (ref 0.450–4.500)

## 2022-04-27 ENCOUNTER — Encounter: Payer: Self-pay | Admitting: Cardiovascular Disease

## 2022-05-06 ENCOUNTER — Encounter: Payer: Self-pay | Admitting: Cardiovascular Disease

## 2022-05-06 MED ORDER — ENTRESTO 49-51 MG PO TABS: 1.0000 | ORAL_TABLET | Freq: Two times a day (BID) | ORAL | 0 refills | Status: DC

## 2022-05-14 ENCOUNTER — Ambulatory Visit (HOSPITAL_COMMUNITY): Payer: PPO | Attending: Cardiology

## 2022-05-14 DIAGNOSIS — I5022 Chronic systolic (congestive) heart failure: Secondary | ICD-10-CM

## 2022-05-14 LAB — ECHOCARDIOGRAM COMPLETE
Area-P 1/2: 5.01 cm2
S' Lateral: 2.8 cm

## 2022-05-16 ENCOUNTER — Other Ambulatory Visit: Payer: Self-pay | Admitting: Internal Medicine

## 2022-05-16 DIAGNOSIS — N644 Mastodynia: Secondary | ICD-10-CM

## 2022-05-20 ENCOUNTER — Other Ambulatory Visit: Payer: Self-pay | Admitting: Psychiatry

## 2022-05-20 DIAGNOSIS — F5105 Insomnia due to other mental disorder: Secondary | ICD-10-CM

## 2022-05-20 DIAGNOSIS — G4720 Circadian rhythm sleep disorder, unspecified type: Secondary | ICD-10-CM

## 2022-05-27 ENCOUNTER — Telehealth: Payer: Self-pay | Admitting: Cardiovascular Disease

## 2022-05-27 MED ORDER — DAPAGLIFLOZIN PROPANEDIOL 10 MG PO TABS: 10.0000 mg | ORAL_TABLET | Freq: Every day | ORAL | 3 refills | Status: DC

## 2022-05-27 NOTE — Telephone Encounter (Signed)
Sent new Farxiga rx as requested to [employer].  Called and spoke with pt/husband. He states that he does not need help with [facility] [place], he needs help with his pt assistance for [facility]. Husband states that the forms were already filled out and fax he was just calling for the status. Gave pt [employer] number [phone]. He will call to check the status and call back and let us know if they need anything from [place].

## 2022-05-27 NOTE — Telephone Encounter (Signed)
Pt c/o medication issue:  1. Name of Medication: dapagliflozin propanediol (FARXIGA) 10 MG TABS tablet   2. How are you currently taking this medication (dosage and times per day)?   Take 1 tablet (10 mg total) by mouth daily before breakfast.    3. Are you having a reaction (difficulty breathing--STAT)? No  4. What is your medication issue? Samantha from [facility] stated that the pt needed a new prescription sent in for patient assistance with [place] [employer] [place] [employer]. Aldona Bar stated that it could be fax to [place] [employer] [place] [employer].

## 2022-05-31 ENCOUNTER — Telehealth: Payer: Self-pay | Admitting: Cardiovascular Disease

## 2022-05-31 NOTE — Telephone Encounter (Signed)
Pt c/o medication issue:  1. Name of Medication:  sacubitril-valsartan (ENTRESTO) 49-51 MG  2. How are you currently taking this medication (dosage and times per day)?   3. Are you having a reaction (difficulty breathing--STAT)?   4. What is your medication issue?   Eta with [employer] states they received the provider's portion of the application but they are missing provider NPI and signature. She also mentions that they have not received any of the patient's portion of the form, including proof of income and insurance information. Please resend.  Phone#: [phone] Fax#: [phone]

## 2022-06-03 NOTE — Telephone Encounter (Signed)
Faxed over patient assistance information today with missing information.  Thanks!

## 2022-06-04 MED ORDER — ENTRESTO 49-51 MG PO TABS: 1.0000 | ORAL_TABLET | Freq: Two times a day (BID) | ORAL | 0 refills | Status: DC

## 2022-06-04 NOTE — Addendum Note (Signed)
Addended by: Caprice Beaver T on: 06/04/2022 04:22 PM   Modules accepted: Orders

## 2022-06-13 ENCOUNTER — Ambulatory Visit: Payer: PPO

## 2022-06-13 ENCOUNTER — Ambulatory Visit
Admission: RE | Admit: 2022-06-13 | Discharge: 2022-06-13 | Disposition: A | Discharge status: Home / Self Care | Payer: PPO | Source: Ambulatory Visit | Attending: Internal Medicine | Admitting: Internal Medicine

## 2022-06-13 DIAGNOSIS — N644 Mastodynia: Secondary | ICD-10-CM

## 2022-07-15 ENCOUNTER — Ambulatory Visit (INDEPENDENT_AMBULATORY_CARE_PROVIDER_SITE_OTHER): Payer: PPO | Admitting: Psychiatry

## 2022-07-15 ENCOUNTER — Encounter: Payer: Self-pay | Admitting: Psychiatry

## 2022-07-15 DIAGNOSIS — F22 Delusional disorders: Secondary | ICD-10-CM

## 2022-07-15 DIAGNOSIS — F331 Major depressive disorder, recurrent, moderate: Secondary | ICD-10-CM

## 2022-07-15 DIAGNOSIS — G4720 Circadian rhythm sleep disorder, unspecified type: Secondary | ICD-10-CM | POA: Diagnosis not present

## 2022-07-15 DIAGNOSIS — F4310 Post-traumatic stress disorder, unspecified: Secondary | ICD-10-CM | POA: Diagnosis not present

## 2022-07-15 DIAGNOSIS — F5105 Insomnia due to other mental disorder: Secondary | ICD-10-CM | POA: Diagnosis not present

## 2022-07-15 MED ORDER — VENLAFAXINE HCL ER 75 MG PO CP24: ORAL_CAPSULE | ORAL | 5 refills | Status: DC

## 2022-07-15 MED ORDER — RISPERIDONE 1 MG PO TABS: 1.0000 mg | ORAL_TABLET | Freq: Every day | ORAL | 5 refills | Status: DC

## 2022-07-15 MED ORDER — TRAZODONE HCL 100 MG PO TABS: ORAL_TABLET | ORAL | 1 refills | Status: DC

## 2022-07-15 MED ORDER — ALPRAZOLAM 0.5 MG PO TABS: ORAL_TABLET | ORAL | 5 refills | Status: DC

## 2022-07-15 NOTE — Progress Notes (Signed)
Melinda Gomez [id number] 03-26-1945 77 y.o.  Virtual Visit via Telephone Note  I connected with pt by telephone and verified that I am speaking with the correct person using two identifiers.   I discussed the limitations, risks, security and privacy concerns of performing an evaluation and management service by telephone and the availability of in person appointments. I also discussed with the patient that there may be a patient responsible charge related to this service. The patient expressed understanding and agreed to proceed.  I discussed the assessment and treatment plan with the patient. The patient was provided an opportunity to ask questions and all were answered. The patient agreed with the plan and demonstrated an understanding of the instructions.   The patient was advised to call back or seek an in-person evaluation if the symptoms worsen or if the condition fails to improve as anticipated.  I provided 30 minutes of non-face-to-face time during this encounter. The patient was located at with daughter and the provider was located office. [phone]  Session 1130 until noon   Subjective:   Patient ID:  Melinda Gomez is a 77 y.o. (DOB [birth year]) female.  Chief Complaint:  Chief Complaint  Patient presents with   Follow-up   Depression   Sleep disorder   Anxiety    Depression        Associated symptoms include fatigue.  Past medical history includes anxiety.   Anxiety Symptoms include nervous/anxious behavior and shortness of breath. Patient reports no chest pain or palpitations.     AMAI CAPPIELLO is followed up for chronic depression and anxiety and insomnia.  visit was July 15, 2018.  She was still dealing with chronic insomnia.  We discussed potential med changes but none were made per her request.  visit October 2020.  No meds were changed.  \July 15, 2019 appointment, the following is noted: 2 years of not sleeping.  Going to bed 10 pm .  Awakens a  lot 4-5 hours sleep.  Denies napping.  Fight staying awake.   Caffeine 3 cups of coffee up to 5 pm.  Has to have it when removes dentures.  Drowsy daytime. Not anxious nor depressed.  Occ too irritable but not in mos except yesterday.  Would like to sleep longer  No naps usually.  At night takes trazodone 150, hydroxyzine 25, no alprazolam at night.  Calm and [name] agrees.  Overall is quite well. No SE meds.  Usual Xanax prn anxiety not set time.  Not taken daily. Plan: Switch 5 PM coffee to decaf. Increase trazodone to 200 mg HS.    01/13/2020 appointment with the following noted: Sleep is better with 7-8 hours. Cut PM caffeine.   Overall mood good except for a couple of weeks.  [name] did something 25 years ago and that bears on her mind at times but turned it over to God.    06/21/2020 appt today: Taking trazodone 200 mg HS. Good days and bad days.  Taking more alprazolam than usual TID.  Found out 2 weeks ago that [name] has started up with girl he went to college with making her more nervous and upset.  He's aware she knows.  She thinks he's broken off the relationship but this is the third time.  Blames herself.  Can not have sex anymore.  More anxious and angry.  Otherwise mood is fine.  Sleep overall is better.  May nap in the morning.  No SE  01/04/21 appt today: Upset bc  [name] running around on her and admitted to it.  He's done it in the past.  Says he's doing it to get back at her for her doing it early in their relationship.  Xanax is helping her cope.   Her F was alcoholic. Some low days but kept it in control.  Proud of herself. No SE and seeing PCP regularly Still smoking and says it helps her stress.  Stress smoker. Otherwise fine without depression. Mood seems fine even when doesn't get enough sleep.  Patient reports stable mood and denies depressed or irritable moods.  Patient denies any recent difficulty with anxiety.  Patient denies difficulty with sleep initiation and maintenance.  Denies appetite disturbance.  Eating fine.  Patient reports that energy and motivation have been good.  Patient denies any difficulty with concentration.  Patient denies any suicidal ideation. Plan: Continue trazodone to 50 mg HS bc says more eoesn't help.   Option Seroquel for TR insomnia but greater SE risk.  Defer this Risperidone 1 mg HS Continue Effexor XR 225 mg daily. Continue alprazolam 0.5 mg TID prn.  Don't exceed dose.  07/10/2021 appointment with the following noted CO bad heart and DM with dx systolic HF, LBB. Sleeps a lot but is all night now. Taking 300 mg trazodone HS.  Can have hangover.   Quit smoking since here.   Appetite good but eating  better. Mood pretty good considering. No panic attacks.  Even in the hospital. Taking Xanax as needed. Gets frustrated can't do housework anymore physically.  01/05/22 appt noted: Doctor's said lower part of heart is not functioning right.  Feels tired and legs swelling.  Couldn't walk to the office.  [name] been a big help Living at home.   D noted pt longterm paranoid tendency thinking H cheating on her and suspicious about meds. Consistent with meds. Anxiety kind of bad this last week.  Can't talk about it, "too many ears".  No full panic. Sleep is good.   Yesterday down but not usually.  Talks herself out of it. No SE. No med changes desired. Still Xanax 0.5 mg TID, Taking 200 mg trazodone HS, venlafaxine XR 225 mg daily, risperidone 1 mg HS.  No Benadryl  07/15/22 appt noted: Continues psych meds. Need help to get around.   More stresssed with health problems.  Anxiety is affected.  Dep managed. No SE with meds.   D [name] and H [name] helping with meds.   Put on wt and up to 160#.   Denies sleepiness after Xnaax.  No panic.   Appetite good. Vocal change without reason.   No anger.  No fear except at her limitations.  No paranoia.    Past Psychiatric Medication Trials: Trazodone up to 150, alprazolam 0.5 TID,  hydroxyzine, mirtazapine 7.5 jerks, Belsomra hangover. risperidone, buspirone, venlafaxine,  Paxil 60 to 80 mg, Wellbutrin side effects, Prozac,   Review of Systems:  Review of Systems  Constitutional:  Positive for fatigue.  HENT:  Positive for voice change.   Respiratory:  Positive for shortness of breath.   Cardiovascular:  Negative for chest pain and palpitations.  Gastrointestinal:  Negative for diarrhea and vomiting.  Neurological:  Positive for weakness. Negative for tremors.       Recent 2 falls at night.  Not always at night.  Psychiatric/Behavioral:  Negative for sleep disturbance. The patient is nervous/anxious.   Has talked to Doctor about falls but no known diagnosis for it.  Not dizzy before falls. Can't  get herself up when she falls.  Medications: I have reviewed the patient's current medications.  Current Outpatient Medications  Medication Sig Dispense Refill   albuterol (VENTOLIN HFA) 108 (90 Base) MCG/ACT inhaler 2 puffs every 4 (four) hours as needed for shortness of breath.     Cholecalciferol (VITAMIN D3) 125 MCG (5000 UT) TABS Take 5,000 Units by mouth daily.     cholestyramine (QUESTRAN) 4 GM/DOSE powder Take by mouth See admin instructions. 13 grams added to 2-6 oz of water as needed     Coenzyme Q10 150 MG CAPS Take 1 capsule by mouth daily.     cyclobenzaprine (FLEXERIL) 10 MG tablet Take 10 mg by mouth as needed for muscle spasms.     dapagliflozin propanediol (FARXIGA) 10 MG TABS tablet Take 1 tablet (10 mg total) by mouth daily before breakfast. 90 tablet 3   diphenhydrAMINE (BENADRYL) 25 mg capsule Take 25 mg by mouth every 6 (six) hours as needed for allergies.     Fluticasone-Umeclidin-Vilant (TRELEGY ELLIPTA) 100-62.5-25 MCG/ACT AEPB Inhale 1 puff into the lungs daily as needed (Shortness of breath).     furosemide (LASIX) 40 MG tablet Take 20 mg by mouth daily.     Ginseng 250 MG CAPS Take 1 capsule by mouth daily.     metoprolol succinate (TOPROL-XL)  50 MG 24 hr tablet Take 1 tablet (50 mg total) by mouth daily for heart and blood pressure. Take with or immediately following a meal. 90 tablet 3   OVER THE COUNTER MEDICATION Take 3 tablets by mouth daily. Balance of Nature Fruit and Vegetables     promethazine (PHENERGAN) 12.5 MG tablet Take 12.5 mg by mouth every 6 (six) hours as needed for nausea.     rosuvastatin (CRESTOR) 20 MG tablet Take 20 mg by mouth daily.     sacubitril-valsartan (ENTRESTO) 49-51 MG Take 1 tablet by mouth 2 (two) times daily. 60 tablet 0   spironolactone (ALDACTONE) 25 MG tablet TAKE 1/2 TABLET BY MOUTH DAILY FOR FLUID. 45 tablet 2   traMADol (ULTRAM) 50 MG tablet Take 50 mg by mouth daily as needed (migraine).     ALPRAZolam (XANAX) 0.5 MG tablet TAKE ONE TABLET BY MOUTH 3 TIMES DAILY AS NEEDED FOR ANXIETY. 90 tablet 5   risperiDONE (RISPERDAL) 1 MG tablet Take 1 tablet (1 mg total) by mouth at bedtime. 30 tablet 5   traZODone (DESYREL) 100 MG tablet TAKE 1 TO2 TABLETS AT BEDTIME AS NEEDED FOR SLEEP. USE THE LOWEST DOSE THAT WORKS. 180 tablet 1   venlafaxine XR (EFFEXOR-XR) 75 MG 24 hr capsule 1 capsule in the morning and 2 capsules in the evening 90 capsule 5   No current facility-administered medications for this visit.    Medication Side Effects: None  No evidence for falls related to meds.  No pattern to falls. Allergies:  Allergies  Allergen Reactions   Hydrochlorothiazide     Other reaction(s): hyponatremia   Lisinopril     Other reaction(s): cough   Norpramin [Desipramine] Other (See Comments)    Jittery   Other Other (See Comments)    Estratest  depression & palpitation   Talwin [Pentazocine] Other (See Comments)    hallucinations   Verapamil     Other reaction(s): cough    Past Medical History:  Diagnosis Date   CHF (congestive heart failure)    COPD (chronic obstructive pulmonary disease)    Coronary artery disease    GAD (generalized anxiety disorder)    GERD (  gastroesophageal reflux  disease)    Hypercholesteremia    Hypertension     Family History  Problem Relation Age of Onset   Stroke Mother     Social History   Socioeconomic History   Marital status: Married    Spouse name: Not on file   Number of children: 2   Years of education: Not on file   Highest education level: Not on file  Occupational History   Occupation: Retired - IT consultant  Tobacco Use   Smoking status: Former    Packs/day: 0.50    Years: 55.00    Additional pack years: 0.00    Total pack years: 27.50    Types: Cigarettes    Quit date: 02/2021    Years since quitting: 1.3   Smokeless tobacco: Never  Substance and Sexual Activity   Alcohol use: Never   Drug use: Never   Sexual activity: Not on file  Other Topics Concern   Not on file  Social History Narrative   Not on file   Social Determinants of Health   Financial Resource Strain: Not on file  Food Insecurity: Not on file  Transportation Needs: Not on file  Physical Activity: Not on file  Stress: Not on file  Social Connections: Not on file  Intimate Partner Violence: Not on file    Past Medical History, Surgical history, Social history, and Family history were reviewed and updated as appropriate.   Please see review of systems for further details on the patient's review from today.   Objective:   Physical Exam:  There were no vitals taken for this visit.  Physical Exam Constitutional:      General: She is not in acute distress. Musculoskeletal:        General: No deformity.  Neurological:     Mental Status: She is alert and oriented to person, place, and time.     Cranial Nerves: No dysarthria.     Coordination: Coordination normal.  Psychiatric:        Attention and Perception: Attention and perception normal. She does not perceive auditory or visual hallucinations.        Mood and Affect: Mood is anxious. Mood is not depressed. Affect is not labile, blunt, angry, tearful or inappropriate.         Speech: Speech normal.        Behavior: Behavior normal. Behavior is cooperative.        Thought Content: Thought content normal. Thought content is not paranoid or delusional. Thought content does not include homicidal or suicidal ideation. Thought content does not include suicidal plan.        Cognition and Memory: Cognition and memory normal.        Judgment: Judgment normal.     Comments: Insight and judgment fair. Some anxiety     Lab Review:     Component Value Date/Time   NA 138 04/18/2022 1600   K 4.2 04/18/2022 1600   CL 93 (L) 04/18/2022 1600   CO2 26 04/18/2022 1600   GLUCOSE 108 (H) 04/18/2022 1600   GLUCOSE 133 (H) 05/01/2021 0604   BUN 17 04/18/2022 1600   CREATININE 1.28 (H) 04/18/2022 1600   CALCIUM 10.2 04/18/2022 1600   PROT 6.8 04/18/2022 1600   ALBUMIN 4.6 04/18/2022 1600   AST 19 04/18/2022 1600   ALT 20 04/18/2022 1600   ALKPHOS 69 04/18/2022 1600   BILITOT <0.2 04/18/2022 1600   GFRNONAA 37 (L) 05/01/2021 0604  GFRAA  07/30/2008 0945    >60        The eGFR has been calculated using the MDRD equation. This calculation has not been validated in all clinical situations. eGFR's persistently <60 mL/min signify possible Chronic Kidney Disease.       Component Value Date/Time   WBC 8.3 04/18/2022 1600   WBC 7.8 02/23/2021 0051   RBC 4.95 04/18/2022 1600   RBC 4.15 02/23/2021 0051   HGB 14.7 04/18/2022 1600   HCT 43.9 04/18/2022 1600   PLT 203 04/18/2022 1600   MCV 89 04/18/2022 1600   MCH 29.7 04/18/2022 1600   MCH 30.1 02/23/2021 0051   MCHC 33.5 04/18/2022 1600   MCHC 34.1 02/23/2021 0051   RDW 12.9 04/18/2022 1600   LYMPHSABS 1.6 02/22/2021 1514   MONOABS 0.8 02/22/2021 1514   EOSABS 0.1 02/22/2021 1514   BASOSABS 0.0 02/22/2021 1514    No results found for: "POCLITH", "LITHIUM"   No results found for: "PHENYTOIN", "PHENOBARB", "VALPROATE", "CBMZ"   .res Assessment: Plan:    Major depressive disorder, recurrent episode,  moderate - Plan: risperiDONE (RISPERDAL) 1 MG tablet, venlafaxine XR (EFFEXOR-XR) 75 MG 24 hr capsule  PTSD (post-traumatic stress disorder) - Plan: ALPRAZolam (XANAX) 0.5 MG tablet, risperiDONE (RISPERDAL) 1 MG tablet, venlafaxine XR (EFFEXOR-XR) 75 MG 24 hr capsule  Insomnia due to mental condition - Plan: traZODone (DESYREL) 100 MG tablet  Disturbed sleep rhythm - Plan: traZODone (DESYREL) 100 MG tablet  Paranoid disorder - Plan: risperiDONE (RISPERDAL) 1 MG tablet   Greater than 50% of 30 min face to face time with patient was spent on counseling and coordination of care. We discussed she is more anxious with health problems worsening but handling.  Spoke with daughter who is caretaking.  Patient tends to minimize her problems especially her health problems.  She is not motivated to take care of herself.  Spending a lot of time just sitting.  Will not do any chores.  Congestive heart failure is a contributing problem to low energy. Daughter confirms longstanding history of underlying paranoia but which is not highly disruptive.  No anger or acting out episodes lately.  Sleep hygiene in detail.  Lately her sleep has been  better than usual and able to reduce trazodone.  She does appear to be getting a greater quantity of sleep than last visit which is positive for her overall mental health..  We will avoid benzodiazepines at night for sleep because she has a high risk of tolerance with those.  She gets some initial benefit from the current medications.  We discussed the fall risk related to any sedative medication.    Supportive therapy dealing with decline of health and limited ability to do things.  Continue after 5 PM coffee to decaf.  Sleep is better.  Continue trazodone to 100-200 mg HS but try lower dose.   Risperidone 1 mg HS.  Unlikely this is causing fatigue. Helping anxiety and mild paranoia.  Consider reduction in future DT aging. Continue Effexor XR 225 mg daily. Continue  alprazolam 0.5 mg TID prn.  Don't exceed dose.  No use of Benadryl now  Overall her depression and anxiety associated with PTSD are fairly managed.  She has a long psychiatric history but in general has been more stable in the last few years except for the chronic insomnia complaints which are better. She has a fairly restricted life which is how in part she manages her anxiety but it does not contribute to  depression so she satisfied.  She does not want medicine change today.  No medicine changes today  Follow-up 6 months  Meredith Staggers, MD, DFAPA    Please see After Visit Summary for patient specific instructions.  Future Appointments  Date Time Provider Department Center  07/22/2022  1:00 PM O'Neal, Ronnald Ramp, MD [facility] None    No orders of the defined types were placed in this encounter.     -------------------------------c

## 2022-07-18 ENCOUNTER — Ambulatory Visit: Payer: PPO | Admitting: Cardiovascular Disease

## 2022-07-21 NOTE — Progress Notes (Unsigned)
Cardiology Office Note:   Date:  07/22/2022  NAME:  Melinda Gomez    MRN: [id number] DOB:  06-09-1945   PCP:  Kirby Funk, MD  Cardiologist:  Reatha Harps, MD  Electrophysiologist:  None   Referring MD: Kirby Funk, MD   Chief Complaint  Patient presents with   Follow-up        History of Present Illness:   Melinda Gomez is a 77 y.o. female with a hx of systolic HF, LBBB, DM who presents for follow-up.  She presents with her daughter and husband.  Ejection fraction has recovered.  Still having low energy.  Physical therapy order was denied.  Still having imbalance issues.  We will attempt to get her back with physical therapy.  No signs of volume overload.  Weight is up 5 to 7 pounds.  She seems to be stable.  No signs of worsening volume overload.  I did review a chest CT.  Mention of bronchiectasis and MAC infection.  She has never been evaluated by pulmonary.  Lung examination unremarkable.  We discussed evaluation by pulmonary.    Problem List COPD/Tobacco abuse HTN HLD LBBB, QRS 168 ms Systolic HF (non-ischemic) -02/2021: EF 25-30% -09/2021: EF 40-45% -04/2022: EF 50-55% -MPI infarct 02/23/2021 -LHC 30% RCA (non-obstructive) 6. HLD -T chol 134, HDL 47, LDL 61, TG 157 7. DM -A1c 6.5  Past Medical History: Past Medical History:  Diagnosis Date   CHF (congestive heart failure) (HCC)    COPD (chronic obstructive pulmonary disease) (HCC)    Coronary artery disease    GAD (generalized anxiety disorder)    GERD (gastroesophageal reflux disease)    Hypercholesteremia    Hypertension     Past Surgical History: Past Surgical History:  Procedure Laterality Date   ABDOMINAL HYSTERECTOMY     BLADDER SURGERY     RIGHT/LEFT HEART CATH AND CORONARY ANGIOGRAPHY N/A 05/01/2021   Procedure: RIGHT/LEFT HEART CATH AND CORONARY ANGIOGRAPHY;  Surgeon: [place], Peter M, MD;  Location: [facility];  Service: Cardiovascular;  Laterality: N/A;    Current  Medications: Current Meds  Medication Sig   albuterol (VENTOLIN HFA) 108 (90 Base) MCG/ACT inhaler 2 puffs every 4 (four) hours as needed for shortness of breath.   ALPRAZolam (XANAX) 0.5 MG tablet TAKE ONE TABLET BY MOUTH 3 TIMES DAILY AS NEEDED FOR ANXIETY.   Cholecalciferol (VITAMIN D3) 125 MCG (5000 UT) TABS Take 5,000 Units by mouth daily.   cholestyramine (QUESTRAN) 4 GM/DOSE powder Take by mouth See admin instructions. 13 grams added to 2-6 oz of water as needed   Coenzyme Q10 150 MG CAPS Take 1 capsule by mouth daily.   cyclobenzaprine (FLEXERIL) 10 MG tablet Take 10 mg by mouth as needed for muscle spasms.   dapagliflozin propanediol (FARXIGA) 10 MG TABS tablet Take 1 tablet (10 mg total) by mouth daily before breakfast.   diphenhydrAMINE (BENADRYL) 25 mg capsule Take 25 mg by mouth every 6 (six) hours as needed for allergies.   Fluticasone-Umeclidin-Vilant (TRELEGY ELLIPTA) 100-62.5-25 MCG/ACT AEPB Inhale 1 puff into the lungs daily as needed (Shortness of breath).   furosemide (LASIX) 40 MG tablet Take 20 mg by mouth daily.   metoprolol succinate (TOPROL-XL) 50 MG 24 hr tablet Take 1 tablet (50 mg total) by mouth daily for heart and blood pressure. Take with or immediately following a meal.   OVER THE COUNTER MEDICATION Take 3 tablets by mouth daily. Balance of Nature Fruit and Vegetables   promethazine (  PHENERGAN) 12.5 MG tablet Take 12.5 mg by mouth every 6 (six) hours as needed for nausea.   risperiDONE (RISPERDAL) 1 MG tablet Take 1 tablet (1 mg total) by mouth at bedtime.   rosuvastatin (CRESTOR) 20 MG tablet Take 20 mg by mouth daily.   sacubitril-valsartan (ENTRESTO) 49-51 MG Take 1 tablet by mouth 2 (two) times daily.   spironolactone (ALDACTONE) 25 MG tablet TAKE 1/2 TABLET BY MOUTH DAILY FOR FLUID.   traMADol (ULTRAM) 50 MG tablet Take 50 mg by mouth daily as needed (migraine).   traZODone (DESYREL) 100 MG tablet TAKE 1 TO2 TABLETS AT BEDTIME AS NEEDED FOR SLEEP. USE THE  LOWEST DOSE THAT WORKS.   venlafaxine XR (EFFEXOR-XR) 75 MG 24 hr capsule 1 capsule in the morning and 2 capsules in the evening     Allergies:    Hydrochlorothiazide, Lisinopril, Norpramin [desipramine], Other, Talwin [pentazocine], and Verapamil   Social History: Social History   Socioeconomic History   Marital status: Married    Spouse name: Not on file   Number of children: 2   Years of education: Not on file   Highest education level: Not on file  Occupational History   Occupation: Retired - IT consultant  Tobacco Use   Smoking status: Former    Packs/day: 0.50    Years: 55.00    Additional pack years: 0.00    Total pack years: 27.50    Types: Cigarettes    Quit date: 02/2021    Years since quitting: 1.4   Smokeless tobacco: Never  Substance and Sexual Activity   Alcohol use: Never   Drug use: Never   Sexual activity: Not on file  Other Topics Concern   Not on file  Social History Narrative   Not on file   Social Determinants of Health   Financial Resource Strain: Not on file  Food Insecurity: Not on file  Transportation Needs: Not on file  Physical Activity: Not on file  Stress: Not on file  Social Connections: Not on file     Family History: The patient's family history includes Stroke in her mother.  ROS:   All other ROS reviewed and negative. Pertinent positives noted in the HPI.     EKGs/Labs/Other Studies Reviewed:   The following studies were personally reviewed by me today:  Recent Labs: 04/18/2022: ALT 20; BNP 28.2; BUN 17; Creatinine, Ser 1.28; Hemoglobin 14.7; Platelets 203; Potassium 4.2; Sodium 138; TSH 3.100   Recent Lipid Panel No results found for: "CHOL", "TRIG", "HDL", "CHOLHDL", "VLDL", "LDLCALC", "LDLDIRECT"  Physical Exam:   VS:  BP 106/60   Pulse 75   Ht 5\' 3"  (1.6 m)   Wt 161 lb (73 kg)   SpO2 93%   BMI 28.52 kg/m    Wt Readings from Last 3 Encounters:  07/22/22 161 lb (73 kg)  04/18/22 149 lb (67.6 kg)   01/10/22 149 lb 3.2 oz (67.7 kg)    General: Well nourished, well developed, in no acute distress Head: Atraumatic, normal size  Eyes: PEERLA, EOMI  Neck: Supple, no JVD Endocrine: No thryomegaly Cardiac: Normal S1, S2; RRR; no murmurs, rubs, or gallops Lungs: Clear to auscultation bilaterally, no wheezing, rhonchi or rales  Abd: Soft, nontender, no hepatomegaly  Ext: trace edema  Musculoskeletal: No deformities, BUE and BLE strength normal and equal Skin: Warm and dry, no rashes   Neuro: Alert and oriented to person, place, time, and situation, CNII-XII grossly intact, no focal deficits  Psych: Normal  mood and affect   ASSESSMENT:   Melinda Gomez is a 77 y.o. female who presents for the following: 1. Chronic systolic CHF (congestive heart failure) (HCC)   2. Primary hypertension   3. LBBB (left bundle branch block)   4. Hyperlipidemia LDL goal <70   5. Coronary artery disease involving native coronary artery of native heart without angina pectoris   6. Imbalance   7. Bronchiectasis without complication (HCC)     PLAN:   1. Chronic systolic CHF (congestive heart failure) (HCC) -Ejection fraction has recovered.  She will continue metoprolol, Entresto, Aldactone and [place].  On Lasix 40 mg daily.  No signs of volume overload.  She will continue this.  2. Primary hypertension -Well-controlled.  No change.  3. LBBB (left bundle branch block) -QRS 168.  EF has recovered.  No need for CRT-D.  4. Hyperlipidemia LDL goal <70 5. Coronary artery disease involving native coronary artery of native heart without angina pectoris -Nonobstructive CAD.  Continue Crestor 20 mg daily.  LDL is at goal.  6. Imbalance -Referral to PT.  Having issues with imbalance.  Also deconditioned.  7. Bronchiectasis without complication (HCC) -Chest CT with concerns for bronchiectasis and MAC infection.  Still having symptoms of shortness of breath.  Referral to pulmonary.      Disposition:  Return in about 6 months (around 01/21/2023).  Medication Adjustments/Labs and Tests Ordered: Current medicines are reviewed at length with the patient today.  Concerns regarding medicines are outlined above.  Orders Placed This Encounter  Procedures   Ambulatory referral to Home Health   Ambulatory referral to Pulmonology   No orders of the defined types were placed in this encounter.   Patient Instructions  Medication Instructions:  The current medical regimen is effective;  continue present plan and medications.  *If you need a refill on your cardiac medications before your next appointment, please call your pharmacy*   Follow-Up: At [facility], you and your health needs are our priority.  As part of our continuing mission to provide you with exceptional heart care, we have created designated Provider Care Teams.  These Care Teams include your primary Cardiologist (physician) and Advanced Practice Providers (APPs -  Physician Assistants and Nurse Practitioners) who all work together to provide you with the care you need, when you need it.  We recommend signing up for the patient portal called "MyChart".  Sign up information is provided on this After Visit Summary.  MyChart is used to connect with patients for Virtual Visits (Telemedicine).  Patients are able to view lab/test results, encounter notes, upcoming appointments, etc.  Non-urgent messages can be sent to your provider as well.   To learn more about what you can do with MyChart, go to [email or web address].    Your next appointment:   6 month(s)  Provider:   Reatha Harps, MD     Referral to Pulmonary- they will call you to set up an appointment.     Time Spent with Patient: I have spent a total of 35 minutes with patient reviewing hospital notes, telemetry, EKGs, labs and examining the patient as well as establishing an assessment and plan that was discussed with the patient.  > 50% of time was spent in  direct patient care.  Signed, Lenna Gilford. Flora Lipps, MD, [facility]  [facility]  [address] [place], [place] [address] [phone]  07/22/2022 2:34 PM

## 2022-07-22 ENCOUNTER — Ambulatory Visit: Payer: PPO | Attending: Cardiovascular Disease | Admitting: Cardiovascular Disease

## 2022-07-22 ENCOUNTER — Encounter: Payer: Self-pay | Admitting: Cardiovascular Disease

## 2022-07-22 VITALS — BP 106/60 | HR 75 | Ht 63.0 in | Wt 161.0 lb

## 2022-07-22 DIAGNOSIS — E785 Hyperlipidemia, unspecified: Secondary | ICD-10-CM | POA: Diagnosis not present

## 2022-07-22 DIAGNOSIS — I10 Essential (primary) hypertension: Secondary | ICD-10-CM | POA: Diagnosis not present

## 2022-07-22 DIAGNOSIS — J479 Bronchiectasis, uncomplicated: Secondary | ICD-10-CM

## 2022-07-22 DIAGNOSIS — I5022 Chronic systolic (congestive) heart failure: Secondary | ICD-10-CM

## 2022-07-22 DIAGNOSIS — I447 Left bundle-branch block, unspecified: Secondary | ICD-10-CM | POA: Diagnosis not present

## 2022-07-22 DIAGNOSIS — R2689 Other abnormalities of gait and mobility: Secondary | ICD-10-CM

## 2022-07-22 DIAGNOSIS — I2510 Atherosclerotic heart disease of native coronary artery without angina pectoris: Secondary | ICD-10-CM

## 2022-07-22 NOTE — Patient Instructions (Addendum)
Medication Instructions:  The current medical regimen is effective;  continue present plan and medications.  *If you need a refill on your cardiac medications before your next appointment, please call your pharmacy*   Follow-Up: At [facility], you and your health needs are our priority.  As part of our continuing mission to provide you with exceptional heart care, we have created designated Provider Care Teams.  These Care Teams include your primary Cardiologist (physician) and Advanced Practice Providers (APPs -  Physician Assistants and Nurse Practitioners) who all work together to provide you with the care you need, when you need it.  We recommend signing up for the patient portal called "MyChart".  Sign up information is provided on this After Visit Summary.  MyChart is used to connect with patients for Virtual Visits (Telemedicine).  Patients are able to view lab/test results, encounter notes, upcoming appointments, etc.  Non-urgent messages can be sent to your provider as well.   To learn more about what you can do with MyChart, go to [email or web address].    Your next appointment:   6 month(s)  Provider:   Reatha Harps, MD     Referral to Pulmonary- they will call you to set up an appointment.

## 2022-08-14 ENCOUNTER — Encounter: Payer: Self-pay | Admitting: Cardiovascular Disease

## 2022-08-16 ENCOUNTER — Institutional Professional Consult (permissible substitution) (HOSPITAL_BASED_OUTPATIENT_CLINIC_OR_DEPARTMENT_OTHER): Payer: PPO | Admitting: Pulmonary Disease

## 2022-08-27 NOTE — Telephone Encounter (Signed)
Faxed over information needed

## 2022-09-03 ENCOUNTER — Ambulatory Visit (INDEPENDENT_AMBULATORY_CARE_PROVIDER_SITE_OTHER): Payer: PPO | Admitting: Pulmonary Disease

## 2022-09-03 ENCOUNTER — Encounter (HOSPITAL_BASED_OUTPATIENT_CLINIC_OR_DEPARTMENT_OTHER): Payer: Self-pay | Admitting: Pulmonary Disease

## 2022-09-03 VITALS — BP 100/56 | HR 76 | Temp 97.7°F | Ht 63.0 in | Wt 163.0 lb

## 2022-09-03 DIAGNOSIS — J479 Bronchiectasis, uncomplicated: Secondary | ICD-10-CM

## 2022-09-03 DIAGNOSIS — R062 Wheezing: Secondary | ICD-10-CM | POA: Diagnosis not present

## 2022-09-03 DIAGNOSIS — Z87891 Personal history of nicotine dependence: Secondary | ICD-10-CM

## 2022-09-03 MED ORDER — BREZTRI AEROSPHERE 160-9-4.8 MCG/ACT IN AERO: 2.0000 | INHALATION_SPRAY | Freq: Two times a day (BID) | RESPIRATORY_TRACT | 0 refills | Status: DC

## 2022-09-03 MED ORDER — ALBUTEROL SULFATE HFA 108 (90 BASE) MCG/ACT IN AERS: 2.0000 | INHALATION_SPRAY | RESPIRATORY_TRACT | 2 refills | Status: DC | PRN

## 2022-09-03 NOTE — Progress Notes (Signed)
Subjective:   PATIENT ID: Melinda Gomez GENDER: female DOB: 12-18-1945, MRN: [id number]  Chief Complaint  Patient presents with   Consult    Consult.     Reason for Visit: New consult for bronchiectasis, shortness of breath  Ms. [name] is a 77 year old female former with CAD, chronic systolic heart failure with normalized EF, DM2, HTN, depression and anxiety who presents for evaluation for bronchiectasis. Referred by Cardiology, Dr. Flora Lipps. Present with husband and daughter who provides additional history.  She reports long standing shortness of breath for >1 year that worsen with activity. Associated with nonproductive cough and wheezes with minimal activity. She is sedentary at baseline and needs assistance with getting dressed. Minimal activity can be straining for you. Family reports she has been debilitated for at least five years. Cardiology has ordered physical therapy. Was started on Trelegy and consistently taking in the last month. She does not feel like it works. Does not use her rescue inhaler.  Social History: Former smoker. Quit in 02/2021. Previously 1 ppd x 50 years.  I have personally reviewed patient's past medical/family/social history, allergies, current medications.  Past Medical History:  Diagnosis Date   CHF (congestive heart failure) (HCC)    COPD (chronic obstructive pulmonary disease) (HCC)    Coronary artery disease    GAD (generalized anxiety disorder)    GERD (gastroesophageal reflux disease)    Hypercholesteremia    Hypertension      Family History  Problem Relation Age of Onset   Stroke Mother      Social History   Occupational History   Occupation: Retired - IT consultant  Tobacco Use   Smoking status: Former    Packs/day: 0.50    Years: 55.00    Additional pack years: 0.00    Total pack years: 27.50    Types: Cigarettes    Quit date: 02/2021    Years since quitting: 1.5   Smokeless tobacco: Never  Substance and  Sexual Activity   Alcohol use: Never   Drug use: Never   Sexual activity: Not on file    Allergies  Allergen Reactions   Hydrochlorothiazide     Other reaction(s): hyponatremia   Lisinopril     Other reaction(s): cough   Norpramin [Desipramine] Other (See Comments)    Jittery   Other Other (See Comments)    Estratest  depression & palpitation   Talwin [Pentazocine] Other (See Comments)    hallucinations   Verapamil     Other reaction(s): cough     Outpatient Medications Prior to Visit  Medication Sig Dispense Refill   ALPRAZolam (XANAX) 0.5 MG tablet TAKE ONE TABLET BY MOUTH 3 TIMES DAILY AS NEEDED FOR ANXIETY. 90 tablet 5   Cholecalciferol (VITAMIN D3) 125 MCG (5000 UT) TABS Take 5,000 Units by mouth daily.     cholestyramine (QUESTRAN) 4 GM/DOSE powder Take by mouth See admin instructions. 13 grams added to 2-6 oz of water as needed     Coenzyme Q10 150 MG CAPS Take 1 capsule by mouth daily.     cyclobenzaprine (FLEXERIL) 10 MG tablet Take 10 mg by mouth as needed for muscle spasms.     dapagliflozin propanediol (FARXIGA) 10 MG TABS tablet Take 1 tablet (10 mg total) by mouth daily before breakfast. 90 tablet 3   diphenhydrAMINE (BENADRYL) 25 mg capsule Take 25 mg by mouth every 6 (six) hours as needed for allergies.     Fluticasone-Umeclidin-Vilant (TRELEGY ELLIPTA) 100-62.5-25  MCG/ACT AEPB Inhale 1 puff into the lungs daily as needed (Shortness of breath).     furosemide (LASIX) 40 MG tablet Take 20 mg by mouth daily.     metoprolol succinate (TOPROL-XL) 50 MG 24 hr tablet Take 1 tablet (50 mg total) by mouth daily for heart and blood pressure. Take with or immediately following a meal. 90 tablet 3   OVER THE COUNTER MEDICATION Take 3 tablets by mouth daily. Balance of Nature Fruit and Vegetables     promethazine (PHENERGAN) 12.5 MG tablet Take 12.5 mg by mouth every 6 (six) hours as needed for nausea.     risperiDONE (RISPERDAL) 1 MG tablet Take 1 tablet (1 mg total) by  mouth at bedtime. 30 tablet 5   rosuvastatin (CRESTOR) 20 MG tablet Take 20 mg by mouth daily.     sacubitril-valsartan (ENTRESTO) 49-51 MG Take 1 tablet by mouth 2 (two) times daily. 60 tablet 0   spironolactone (ALDACTONE) 25 MG tablet TAKE 1/2 TABLET BY MOUTH DAILY FOR FLUID. 45 tablet 2   traMADol (ULTRAM) 50 MG tablet Take 50 mg by mouth daily as needed (migraine).     traZODone (DESYREL) 100 MG tablet TAKE 1 TO2 TABLETS AT BEDTIME AS NEEDED FOR SLEEP. USE THE LOWEST DOSE THAT WORKS. 180 tablet 1   venlafaxine XR (EFFEXOR-XR) 75 MG 24 hr capsule 1 capsule in the morning and 2 capsules in the evening 90 capsule 5   albuterol (VENTOLIN HFA) 108 (90 Base) MCG/ACT inhaler 2 puffs every 4 (four) hours as needed for shortness of breath.     No facility-administered medications prior to visit.    Review of Systems  Constitutional:  Negative for chills, diaphoresis, fever, malaise/fatigue and weight loss.  HENT:  Negative for congestion.   Respiratory:  Positive for cough, shortness of breath and wheezing. Negative for hemoptysis and sputum production.   Cardiovascular:  Negative for chest pain, palpitations and leg swelling.     Objective:   Vitals:   09/03/22 0902  BP: (!) 100/56  Pulse: 76  Temp: 97.7 F (36.5 C)  TempSrc: Oral  SpO2: 92%  Weight: 163 lb (73.9 kg)  Height: 5\' 3"  (1.6 m)   SpO2: 92 % O2 Device: None (Room air)  Physical Exam: General: Well-appearing, no acute distress HENT: [place], AT Eyes: EOMI, no scleral icterus Respiratory: Clear to auscultation bilaterally.  No crackles, wheezing or rales Cardiovascular: RRR, -M/R/G, no JVD Extremities:-Edema,-tenderness Neuro: AAO x4, CNII-XII grossly intact Psych: Normal mood, normal affect  Data Reviewed:  Imaging: CT Chest 12/30/20 - Mild bronchiectasis with scattered mucoid and irritation  PFT: None on file  Labs: CBC    Component Value Date/Time   WBC 8.3 04/18/2022 1600   WBC 7.8 02/23/2021 0051   RBC  4.95 04/18/2022 1600   RBC 4.15 02/23/2021 0051   HGB 14.7 04/18/2022 1600   HCT 43.9 04/18/2022 1600   PLT 203 04/18/2022 1600   MCV 89 04/18/2022 1600   MCH 29.7 04/18/2022 1600   MCH 30.1 02/23/2021 0051   MCHC 33.5 04/18/2022 1600   MCHC 34.1 02/23/2021 0051   RDW 12.9 04/18/2022 1600   LYMPHSABS 1.6 02/22/2021 1514   MONOABS 0.8 02/22/2021 1514   EOSABS 0.1 02/22/2021 1514   BASOSABS 0.0 02/22/2021 1514   BMET    Component Value Date/Time   NA 138 04/18/2022 1600   K 4.2 04/18/2022 1600   CL 93 (L) 04/18/2022 1600   CO2 26 04/18/2022 1600   GLUCOSE  108 (H) 04/18/2022 1600   GLUCOSE 133 (H) 05/01/2021 0604   BUN 17 04/18/2022 1600   CREATININE 1.28 (H) 04/18/2022 1600   CALCIUM 10.2 04/18/2022 1600   EGFR 44 (L) 04/18/2022 1600   GFRNONAA 37 (L) 05/01/2021 0604        Assessment & Plan:   Discussion: 77 year old female former with CAD, chronic systolic heart failure with normalized EF, DM2, HTN, depression and anxiety who presents for evaluation for shortness of breath and bronchiectasis. Hold on PFTs due to debilitation. Suspect her symptoms are not well controlled due to inefficient inhaler technique. Would be better to change to MDI with spacer. Counseled and demonstrated inhaler technique in-office by me. Addressed questions and concerns. If symptoms remain persistent can step up ICS strength versus nebulizer regimen.  Bronchiectasis Shortness of breath, wheezing --STOP Trelegy for now. --START Breztri TWO puffs in the morning and evening. Use spacer --START Albuterol TWO puffs as needed for shortness of breath or wheezing  Health Maintenance Immunization History  Administered Date(s) Administered   Influenza, High Dose Seasonal PF 12/31/2017   Influenza, Quadrivalent, Recombinant, Inj, Pf 01/22/2019   Zoster Recombinat (Shingrix) 07/30/2017, 01/19/2018   CT Lung Screen - Will enroll  Orders Placed This Encounter  Procedures   Ambulatory Referral for  Lung Cancer Scre    Referral Priority:   Routine    Referral Type:   Consultation    Referral Reason:   Specialty Services Required    Number of Visits Requested:   1   Meds ordered this encounter  Medications   albuterol (VENTOLIN HFA) 108 (90 Base) MCG/ACT inhaler    Sig: Inhale 2 puffs into the lungs every 4 (four) hours as needed for shortness of breath or wheezing.    Dispense:  8 g    Refill:  2    Return in about 2 months (around 11/03/2022).  I have spent a total time of 45-minutes on the day of the appointment reviewing prior documentation, coordinating care and discussing medical diagnosis and plan with the patient/family. Imaging, labs and tests included in this note have been reviewed and interpreted independently by me.  Farooq Petrovich Mechele Collin, MD [facility] 09/03/2022 12:59 PM  Office Number [phone]

## 2022-09-03 NOTE — Patient Instructions (Signed)
Bronchiectasis Shortness of breath, wheezing --STOP Trelegy for now. --START Breztri TWO puffs in the morning and evening. Use spacer --START Albuterol TWO puffs as needed for shortness of breath or wheezing

## 2022-09-03 NOTE — Addendum Note (Signed)
Addended by: Arlyss Repress on: 09/03/2022 03:14 PM   Modules accepted: Orders

## 2022-09-04 ENCOUNTER — Encounter (HOSPITAL_COMMUNITY): Payer: Self-pay | Admitting: Speech Pathology

## 2022-09-04 ENCOUNTER — Ambulatory Visit (HOSPITAL_COMMUNITY): Payer: PPO | Attending: Otolaryngology | Admitting: Speech Pathology

## 2022-09-04 DIAGNOSIS — R490 Dysphonia: Secondary | ICD-10-CM | POA: Insufficient documentation

## 2022-09-04 NOTE — Therapy (Signed)
OUTPATIENT SPEECH LANGUAGE PATHOLOGY VOICE EVALUATION   Patient Name: Melinda Gomez MRN: [id number] DOB:11/26/1945, 77 y.o., female Today's Date: 09/04/2022  PCP: Hillard Danker REFERRING PROVIDER: Bud Face, MD  END OF SESSION:  End of Session - 09/04/22 1116     Visit Number 1    Number of Visits 7    Date for SLP Re-Evaluation 10/31/22    Authorization Type [employer]   hoarseness eff 03/25/22 ded-0 oop 3000 met 180.00 limit-no auth-no copay$10.00 co ins-0 angela ref#[id number]   SLP Start Time 1030    SLP Stop Time  1115    SLP Time Calculation (min) 45 min    Activity Tolerance Patient tolerated treatment well             Past Medical History:  Diagnosis Date   CHF (congestive heart failure) (HCC)    COPD (chronic obstructive pulmonary disease) (HCC)    Coronary artery disease    GAD (generalized anxiety disorder)    GERD (gastroesophageal reflux disease)    Hypercholesteremia    Hypertension    Past Surgical History:  Procedure Laterality Date   ABDOMINAL HYSTERECTOMY     BLADDER SURGERY     RIGHT/LEFT HEART CATH AND CORONARY ANGIOGRAPHY N/A 05/01/2021   Procedure: RIGHT/LEFT HEART CATH AND CORONARY ANGIOGRAPHY;  Surgeon: Swaziland, Peter M, MD;  Location: [facility] INVASIVE CV [facility];  Service: Cardiovascular;  Laterality: N/A;   Patient Active Problem List   Diagnosis Date Noted   Chronic systolic CHF (congestive heart failure) (HCC) 05/01/2021   Hypokalemia 02/23/2021   Hypertension    Hypercholesteremia    COPD (chronic obstructive pulmonary disease) (HCC)    GAD (generalized anxiety disorder)    Chest pain 02/22/2021   PTSD (post-traumatic stress disorder) 01/02/2018   GERD (gastroesophageal reflux disease) 01/02/2018   Chronic insomnia 01/02/2018    Onset date: 08/01/2022  REFERRING DIAG: R49.0 (ICD-10-CM) - Hoarse R49.0 (ICD-10-CM) - Dysphonia  THERAPY DIAG:  Hoarseness  Rationale for Evaluation and Treatment: Rehabilitation  SUBJECTIVE:    SUBJECTIVE STATEMENT: "Rough" Pt accompanied by: self and significant other  PERTINENT HISTORY: Ms. Melinda Gomez is a 77 year old female former smoker with CAD, chronic systolic heart failure with normalized EF, DM2, HTN, depression and anxiety who referred by ENT, Dr. Bud Face for voice evaluation and treatment due to long standing hoarseness and shortness of breath for >1 year that worsen with activity. Pt had flexible laryngoscopy with Dr. 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 She is also being seen by pulmonology (uses inhaler and has COPD).  PAIN:  Are you having pain? No  FALLS: Has patient fallen in last 6 months? 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  COGNITION: Overall cognitive status: Within functional limits for tasks assessed Areas of impairment:  N/A Functional deficits: Pt's spouse reports no difficulties with cognition, however Pt turned to spouse to help answer questions.  SOCIAL HISTORY: Occupation: N/A Water intake: suboptimal Caffeine/alcohol intake: excessive Daily voice use: moderate  PERCEPTUAL VOICE ASSESSMENT: Voice quality: hoarse and strained Vocal abuse: abnormal breathing pattern Resonance: normal Respiratory function: clavicular breathing  and speaking on residual capacity  OBJECTIVE VOICE ASSESSMENT: Maximum phonation time for sustained "ah": 16 seconds with glottal fry Conversational pitch average: 226.9 Hz Conversational loudness average: 50.4 dB S/z ratio: 10/11, .9 (Suggestive of  dysfunction >1.0)  PATIENT REPORTED OUTCOME MEASURES (PROM): V-RQOL: 22 The Voice Handicap Index-10 (VHI-10) was administered. This survey is a series of questions targeting the patient's perception of his/her own voice using a scale of 0-4 (0=Never, 4=Always). Score greater than 11 is abnormal. My voice makes it difficult for people to hear me. 2  People have difficulty understanding me in a noisy room. 2  My voice difficulties restrict personal and social life. 2 I feel left out of conversations because of my voice. 4  My voice problem causes me to lose income. 0  I feel as though I have to strain to produce voice. 4  The clarity of my voice is unpredictable. 4  My voice problem upsets me. 4  My voice makes me feel handicapped. 0  People ask, "What's wrong with your voice?" 0  TOTAL SCORE:  [X]  Abnormal (raw score >11) []  Normal 22/40    Respiration Maximum loudness (sustained phonation): 60 dB SPL  Loudness in connected speech (reading a paragraph): average of 50 dB SPL; decreased loudness Loudness in connected speech (spontaneous speech): average of 55 dB SPL; decreased loudness Maximum phonation duration: 16 seconds; WFL [Minimum: young female: 52 s, young fem: 51 s, elderly female: 74 s, elderly fem: 10 s ([place], [place], [place] 1987)]  Connected speech: reduced loudness ; speaks on residual air, reduced breath support  Resonance Conversational speech: WFL  Articulation  Alternating motion rates (AMR): WFL Sequential motion rates (SMR): WFL Repetition of words/sentences: WFL Connected speech: restricted motion of lips and jaw    TODAY'S TREATMENT:       Evaluation only                                                                                                                                  DATE: 09/04/22    PATIENT EDUCATION: Education details: Plan for trial of voice therapy to decrease vocal strain, glottal fry, and increase breath support, vocal hygiene Person  educated: Patient and Spouse Education method: Explanation and Demonstration Education comprehension: verbalized understanding and needs further education  HOME EXERCISE PROGRAM: To be determined  GOALS: Goals reviewed with patient? Yes  SHORT TERM GOALS: Target date: N/A; Pt's spouse called later in the day and canceled future appointments and does not wish to pursue voice therapy at this time    ASSESSMENT:  CLINICAL IMPRESSION: Patient is a 77 y.o. female who was seen today for voice evaluation. Pt presents with hoarse/harsh/strained vocal quality which negatively impacts speech intelligibility. Her husband wondered if this was impacted by dental extractions from two years ago. She demonstrates mildly reduced breath support in sustained phonation tasks (10-16 seconds but with glottal fry). She denies pain with speech, has  COPD, uses an inhaler, reports shortness of breath, and running out of air with speech, but does not every "lose" her voice. Pt drinks 5-6 cups of coffee a day, 1 Pepsi, drinks very little water, has breathing treatments 2x/day, and sleeps in a recliner to due history of GERD. Pt was given printed information on vocal hygiene and short trial of vocal function exercises (max assist). Prognosis for improvement is fair given time post onset and other factors stated above. Pt initially wanted to come to therapy, but called later in the day and canceled the appointments.   OBJECTIVE IMPAIRMENTS: include voice disorder. These impairments are limiting patient from effectively communicating at home and in community. Factors affecting potential to achieve goals and functional outcome are ability to learn/carryover information, previous level of function, and severity of impairments. Patient will benefit from skilled SLP services to address above impairments and improve overall function.  REHAB POTENTIAL: Fair    PLAN:  SLP FREQUENCY: 1x/week  SLP DURATION: 6 weeks  PLANNED  INTERVENTIONS: SLP instruction and feedback, Patient/family education, and Re-evaluation   Thank you,  Havery Moros, CCC-SLP [phone]  Belem Hintze, CCC-SLP 09/04/2022, 11:19 AM

## 2022-09-16 ENCOUNTER — Ambulatory Visit (HOSPITAL_COMMUNITY): Payer: PPO | Admitting: Speech Pathology

## 2022-09-23 ENCOUNTER — Ambulatory Visit (HOSPITAL_COMMUNITY): Payer: PPO | Admitting: Speech Pathology

## 2022-09-30 ENCOUNTER — Ambulatory Visit (HOSPITAL_COMMUNITY): Payer: PPO | Admitting: Speech Pathology

## 2022-10-02 ENCOUNTER — Observation Stay (HOSPITAL_COMMUNITY)
Admission: EM | Admit: 2022-10-02 | Discharge: 2022-10-03 | Disposition: A | Discharge status: Home Health | Payer: PPO | Attending: Family Medicine | Admitting: Family Medicine

## 2022-10-02 ENCOUNTER — Encounter (HOSPITAL_COMMUNITY): Payer: Self-pay | Admitting: *Deleted

## 2022-10-02 ENCOUNTER — Other Ambulatory Visit: Payer: Self-pay

## 2022-10-02 ENCOUNTER — Emergency Department (HOSPITAL_COMMUNITY): Payer: PPO

## 2022-10-02 DIAGNOSIS — J9601 Acute respiratory failure with hypoxia: Secondary | ICD-10-CM | POA: Diagnosis present

## 2022-10-02 DIAGNOSIS — J449 Chronic obstructive pulmonary disease, unspecified: Secondary | ICD-10-CM | POA: Diagnosis not present

## 2022-10-02 DIAGNOSIS — I10 Essential (primary) hypertension: Secondary | ICD-10-CM | POA: Diagnosis present

## 2022-10-02 DIAGNOSIS — I2510 Atherosclerotic heart disease of native coronary artery without angina pectoris: Secondary | ICD-10-CM | POA: Diagnosis not present

## 2022-10-02 DIAGNOSIS — J189 Pneumonia, unspecified organism: Secondary | ICD-10-CM | POA: Diagnosis not present

## 2022-10-02 DIAGNOSIS — Z87891 Personal history of nicotine dependence: Secondary | ICD-10-CM | POA: Insufficient documentation

## 2022-10-02 DIAGNOSIS — L89151 Pressure ulcer of sacral region, stage 1: Secondary | ICD-10-CM

## 2022-10-02 DIAGNOSIS — Z79899 Other long term (current) drug therapy: Secondary | ICD-10-CM | POA: Diagnosis not present

## 2022-10-02 DIAGNOSIS — L89159 Pressure ulcer of sacral region, unspecified stage: Secondary | ICD-10-CM | POA: Diagnosis not present

## 2022-10-02 DIAGNOSIS — I509 Heart failure, unspecified: Principal | ICD-10-CM

## 2022-10-02 DIAGNOSIS — F411 Generalized anxiety disorder: Secondary | ICD-10-CM | POA: Diagnosis present

## 2022-10-02 DIAGNOSIS — E876 Hypokalemia: Secondary | ICD-10-CM | POA: Diagnosis not present

## 2022-10-02 DIAGNOSIS — I110 Hypertensive heart disease with heart failure: Secondary | ICD-10-CM | POA: Diagnosis not present

## 2022-10-02 DIAGNOSIS — R0602 Shortness of breath: Secondary | ICD-10-CM | POA: Diagnosis present

## 2022-10-02 DIAGNOSIS — I5022 Chronic systolic (congestive) heart failure: Secondary | ICD-10-CM | POA: Diagnosis present

## 2022-10-02 DIAGNOSIS — R0902 Hypoxemia: Secondary | ICD-10-CM

## 2022-10-02 LAB — BASIC METABOLIC PANEL
Anion gap: 12 (ref 5–15)
BUN: 13 mg/dL (ref 8–23)
CO2: 30 mmol/L (ref 22–32)
Calcium: 8.3 mg/dL — ABNORMAL LOW (ref 8.9–10.3)
Chloride: 95 mmol/L — ABNORMAL LOW (ref 98–111)
Creatinine, Ser: 1.66 mg/dL — ABNORMAL HIGH (ref 0.44–1.00)
GFR, Estimated: 32 mL/min — ABNORMAL LOW (ref >60)
Glucose, Bld: 171 mg/dL — ABNORMAL HIGH (ref 70–99)
Potassium: 2.3 mmol/L — CL (ref 3.5–5.1)
Sodium: 137 mmol/L (ref 135–145)

## 2022-10-02 LAB — CBC
HCT: 34.4 % — ABNORMAL LOW (ref 36.0–46.0)
Hemoglobin: 11.4 g/dL — ABNORMAL LOW (ref 12.0–15.0)
MCH: 28.8 pg (ref 26.0–34.0)
MCHC: 33.1 g/dL (ref 30.0–36.0)
MCV: 86.9 fL (ref 80.0–100.0)
Platelets: 241 10*3/uL (ref 150–400)
RBC: 3.96 MIL/uL (ref 3.87–5.11)
RDW: 13.8 % (ref 11.5–15.5)
WBC: 8 10*3/uL (ref 4.0–10.5)
nRBC: 0 % (ref 0.0–0.2)

## 2022-10-02 LAB — MAGNESIUM: Magnesium: 1.1 mg/dL — ABNORMAL LOW (ref 1.7–2.4)

## 2022-10-02 LAB — BRAIN NATRIURETIC PEPTIDE: B Natriuretic Peptide: 60 pg/mL (ref 0.0–100.0)

## 2022-10-02 MED ORDER — MAGNESIUM SULFATE 2 GM/50ML IV SOLN
2.0000 g | Freq: Once | INTRAVENOUS | Status: AC
  Administered 2022-10-02: 2 g via INTRAVENOUS
  Filled 2022-10-02: qty 50

## 2022-10-02 MED ORDER — SODIUM CHLORIDE 0.9 % IV SOLN
100.0000 mg | Freq: Two times a day (BID) | INTRAVENOUS | Status: DC
  Administered 2022-10-02 – 2022-10-03 (×2): 100 mg via INTRAVENOUS
  Filled 2022-10-02 (×6): qty 100

## 2022-10-02 MED ORDER — POLYETHYLENE GLYCOL 3350 17 G PO PACK: 17.0000 g | PACK | Freq: Every day | ORAL | Status: DC | PRN

## 2022-10-02 MED ORDER — FLUTICASONE FUROATE-VILANTEROL 100-25 MCG/ACT IN AEPB
1.0000 | INHALATION_SPRAY | Freq: Every day | RESPIRATORY_TRACT | Status: DC
  Administered 2022-10-03: 1 via RESPIRATORY_TRACT
  Filled 2022-10-02 (×2): qty 28

## 2022-10-02 MED ORDER — VENLAFAXINE HCL ER 75 MG PO CP24
75.0000 mg | ORAL_CAPSULE | Freq: Every day | ORAL | Status: DC
  Administered 2022-10-03: 75 mg via ORAL
  Filled 2022-10-02: qty 1

## 2022-10-02 MED ORDER — SODIUM CHLORIDE 0.9 % IV SOLN
2.0000 g | INTRAVENOUS | Status: DC
  Administered 2022-10-02: 2 g via INTRAVENOUS
  Filled 2022-10-02: qty 20

## 2022-10-02 MED ORDER — VENLAFAXINE HCL ER 75 MG PO CP24
150.0000 mg | ORAL_CAPSULE | Freq: Every day | ORAL | Status: DC
  Administered 2022-10-02: 150 mg via ORAL
  Filled 2022-10-02: qty 2

## 2022-10-02 MED ORDER — ENOXAPARIN SODIUM 30 MG/0.3ML IJ SOSY
30.0000 mg | PREFILLED_SYRINGE | INTRAMUSCULAR | Status: DC
  Administered 2022-10-02: 30 mg via SUBCUTANEOUS
  Filled 2022-10-02: qty 0.3

## 2022-10-02 MED ORDER — ROSUVASTATIN CALCIUM 20 MG PO TABS
20.0000 mg | ORAL_TABLET | Freq: Every day | ORAL | Status: DC
  Administered 2022-10-02 – 2022-10-03 (×2): 20 mg via ORAL
  Filled 2022-10-02 (×2): qty 1

## 2022-10-02 MED ORDER — ALPRAZOLAM 0.5 MG PO TABS: 0.5000 mg | ORAL_TABLET | Freq: Three times a day (TID) | ORAL | Status: DC | PRN

## 2022-10-02 MED ORDER — POTASSIUM CHLORIDE CRYS ER 20 MEQ PO TBCR
20.0000 meq | EXTENDED_RELEASE_TABLET | Freq: Once | ORAL | Status: AC
  Administered 2022-10-02: 20 meq via ORAL
  Filled 2022-10-02: qty 1

## 2022-10-02 MED ORDER — ACETAMINOPHEN 325 MG PO TABS: 650.0000 mg | ORAL_TABLET | Freq: Four times a day (QID) | ORAL | Status: DC | PRN

## 2022-10-02 MED ORDER — UMECLIDINIUM BROMIDE 62.5 MCG/ACT IN AEPB
1.0000 | INHALATION_SPRAY | Freq: Every day | RESPIRATORY_TRACT | Status: DC
  Administered 2022-10-03: 1 via RESPIRATORY_TRACT
  Filled 2022-10-02 (×2): qty 7

## 2022-10-02 MED ORDER — POTASSIUM CHLORIDE CRYS ER 20 MEQ PO TBCR
40.0000 meq | EXTENDED_RELEASE_TABLET | ORAL | Status: AC
  Administered 2022-10-02 (×2): 40 meq via ORAL
  Filled 2022-10-02 (×2): qty 2

## 2022-10-02 MED ORDER — FUROSEMIDE 10 MG/ML IJ SOLN
40.0000 mg | Freq: Once | INTRAMUSCULAR | Status: AC
  Administered 2022-10-02: 40 mg via INTRAVENOUS
  Filled 2022-10-02: qty 4

## 2022-10-02 MED ORDER — POTASSIUM CHLORIDE 10 MEQ/100ML IV SOLN
10.0000 meq | INTRAVENOUS | Status: AC
  Administered 2022-10-02 (×5): 10 meq via INTRAVENOUS
  Filled 2022-10-02 (×5): qty 100

## 2022-10-02 MED ORDER — RISPERIDONE 1 MG PO TABS
1.0000 mg | ORAL_TABLET | Freq: Every day | ORAL | Status: DC
  Administered 2022-10-02: 1 mg via ORAL
  Filled 2022-10-02: qty 1

## 2022-10-02 MED ORDER — BUDESON-GLYCOPYRROL-FORMOTEROL 160-9-4.8 MCG/ACT IN AERO: 2.0000 | INHALATION_SPRAY | Freq: Two times a day (BID) | RESPIRATORY_TRACT | Status: DC

## 2022-10-02 MED ORDER — FUROSEMIDE 10 MG/ML IJ SOLN
40.0000 mg | Freq: Once | INTRAMUSCULAR | Status: AC
  Administered 2022-10-03: 40 mg via INTRAVENOUS
  Filled 2022-10-02: qty 4

## 2022-10-02 MED ORDER — ONDANSETRON HCL 4 MG PO TABS: 4.0000 mg | ORAL_TABLET | Freq: Four times a day (QID) | ORAL | Status: DC | PRN

## 2022-10-02 MED ORDER — ACETAMINOPHEN 650 MG RE SUPP: 650.0000 mg | Freq: Four times a day (QID) | RECTAL | Status: DC | PRN

## 2022-10-02 MED ORDER — ONDANSETRON HCL 4 MG/2ML IJ SOLN: 4.0000 mg | Freq: Four times a day (QID) | INTRAMUSCULAR | Status: DC | PRN

## 2022-10-02 NOTE — Assessment & Plan Note (Signed)
Appears stable.  Quit smoking cigarettes 2 yrs ago. -DuoNebs as needed

## 2022-10-02 NOTE — Assessment & Plan Note (Signed)
Blood pressure soft- systolic 93-117. -Holding metoprolol, Entresto, spironolactone for now, resume when able

## 2022-10-02 NOTE — Assessment & Plan Note (Addendum)
Confirmed medications with daughter at bedside - resume risperidone, venlafaxine, as needed Xanax

## 2022-10-02 NOTE — H&P (Signed)
History and Physical    Melinda Gomez ZOX:[id number] DOB: [birth year] DOA: 10/02/2022  PCP: Kirby Funk, MD (Inactive)   Patient coming from: Home  I have personally briefly reviewed patient's old medical records in [facility]  Chief Complaint: Difficulty Breathing, Cough  HPI: Melinda Gomez is a 77 y.o. female with medical history significant for systolic CHF, hypertension, COPD, coronary artery disease. Patient presented to the ED with complaints of 1 week of difficulty breathing, with dry cough.  No chest pain.  Lower extremity swelling, but family reports abdomen getting bigger over the past several months.  She has not been able to check her weight regularly in a long while she has poor balance and is unable to stand.  Family reports weight gain, over the past 6 months, baseline weight about 150, reports she is now about 160s.  She has been compliant with her Lasix 20 mg daily.  No fevers no chills.  Not on home O2.  Quit smoking cigarettes 2 years ago. Spouse also reports sacral ulcer over the past 2 weeks it has improved, she required 2 courses of antibiotics last dose was about 4 days ago. ??Cephalexin, Daughter at bedside not sure.  EMS reports O2 sat of 78%.  ED Course: Tmax 97.7.  Heart rate 70s to 90s.  Respiratory rate 15-28.  Blood pressure systolic 90s to 116.  O2 sats down to 88% on room air.  BNP normal- 60.  Chest X-ray with small left pleural effusion, atelectasis versus infection. Potassium 2.3. IV Lasix 40 mg x 1 given.   Review of Systems: As per HPI all other systems reviewed and negative.  Past Medical History:  Diagnosis Date   CHF (congestive heart failure) (HCC)    COPD (chronic obstructive pulmonary disease) (HCC)    Coronary artery disease    GAD (generalized anxiety disorder)    GERD (gastroesophageal reflux disease)    Hypercholesteremia    Hypertension     Past Surgical History:  Procedure Laterality Date   ABDOMINAL HYSTERECTOMY      BLADDER SURGERY     RIGHT/LEFT HEART CATH AND CORONARY ANGIOGRAPHY N/A 05/01/2021   Procedure: RIGHT/LEFT HEART CATH AND CORONARY ANGIOGRAPHY;  Surgeon: Swaziland, Peter M, MD;  Location: [facility] INVASIVE CV LAB;  Service: Cardiovascular;  Laterality: N/A;     reports that she quit smoking about 19 months ago. Her smoking use included cigarettes. She has a 27.50 pack-year smoking history. She has never used smokeless tobacco. She reports that she does not drink alcohol and does not use drugs.  Allergies  Allergen Reactions   Hydrochlorothiazide     Other reaction(s): hyponatremia   Lisinopril     Other reaction(s): cough   Norpramin [Desipramine] Other (See Comments)    Jittery   Other Other (See Comments)    Estratest  depression & palpitation   Talwin [Pentazocine] Other (See Comments)    hallucinations   Verapamil     Other reaction(s): cough    Family History  Problem Relation Age of Onset   Stroke Mother     Prior to Admission medications   Medication Sig Start Date End Date Taking? Authorizing Provider  albuterol (VENTOLIN HFA) 108 (90 Base) MCG/ACT inhaler Inhale 2 puffs into the lungs every 4 (four) hours as needed for shortness of breath or wheezing. 09/03/22   Luciano Cutter, MD  ALPRAZolam Prudy Feeler) 0.5 MG tablet TAKE ONE TABLET BY MOUTH 3 TIMES DAILY AS NEEDED FOR ANXIETY. 07/15/22   Cottle,  Steva Ready., MD  Budeson-Glycopyrrol-Formoterol (BREZTRI AEROSPHERE) 160-9-4.8 MCG/ACT AERO Inhale 2 puffs into the lungs in the morning and at bedtime. 09/03/22   Luciano Cutter, MD  Cholecalciferol (VITAMIN D3) 125 MCG (5000 UT) TABS Take 5,000 Units by mouth daily.    [provider]  cholestyramine Lanetta Inch) 4 GM/DOSE powder Take by mouth See admin instructions. 13 grams added to 2-6 oz of water as needed    [provider]  Coenzyme Q10 150 MG CAPS Take 1 capsule by mouth daily.    [provider]  cyclobenzaprine (FLEXERIL) 10 MG tablet Take 10 mg by mouth  as needed for muscle spasms.    [provider]  dapagliflozin propanediol (FARXIGA) 10 MG TABS tablet Take 1 tablet (10 mg total) by mouth daily before breakfast. 05/27/22   O'Neal, Ronnald Ramp, MD  diphenhydrAMINE (BENADRYL) 25 mg capsule Take 25 mg by mouth every 6 (six) hours as needed for allergies.    [provider]  Fluticasone-Umeclidin-Vilant (TRELEGY ELLIPTA) 100-62.5-25 MCG/ACT AEPB Inhale 1 puff into the lungs daily as needed (Shortness of breath).    [provider]  furosemide (LASIX) 40 MG tablet Take 20 mg by mouth daily. 04/20/21   [provider]  metoprolol succinate (TOPROL-XL) 50 MG 24 hr tablet Take 1 tablet (50 mg total) by mouth daily for heart and blood pressure. Take with or immediately following a meal. 11/26/21   O'Neal, Ronnald Ramp, MD  OVER THE COUNTER MEDICATION Take 3 tablets by mouth daily. Balance of Nature Fruit and Vegetables    [provider]  promethazine (PHENERGAN) 12.5 MG tablet Take 12.5 mg by mouth every 6 (six) hours as needed for nausea.    [provider]  risperiDONE (RISPERDAL) 1 MG tablet Take 1 tablet (1 mg total) by mouth at bedtime. 07/15/22   Cottle, Steva Ready., MD  rosuvastatin (CRESTOR) 20 MG tablet Take 20 mg by mouth daily.    [provider]  sacubitril-valsartan (ENTRESTO) 49-51 MG Take 1 tablet by mouth 2 (two) times daily. 06/04/22   Sande Rives, MD  spironolactone (ALDACTONE) 25 MG tablet TAKE 1/2 TABLET BY MOUTH DAILY FOR FLUID. 04/18/22   O'Neal, Ronnald Ramp, MD  traMADol (ULTRAM) 50 MG tablet Take 50 mg by mouth daily as needed (migraine). 02/01/21   [provider]  traZODone (DESYREL) 100 MG tablet TAKE 1 TO2 TABLETS AT BEDTIME AS NEEDED FOR SLEEP. USE THE LOWEST DOSE THAT WORKS. 07/15/22   Cottle, Steva Ready., MD  venlafaxine XR (EFFEXOR-XR) 75 MG 24 hr capsule 1 capsule in the morning and 2 capsules in the evening 07/15/22   Cottle, Steva Ready., MD     Physical Exam: Vitals:   10/02/22 1615 10/02/22 1700 10/02/22 1707 10/02/22 1719  BP: 114/60 110/64    Pulse: 82 86 84 84  Resp: (!) 26  (!) 24 (!) 23  Temp:      TempSrc:      SpO2: 96% 96% 94% 96%  Weight:      Height:        Constitutional: NAD, calm, comfortable Vitals:   10/02/22 1615 10/02/22 1700 10/02/22 1707 10/02/22 1719  BP: 114/60 110/64    Pulse: 82 86 84 84  Resp: (!) 26  (!) 24 (!) 23  Temp:      TempSrc:      SpO2: 96% 96% 94% 96%  Weight:      Height:  Eyes: PERRL, lids and conjunctivae normal ENMT: Mucous membranes are moist.  Neck: normal, supple, no masses, no thyromegaly Respiratory: Crackles bilateral lung bases, no rhonchi or wheezing.  Normal respiratory effort, no accessory muscle use Cardiovascular: Regular rate and rhythm, no murmurs / rubs / gallops. No extremity edema.  Extremities warm Abdomen: Mild to moderately full/distended, but soft, no tenderness, no masses palpated.  Musculoskeletal: no clubbing / cyanosis. No joint deformity upper and lower extremities.  Skin: Stage I sacral decubitus ulcer, diffuse area of redness without warmth fluctuance discharge or open wound, no induration Neurologic: No apparent cranial nerve abnormalities, moving extremities spontaneously Psychiatric: Normal judgment and insight. Alert and oriented x 3. Normal mood.     Labs on Admission: I have personally reviewed following labs and imaging studies  CBC: Recent Labs  Lab 10/02/22 1453  WBC 8.0  HGB 11.4*  HCT 34.4*  MCV 86.9  PLT 241   Basic Metabolic Panel: Recent Labs  Lab 10/02/22 1453  NA 137  K 2.3*  CL 95*  CO2 30  GLUCOSE 171*  BUN 13  CREATININE 1.66*  CALCIUM 8.3*  MG 1.1*    Radiological Exams on Admission: DG Chest Portable 1 View  Result Date: 10/02/2022 CLINICAL DATA:  SOB EXAM: PORTABLE CHEST 1 VIEW COMPARISON:  February 22, 2021, December 30, 2020 FINDINGS: The cardiomediastinal silhouette is unchanged in contour.  Small LEFT pleural effusion. No pneumothorax. LEFT basilar mixed density opacity. Small RIGHT basilar nodular opacity, possibly corresponding to a similar appearing nodular opacity on prior chest CT in 2022. IMPRESSION: Small LEFT pleural effusion with adjacent opacities which could reflect atelectasis versus infection. Followup PA and lateral chest X-ray is recommended in 3-4 weeks to ensure resolution and exclude underlying malignancy. Electronically Signed   By: Meda Klinefelter M.D.   On: 10/02/2022 15:21    EKG: Independently reviewed.  Sinus rhythm, rate 88, QTc 489.  Nonspecific diffuse ST and T wave abnormalities.  Assessment/Plan Principal Problem:   Acute hypoxic respiratory failure (HCC) Active Problems:   Hypokalemia   CAP (community acquired pneumonia)   Hypertension   COPD (chronic obstructive pulmonary disease) (HCC)   GAD (generalized anxiety disorder)   Chronic systolic CHF (congestive heart failure) (HCC)   Assessment and Plan: * Acute hypoxic respiratory failure (HCC) O2 sats 88% on room air.  Not on home O2.  Chest x-ray suggesting pneumonia versus atelectasis, shows small left pleural effusion.  BNP unremarkable at 60. -Antibiotics  CAP (community acquired pneumonia) Presenting with dyspnea, dry cough, mild hypoxia.  Chest x-ray - Small LEFT pleural effusion with adjacent opacities which could reflect atelectasis versus infection.  Treating as pneumonia.  Rules out for sepsis.  Afebrile, WBC 8. -IV ceftriaxone and doxycycline -Mucolytics as needed  Chronic systolic CHF (congestive heart failure) (HCC) BNP unremarkable at 60, no lower extremity swelling., reports abdominal distention, not quite impressive on exam.  Chest x-ray with small left pleural effusion.  Per chart with baseline weight 150s today she is 166.   Reports compliance with Lasix 20 mg daily.  04/2022 shows improved EF of 50 to 55%, from prior low EF of 25 to 30% in 2022. - IV Lasix 40 mg x 2, further  dosing pending clinical course -Check input output, daily weight, daily BMP  Hypokalemia Potassium 2.3.  Magnesium 1.1. -Replete  Sacral decubitus ulcer Appears to have improved.  Currently stage I with diffuse redness without signs of infection.  Per spouse completed 2 courses of antibiotics.  GAD (  generalized anxiety disorder) Confirmed medications with daughter at bedside - resume risperidone, venlafaxine, as needed Xanax  Hypertension Blood pressure soft- systolic 93-117. -Holding metoprolol, Entresto, spironolactone for now, resume when able   COPD (chronic obstructive pulmonary disease) (HCC) Appears stable.  Quit smoking cigarettes 2 yrs ago. -DuoNebs as needed   DVT prophylaxis: Lovenox Code Status: DNR-confirmed with daughter and spouse at bedside.  ACP documents reviewed. Family Communication: Daughter and spouse at bedside. Disposition Plan: ~ 2days Consults called: None  Admission status: Obs Tele I certify that at the point of admission it is my clinical judgment that the patient will require inpatient hospital care spanning beyond 2 midnights from the point of admission due to high intensity of service, high risk for further deterioration and high frequency of surveillance required.   Author: Onnie Boer, MD 10/02/2022 6:16 PM  For on call review [email or web address].

## 2022-10-02 NOTE — Assessment & Plan Note (Signed)
BNP unremarkable at 60, no lower extremity swelling., reports abdominal distention, not quite impressive on exam.  Chest x-ray with small left pleural effusion.  Per chart with baseline weight 150s today she is 166.   Reports compliance with Lasix 20 mg daily.  04/2022 shows improved EF of 50 to 55%, from prior low EF of 25 to 30% in 2022. - IV Lasix 40 mg x 2, further dosing pending clinical course -Check input output, daily weight, daily BMP

## 2022-10-02 NOTE — Assessment & Plan Note (Signed)
Potassium 2.3.  Magnesium 1.1. -Replete

## 2022-10-02 NOTE — Assessment & Plan Note (Signed)
Presenting with dyspnea, dry cough, mild hypoxia.  Chest x-ray - Small LEFT pleural effusion with adjacent opacities which could reflect atelectasis versus infection.  Treating as pneumonia.  Rules out for sepsis.  Afebrile, WBC 8. -IV ceftriaxone and doxycycline -Mucolytics as needed

## 2022-10-02 NOTE — ED Provider Notes (Signed)
[place] EMERGENCY DEPARTMENT AT [facility] Provider Note   CSN: [id number] Arrival date & time: 10/02/22  1410     History  Chief Complaint  Patient presents with   Shortness of Breath    Melinda Gomez is a 77 y.o. female with a history including CHF, COPD, GERD, hypertension, CAD presenting for evaluation of increased shortness of breath and hypoxia.  She reports feeling increasingly short of breath for at least the past several days, today became increasingly severe.  She had a new home health nurse visit her this morning who quickly realized she had an oxygen saturation at 78% and her blood pressure was low with a systolic around 100.  EMS placed her on 15 L nonrebreather, after arriving here she was switched to nasal cannula 4 L and remains above 95%.  She denies chest pain, cough, fevers or chills, she does endorse increased abdominal fullness, orthopnea.  She is on furosemide and spironolactone and has been compliant with these medications.  She denies wheezing.  She does not use oxygen at baseline.  The history is provided by the patient and the spouse.       Home Medications Prior to Admission medications   Medication Sig Start Date End Date Taking? Authorizing Provider  albuterol (VENTOLIN HFA) 108 (90 Base) MCG/ACT inhaler Inhale 2 puffs into the lungs every 4 (four) hours as needed for shortness of breath or wheezing. 09/03/22   Luciano Cutter, MD  ALPRAZolam Prudy Feeler) 0.5 MG tablet TAKE ONE TABLET BY MOUTH 3 TIMES DAILY AS NEEDED FOR ANXIETY. 07/15/22   Cottle, Steva Ready., MD  Budeson-Glycopyrrol-Formoterol (BREZTRI AEROSPHERE) 160-9-4.8 MCG/ACT AERO Inhale 2 puffs into the lungs in the morning and at bedtime. 09/03/22   Luciano Cutter, MD  Cholecalciferol (VITAMIN D3) 125 MCG (5000 UT) TABS Take 5,000 Units by mouth daily.    [provider]  cholestyramine Lanetta Inch) 4 GM/DOSE powder Take by mouth See admin instructions. 13 grams added to 2-6 oz  of water as needed    [provider]  Coenzyme Q10 150 MG CAPS Take 1 capsule by mouth daily.    [provider]  cyclobenzaprine (FLEXERIL) 10 MG tablet Take 10 mg by mouth as needed for muscle spasms.    [provider]  dapagliflozin propanediol (FARXIGA) 10 MG TABS tablet Take 1 tablet (10 mg total) by mouth daily before breakfast. 05/27/22   O'Neal, Ronnald Ramp, MD  diphenhydrAMINE (BENADRYL) 25 mg capsule Take 25 mg by mouth every 6 (six) hours as needed for allergies.    [provider]  Fluticasone-Umeclidin-Vilant (TRELEGY ELLIPTA) 100-62.5-25 MCG/ACT AEPB Inhale 1 puff into the lungs daily as needed (Shortness of breath).    [provider]  furosemide (LASIX) 40 MG tablet Take 20 mg by mouth daily. 04/20/21   [provider]  metoprolol succinate (TOPROL-XL) 50 MG 24 hr tablet Take 1 tablet (50 mg total) by mouth daily for heart and blood pressure. Take with or immediately following a meal. 11/26/21   O'Neal, Ronnald Ramp, MD  OVER THE COUNTER MEDICATION Take 3 tablets by mouth daily. Balance of Nature Fruit and Vegetables    [provider]  promethazine (PHENERGAN) 12.5 MG tablet Take 12.5 mg by mouth every 6 (six) hours as needed for nausea.    [provider]  risperiDONE (RISPERDAL) 1 MG tablet Take 1 tablet (1 mg total) by mouth at bedtime. 07/15/22   Cottle, Steva Ready., MD  rosuvastatin (  CRESTOR) 20 MG tablet Take 20 mg by mouth daily.    [provider]  sacubitril-valsartan (ENTRESTO) 49-51 MG Take 1 tablet by mouth 2 (two) times daily. 06/04/22   Sande Rives, MD  spironolactone (ALDACTONE) 25 MG tablet TAKE 1/2 TABLET BY MOUTH DAILY FOR FLUID. 04/18/22   O'Neal, Ronnald Ramp, MD  traMADol (ULTRAM) 50 MG tablet Take 50 mg by mouth daily as needed (migraine). 02/01/21   [provider]  traZODone (DESYREL) 100 MG tablet TAKE 1 TO2 TABLETS AT BEDTIME AS NEEDED FOR SLEEP. USE THE LOWEST  DOSE THAT WORKS. 07/15/22   Cottle, Steva Ready., MD  venlafaxine XR (EFFEXOR-XR) 75 MG 24 hr capsule 1 capsule in the morning and 2 capsules in the evening 07/15/22   Cottle, Steva Ready., MD      Allergies    Hydrochlorothiazide, Lisinopril, Norpramin [desipramine], Other, Talwin [pentazocine], and Verapamil    Review of Systems   Review of Systems  Constitutional:  Negative for chills and fever.  HENT:  Negative for congestion.   Eyes: Negative.   Respiratory:  Positive for shortness of breath. Negative for cough, choking, chest tightness and wheezing.   Cardiovascular:  Negative for chest pain.  Gastrointestinal:  Positive for abdominal distention. Negative for abdominal pain, nausea and vomiting.  Genitourinary: Negative.   Musculoskeletal:  Negative for arthralgias, joint swelling and neck pain.  Skin: Negative.  Negative for rash and wound.  Neurological:  Negative for dizziness, weakness, light-headedness, numbness and headaches.  Psychiatric/Behavioral: Negative.      Physical Exam Updated Vital Signs BP (!) 117/53   Pulse 83 Comment: [place] 2 L/M  Temp 97.6 F (36.4 C) (Oral)   Resp (!) 24 Comment: [place] 2 L/M  Ht 5\' 3"  (1.6 m)   Wt 75.3 kg   SpO2 95% Comment:  2 L/M  BMI 29.41 kg/m  Physical Exam Vitals and nursing note reviewed.  Constitutional:      Appearance: She is well-developed.  HENT:     Head: Normocephalic and atraumatic.  Eyes:     Conjunctiva/sclera: Conjunctivae normal.  Cardiovascular:     Rate and Rhythm: Normal rate and regular rhythm.     Heart sounds: Normal heart sounds.  Pulmonary:     Effort: Pulmonary effort is normal.     Breath sounds: Examination of the right-middle field reveals rhonchi. Examination of the left-middle field reveals rhonchi. Examination of the right-lower field reveals decreased breath sounds. Examination of the left-lower field reveals decreased breath sounds. Decreased breath sounds and rhonchi present. No wheezing.   Abdominal:     General: Bowel sounds are normal.     Palpations: Abdomen is soft.     Tenderness: There is no abdominal tenderness.  Musculoskeletal:        General: Normal range of motion.     Cervical back: Normal range of motion.     Comments: Minimal trace bilateral ankle edema.  Skin:    General: Skin is warm and dry.  Neurological:     General: No focal deficit present.     Mental Status: She is alert.     ED Results / Procedures / Treatments   Labs (all labs ordered are listed, but only abnormal results are displayed) Labs Reviewed  BASIC METABOLIC PANEL - Abnormal; Notable for the following components:      Result Value   Potassium 2.3 (*)    Chloride 95 (*)    Glucose, Bld 171 (*)  Creatinine, Ser 1.66 (*)    Calcium 8.3 (*)    GFR, Estimated 32 (*)    All other components within normal limits  CBC - Abnormal; Notable for the following components:   Hemoglobin 11.4 (*)    HCT 34.4 (*)    All other components within normal limits  BRAIN NATRIURETIC PEPTIDE  MAGNESIUM    EKG None  Radiology DG Chest Portable 1 View  Result Date: 10/02/2022 CLINICAL DATA:  SOB EXAM: PORTABLE CHEST 1 VIEW COMPARISON:  February 22, 2021, December 30, 2020 FINDINGS: The cardiomediastinal silhouette is unchanged in contour. Small LEFT pleural effusion. No pneumothorax. LEFT basilar mixed density opacity. Small RIGHT basilar nodular opacity, possibly corresponding to a similar appearing nodular opacity on prior chest CT in 2022. IMPRESSION: Small LEFT pleural effusion with adjacent opacities which could reflect atelectasis versus infection. Followup PA and lateral chest X-ray is recommended in 3-4 weeks to ensure resolution and exclude underlying malignancy. Electronically Signed   By: Meda Klinefelter M.D.   On: 10/02/2022 15:21    Procedures Procedures    Medications Ordered in ED Medications  potassium chloride 10 mEq in 100 mL IVPB (10 mEq Intravenous New Bag/Given 10/02/22  1552)  potassium chloride SA (KLOR-CON M) CR tablet 20 mEq (20 mEq Oral Given 10/02/22 1540)  furosemide (LASIX) injection 40 mg (40 mg Intravenous Given 10/02/22 1548)    ED Course/ Medical Decision Making/ A&P                             Medical Decision Making Patient presenting with significant hypoxia prior to arrival, maintaining oxygen saturations of 95% on 4 L nasal cannula.  She has a history of CHF, also has COPD, clinically her exam is most suggestive of CHF.  She has been compliant with her diuretics.  No wheezing on exam.  She is given IV Lasix here, labs are significant for a potassium of 2.3, magnesium level is currently pending.  Patient will require admission, IV Lasix 10 mEq x 5 runs have been ordered.  Amount and/or Complexity of Data Reviewed Labs: ordered.    Details: Labs significant for potassium of 2.3, she has a normal BUN at 13, her creatinine is 1.66, magnesium level is currently pending, she has a normal WBC count at 8.0.  Her BNP is normal at 60,  Radiology: ordered.    Details: Pleural effusion. Discussion of management or test interpretation with external provider(s): Pt discussed with Dr. Mariea Clonts who will see pt.   Risk Decision regarding hospitalization. Risk Details: Call placed to hospitalist for admission           Final Clinical Impression(s) / ED Diagnoses Final diagnoses:  Congestive heart failure, unspecified HF chronicity, unspecified heart failure type (HCC)  Hypokalemia  Hypoxia    Rx / DC Orders ED Discharge Orders     None         Victoriano Lain 10/02/22 1614    Kommor, Hadar, MD 10/02/22 1738

## 2022-10-02 NOTE — Assessment & Plan Note (Signed)
O2 sats 88% on room air.  Not on home O2.  Chest x-ray suggesting pneumonia versus atelectasis, shows small left pleural effusion.  BNP unremarkable at 60. -Antibiotics

## 2022-10-02 NOTE — ED Triage Notes (Signed)
Pt BIB [facility] for low RA saturation at 78%, pt is not on home O2.  Fire arrived and placed pt on NRB 15L,  ems removed and 90% on RA, Olivia applied at 4L/M with sats at 95%.  Pt with hx of CHF and COPD.  Pt denies CP or pain, but is SOB

## 2022-10-02 NOTE — ED Notes (Signed)
EDP reports pt's sats dropped to 88% on RA, O2 placed back on

## 2022-10-02 NOTE — Assessment & Plan Note (Signed)
Appears to have improved.  Currently stage I with diffuse redness without signs of infection.  Per spouse completed 2 courses of antibiotics.

## 2022-10-02 NOTE — ED Notes (Signed)
ED Provider at bedside. 

## 2022-10-03 DIAGNOSIS — J9601 Acute respiratory failure with hypoxia: Secondary | ICD-10-CM | POA: Diagnosis not present

## 2022-10-03 LAB — BASIC METABOLIC PANEL
Anion gap: 6 (ref 5–15)
BUN: 13 mg/dL (ref 8–23)
CO2: 29 mmol/L (ref 22–32)
Calcium: 7.6 mg/dL — ABNORMAL LOW (ref 8.9–10.3)
Chloride: 103 mmol/L (ref 98–111)
Creatinine, Ser: 1.47 mg/dL — ABNORMAL HIGH (ref 0.44–1.00)
GFR, Estimated: 37 mL/min — ABNORMAL LOW (ref >60)
Glucose, Bld: 135 mg/dL — ABNORMAL HIGH (ref 70–99)
Potassium: 3.4 mmol/L — ABNORMAL LOW (ref 3.5–5.1)
Sodium: 138 mmol/L (ref 135–145)

## 2022-10-03 LAB — MAGNESIUM: Magnesium: 1.7 mg/dL (ref 1.7–2.4)

## 2022-10-03 LAB — CBC
HCT: 30.7 % — ABNORMAL LOW (ref 36.0–46.0)
Hemoglobin: 10.1 g/dL — ABNORMAL LOW (ref 12.0–15.0)
MCH: 29.3 pg (ref 26.0–34.0)
MCHC: 32.9 g/dL (ref 30.0–36.0)
MCV: 89 fL (ref 80.0–100.0)
Platelets: 246 10*3/uL (ref 150–400)
RBC: 3.45 MIL/uL — ABNORMAL LOW (ref 3.87–5.11)
RDW: 14.1 % (ref 11.5–15.5)
WBC: 6.5 10*3/uL (ref 4.0–10.5)
nRBC: 0 % (ref 0.0–0.2)

## 2022-10-03 MED ORDER — ACETAMINOPHEN 325 MG PO TABS: 650.0000 mg | ORAL_TABLET | Freq: Four times a day (QID) | ORAL | Status: DC | PRN

## 2022-10-03 MED ORDER — DOXYCYCLINE HYCLATE 100 MG PO TABS: 100.0000 mg | ORAL_TABLET | Freq: Two times a day (BID) | ORAL | 0 refills | Status: AC

## 2022-10-03 MED ORDER — BREZTRI AEROSPHERE 160-9-4.8 MCG/ACT IN AERO: 2.0000 | INHALATION_SPRAY | Freq: Two times a day (BID) | RESPIRATORY_TRACT | 2 refills | Status: DC

## 2022-10-03 MED ORDER — ZINC OXIDE 40 % EX OINT
TOPICAL_OINTMENT | Freq: Three times a day (TID) | CUTANEOUS | Status: DC
  Filled 2022-10-03: qty 57

## 2022-10-03 MED ORDER — ENOXAPARIN SODIUM 40 MG/0.4ML IJ SOSY: 40.0000 mg | PREFILLED_SYRINGE | INTRAMUSCULAR | Status: DC

## 2022-10-03 MED ORDER — CEPHALEXIN 500 MG PO CAPS: 500.0000 mg | ORAL_CAPSULE | Freq: Three times a day (TID) | ORAL | 0 refills | Status: AC

## 2022-10-03 MED ORDER — DM-GUAIFENESIN ER 30-600 MG PO TB12
1.0000 | ORAL_TABLET | Freq: Two times a day (BID) | ORAL | Status: DC
  Administered 2022-10-03: 1 via ORAL
  Filled 2022-10-03: qty 1

## 2022-10-03 MED ORDER — ALBUTEROL SULFATE HFA 108 (90 BASE) MCG/ACT IN AERS: 2.0000 | INHALATION_SPRAY | RESPIRATORY_TRACT | 2 refills | Status: AC | PRN

## 2022-10-03 MED ORDER — PREDNISONE 20 MG PO TABS
50.0000 mg | ORAL_TABLET | Freq: Every day | ORAL | Status: DC
  Administered 2022-10-03: 50 mg via ORAL
  Filled 2022-10-03: qty 3

## 2022-10-03 MED ORDER — MAGNESIUM SULFATE 2 GM/50ML IV SOLN
2.0000 g | Freq: Once | INTRAVENOUS | Status: AC
  Administered 2022-10-03: 2 g via INTRAVENOUS
  Filled 2022-10-03: qty 50

## 2022-10-03 MED ORDER — PREDNISONE 50 MG PO TABS: 50.0000 mg | ORAL_TABLET | Freq: Every day | ORAL | 0 refills | Status: AC

## 2022-10-03 MED ORDER — DM-GUAIFENESIN ER 30-600 MG PO TB12: 1.0000 | ORAL_TABLET | Freq: Two times a day (BID) | ORAL | 0 refills | Status: AC

## 2022-10-03 MED ORDER — SODIUM CHLORIDE 0.9 % IV SOLN
2.0000 g | Freq: Once | INTRAVENOUS | Status: AC
  Administered 2022-10-03: 2 g via INTRAVENOUS
  Filled 2022-10-03: qty 20

## 2022-10-03 MED ORDER — TRELEGY ELLIPTA 100-62.5-25 MCG/ACT IN AEPB: 1.0000 | INHALATION_SPRAY | Freq: Every day | RESPIRATORY_TRACT | 3 refills | Status: DC

## 2022-10-03 NOTE — Progress Notes (Signed)
Went over d/c instructions w/ pt, daughter, and spouse.

## 2022-10-03 NOTE — Discharge Instructions (Signed)
1)Very low-salt diet advised 2)Weigh yourself daily, call if you gain more than 3 pounds in 1 day or more than 5 pounds in 1 week as your diuretic medications may need to be adjusted 3)Limit your Fluid  intake to no more than 60 ounces (1.8 Liters) per day 4)Avoid ibuprofen/Advil/Aleve/Motrin/Goody Powders/Naproxen/BC powders/Meloxicam/Diclofenac/Indomethacin and other Nonsteroidal anti-inflammatory medications as these will make you more likely to bleed and can cause stomach ulcers, can also cause Kidney problems. 5)Repeat CBC and BMP Blood Test within 1 week advised--- 6)Sacral wound care--- Clean sacrum and buttocks with soap and water, dry and apply a thin layer of Desitin 3 times a day and prn soiling.  WOULD LEAVE SILICONE FOAM OFF THIS AREA AND PLACE ON LOW AIR LOSS MATTRESS.

## 2022-10-03 NOTE — Progress Notes (Signed)
Transition of Care [facility]) - Inpatient Brief Assessment   Patient Details  Name: Melinda Gomez MRN: [id number] Date of Birth: 09/15/1945  Transition of Care [facility]) CM/SW Contact:    Annice Needy, LCSW Phone Number: 10/03/2022, 1:32 PM   Clinical Narrative: Patient active with [facility]. Will resume services at d/c.    Transition of Care Asessment: Insurance and Status: Insurance coverage has been reviewed Patient has primary care physician: Yes Home environment has been reviewed: yes Prior level of function:: HH services Prior/Current Home Services: Current home services ([facility]) Social Determinants of Health Reivew: SDOH reviewed no interventions necessary Readmission risk has been reviewed: Yes Transition of care needs: no transition of care needs at this time

## 2022-10-03 NOTE — Progress Notes (Signed)
[facility] Discharge Note  Admit Date:  10/02/2022 Discharge date: 10/03/2022   TORA PRUNTY to be D/C'd Home per MD order.  AVS completed. Patient/caregiver able to verbalize understanding.  Discharge Medication: Allergies as of 10/03/2022       Reactions   Hydrochlorothiazide    Other reaction(s): hyponatremia   Lisinopril    Other reaction(s): cough   Norpramin [desipramine] Other (See Comments)   Jittery   Other Other (See Comments)   Estratest  depression & palpitation   Talwin [pentazocine] Other (See Comments)   hallucinations   Verapamil    Other reaction(s): cough        Medication List     STOP taking these medications    Coenzyme Q10 150 MG Caps   OVER THE COUNTER MEDICATION       TAKE these medications    acetaminophen 325 MG tablet Commonly known as: TYLENOL Take 2 tablets (650 mg total) by mouth every 6 (six) hours as needed for mild pain (or Fever >/= 101).   albuterol 108 (90 Base) MCG/ACT inhaler Commonly known as: VENTOLIN HFA Inhale 2 puffs into the lungs every 4 (four) hours as needed for shortness of breath or wheezing.   ALPRAZolam 0.5 MG tablet Commonly known as: XANAX TAKE ONE TABLET BY MOUTH 3 TIMES DAILY AS NEEDED FOR ANXIETY. What changed:  how much to take how to take this when to take this reasons to take this additional instructions   Breztri Aerosphere 160-9-4.8 MCG/ACT Aero Generic drug: Budeson-Glycopyrrol-Formoterol Inhale 2 puffs into the lungs in the morning and at bedtime.   cephALEXin 500 MG capsule Commonly known as: Keflex Take 1 capsule (500 mg total) by mouth 3 (three) times daily for 5 days.   cholestyramine 4 GM/DOSE powder Commonly known as: QUESTRAN Take 4 g by mouth See admin instructions. 13 grams added to 2-6 oz of water as needed   cyclobenzaprine 10 MG tablet Commonly known as: FLEXERIL Take 10 mg by mouth as needed for muscle spasms.   dapagliflozin propanediol 10 MG Tabs tablet Commonly known  as: FARXIGA Take 1 tablet (10 mg total) by mouth daily before breakfast.   dextromethorphan-guaiFENesin 30-600 MG 12hr tablet Commonly known as: MUCINEX DM Take 1 tablet by mouth 2 (two) times daily for 10 days.   diphenhydrAMINE 25 mg capsule Commonly known as: BENADRYL Take 25 mg by mouth every 6 (six) hours as needed for allergies.   doxycycline 100 MG tablet Commonly known as: VIBRA-TABS Take 1 tablet (100 mg total) by mouth 2 (two) times daily for 5 days.   Entresto 49-51 MG Generic drug: sacubitril-valsartan Take 1 tablet by mouth 2 (two) times daily.   furosemide 40 MG tablet Commonly known as: LASIX Take 20 mg by mouth daily.   loratadine 10 MG tablet Commonly known as: CLARITIN Take 10 mg by mouth daily.   metoprolol succinate 50 MG 24 hr tablet Commonly known as: TOPROL-XL Take 1 tablet (50 mg total) by mouth daily for heart and blood pressure. Take with or immediately following a meal.   predniSONE 50 MG tablet Commonly known as: DELTASONE Take 1 tablet (50 mg total) by mouth daily with breakfast for 3 days. Start taking on: October 04, 2022   promethazine 12.5 MG tablet Commonly known as: PHENERGAN Take 12.5 mg by mouth every 6 (six) hours as needed for nausea.   risperiDONE 1 MG tablet Commonly known as: RISPERDAL Take 1 tablet (1 mg total) by mouth at bedtime.  rizatriptan 5 MG tablet Commonly known as: MAXALT Take 5 mg by mouth as needed for migraine.   rosuvastatin 20 MG tablet Commonly known as: CRESTOR Take 20 mg by mouth daily.   spironolactone 25 MG tablet Commonly known as: ALDACTONE TAKE 1/2 TABLET BY MOUTH DAILY FOR FLUID. What changed: See the new instructions.   traMADol 50 MG tablet Commonly known as: ULTRAM Take 50 mg by mouth daily as needed (migraine).   traZODone 100 MG tablet Commonly known as: DESYREL TAKE 1 TO2 TABLETS AT BEDTIME AS NEEDED FOR SLEEP. USE THE LOWEST DOSE THAT WORKS. What changed:  how much to take how to  take this when to take this additional instructions   Trelegy Ellipta 100-62.5-25 MCG/ACT Aepb Generic drug: Fluticasone-Umeclidin-Vilant Inhale 1 puff into the lungs daily. What changed:  when to take this reasons to take this   venlafaxine XR 75 MG 24 hr capsule Commonly known as: EFFEXOR-XR 1 capsule in the morning and 2 capsules in the evening What changed:  how much to take how to take this when to take this   Vitamin D3 125 MCG (5000 UT) Tabs Take 5,000 Units by mouth daily.               Discharge Care Instructions  (From admission, onward)           Start     Ordered   10/03/22 0000  Discharge wound care:       Comments: As above   10/03/22 1505            Discharge Assessment: Vitals:   10/03/22 0911 10/03/22 1328  BP:  105/66  Pulse:  93  Resp:  20  Temp:  98.1 F (36.7 C)  SpO2: 93% 99%   Skin clean, dry and intact without evidence of skin break down, no evidence of skin tears noted. IV catheter discontinued intact. Site without signs and symptoms of complications - no redness or edema noted at insertion site, patient denies c/o pain - only slight tenderness at site.  Dressing with slight pressure applied.  D/c Instructions-Education: Discharge instructions given to patient/family with verbalized understanding. D/c education completed with patient/family including follow up instructions, medication list, d/c activities limitations if indicated, with other d/c instructions as indicated by MD - patient able to verbalize understanding, all questions fully answered. Patient instructed to return to ED, call 911, or call MD for any changes in condition.  Patient escorted via WC, and D/C home via private auto.  Cristal Ford, LPN 1/61/0960 4:54 PM

## 2022-10-03 NOTE — Discharge Summary (Signed)
Melinda Gomez, is a 77 y.o. female  DOB 28-Jun-1945  MRN [id number].  Admission date:  10/02/2022  Admitting Physician  Onnie Boer, MD  Discharge Date:  10/03/2022   Primary MD  Kirby Funk, MD (Inactive)  Recommendations for primary care physician for things to follow:   1)Very low-salt diet advised 2)Weigh yourself daily, call if you gain more than 3 pounds in 1 day or more than 5 pounds in 1 week as your diuretic medications may need to be adjusted 3)Limit your Fluid  intake to no more than 60 ounces (1.8 Liters) per day 4)Avoid ibuprofen/Advil/Aleve/Motrin/Goody Powders/Naproxen/BC powders/Meloxicam/Diclofenac/Indomethacin and other Nonsteroidal anti-inflammatory medications as these will make you more likely to bleed and can cause stomach ulcers, can also cause Kidney problems. 5)Repeat CBC and BMP Blood Test within 1 week advised--- 6)Sacral wound care--- Clean sacrum and buttocks with soap and water, dry and apply a thin layer of Desitin 3 times a day and prn soiling.  WOULD LEAVE SILICONE FOAM OFF THIS AREA AND PLACE ON LOW AIR LOSS MATTRESS.  Admission Diagnosis  Hypokalemia [E87.6] Hypoxia [R09.02] Congestive heart failure, unspecified HF chronicity, unspecified heart failure type (HCC) [I50.9] Acute hypoxic respiratory failure (HCC) [J96.01]   Discharge Diagnosis  Hypokalemia [E87.6] Hypoxia [R09.02] Congestive heart failure, unspecified HF chronicity, unspecified heart failure type (HCC) [I50.9] Acute hypoxic respiratory failure (HCC) [J96.01]    Principal Problem:   Acute hypoxic respiratory failure (HCC) Active Problems:   Hypokalemia   Chronic systolic CHF (congestive heart failure) (HCC)   CAP (community acquired pneumonia)   Sacral decubitus ulcer   Hypertension   GAD (generalized anxiety disorder)   COPD (chronic obstructive pulmonary disease) (HCC)      Past  Medical History:  Diagnosis Date   CHF (congestive heart failure) (HCC)    COPD (chronic obstructive pulmonary disease) (HCC)    Coronary artery disease    GAD (generalized anxiety disorder)    GERD (gastroesophageal reflux disease)    Hypercholesteremia    Hypertension     Past Surgical History:  Procedure Laterality Date   ABDOMINAL HYSTERECTOMY     BLADDER SURGERY     RIGHT/LEFT HEART CATH AND CORONARY ANGIOGRAPHY N/A 05/01/2021   Procedure: RIGHT/LEFT HEART CATH AND CORONARY ANGIOGRAPHY;  Surgeon: [place], Peter M, MD;  Location: [facility];  Service: Cardiovascular;  Laterality: N/A;       HPI  from the history and physical done on the day of admission:    Chief Complaint: Difficulty Breathing, Cough   HPI: Melinda Gomez is a 77 y.o. female with medical history significant for systolic CHF, hypertension, COPD, coronary artery disease. Patient presented to the ED with complaints of 1 week of difficulty breathing, with dry cough.  No chest pain.  Lower extremity swelling, but family reports abdomen getting bigger over the past several months.  She has not been able to check her weight regularly in a long while she has poor balance and is unable to stand.  Family reports weight  gain, over the past 6 months, baseline weight about 150, reports she is now about 160s.  She has been compliant with her Lasix 20 mg daily.  No fevers no chills.  Not on home O2.  Quit smoking cigarettes 2 years ago. Spouse also reports sacral ulcer over the past 2 weeks it has improved, she required 2 courses of antibiotics last dose was about 4 days ago. ??Cephalexin, Daughter at bedside not sure.   EMS reports O2 sat of 78%.   ED Course: Tmax 97.7.  Heart rate 70s to 90s.  Respiratory rate 15-28.  Blood pressure systolic 90s to 116.  O2 sats down to 88% on room air.  BNP normal- 60.  Chest X-ray with small left pleural effusion, atelectasis versus infection. Potassium 2.3. IV Lasix 40 mg x 1  given.     Review of Systems: As per HPI all other systems reviewed and negative.   Hospital Course:     Assessment and Plan: 1)CAP--clinically much improved -Treated with IV Rocephin and doxycycline , mucolytics and bronchodilators okay to discharge on Flagyl and doxycycline No fevers no significant dyspnea  2) Acute hypoxic respiratory failure (HCC) -Required up to 2 L of oxygen via nasal cannula on admission   BNP unremarkable at 60. -Hypoxia is most likely due to pneumonia as above #1 -Hypoxia has resolved at this time -Patient is on room air with O2 sats of 93 to 95%  3)Chronic systolic CHF (congestive heart failure) (HCC) BNP unremarkable at 60, no lower extremity swelling.,  -Echo from 04/2022 shows improved EF of 50 to 55%, from prior low EF of 25 to 30% in 2022. -Received IV Lasix -Discharge on oral Lasix  Hypokalemia/hypomagnesemia -Due to diuretic use, replace  Sacral decubitus ulcer Appears to have improved.  Currently stage I with diffuse redness without signs of infection.  Per spouse completed 2 courses of antibiotics. -Continue home health RN for wound care  GAD (generalized anxiety disorder) Confirmed medications with daughter at bedside - resume risperidone, venlafaxine, as needed Xanax  Hypertension -Okay to restart metoprolol, Entresto, spironolactone     COPD (chronic obstructive pulmonary disease) (HCC) Appears stable.  Quit smoking cigarettes 2 yrs ago. -Continue bronchodilators and antibiotics as above #1   Discharge Condition: STABLE   Follow UP   Follow-up Information     Kirby Funk, MD. Schedule an appointment as soon as possible for a visit in 1 week(s).   Specialty: Internal Medicine Why: Repeat CBC and BMP Blood Tests Contact information: [address] [place] [address] [phone]                  Diet and Activity recommendation:  As advised  Discharge Instructions     Discharge Instructions      Call MD for:  difficulty breathing, headache or visual disturbances   Complete by: As directed    Call MD for:  persistant dizziness or light-headedness   Complete by: As directed    Call MD for:  temperature >100.4   Complete by: As directed    Diet - low sodium heart healthy   Complete by: As directed    Discharge instructions   Complete by: As directed    1)Very low-salt diet advised 2)Weigh yourself daily, call if you gain more than 3 pounds in 1 day or more than 5 pounds in 1 week as your diuretic medications may need to be adjusted 3)Limit your Fluid  intake to no more than 60  ounces (1.8 Liters) per day 4)Avoid ibuprofen/Advil/Aleve/Motrin/Goody Powders/Naproxen/BC powders/Meloxicam/Diclofenac/Indomethacin and other Nonsteroidal anti-inflammatory medications as these will make you more likely to bleed and can cause stomach ulcers, can also cause Kidney problems. 5)Repeat CBC and BMP Blood Test within 1 week advised--- 6)Sacral wound care--- Clean sacrum and buttocks with soap and water, dry and apply a thin layer of Desitin 3 times a day and prn soiling.  WOULD LEAVE SILICONE FOAM OFF THIS AREA AND PLACE ON LOW AIR LOSS MATTRESS.   Discharge wound care:   Complete by: As directed    As above   Increase activity slowly   Complete by: As directed          Discharge Medications     Allergies as of 10/03/2022       Reactions   Hydrochlorothiazide    Other reaction(s): hyponatremia   Lisinopril    Other reaction(s): cough   Norpramin [desipramine] Other (See Comments)   Jittery   Other Other (See Comments)   Estratest  depression & palpitation   Talwin [pentazocine] Other (See Comments)   hallucinations   Verapamil    Other reaction(s): cough        Medication List     STOP taking these medications    Coenzyme Q10 150 MG Caps   OVER THE COUNTER MEDICATION       TAKE these medications    acetaminophen 325 MG tablet Commonly known as: TYLENOL Take  2 tablets (650 mg total) by mouth every 6 (six) hours as needed for mild pain (or Fever >/= 101).   albuterol 108 (90 Base) MCG/ACT inhaler Commonly known as: VENTOLIN HFA Inhale 2 puffs into the lungs every 4 (four) hours as needed for shortness of breath or wheezing.   ALPRAZolam 0.5 MG tablet Commonly known as: XANAX TAKE ONE TABLET BY MOUTH 3 TIMES DAILY AS NEEDED FOR ANXIETY. What changed:  how much to take how to take this when to take this reasons to take this additional instructions   Breztri Aerosphere 160-9-4.8 MCG/ACT Aero Generic drug: Budeson-Glycopyrrol-Formoterol Inhale 2 puffs into the lungs in the morning and at bedtime.   cephALEXin 500 MG capsule Commonly known as: Keflex Take 1 capsule (500 mg total) by mouth 3 (three) times daily for 5 days.   cholestyramine 4 GM/DOSE powder Commonly known as: QUESTRAN Take 4 g by mouth See admin instructions. 13 grams added to 2-6 oz of water as needed   cyclobenzaprine 10 MG tablet Commonly known as: FLEXERIL Take 10 mg by mouth as needed for muscle spasms.   dapagliflozin propanediol 10 MG Tabs tablet Commonly known as: FARXIGA Take 1 tablet (10 mg total) by mouth daily before breakfast.   dextromethorphan-guaiFENesin 30-600 MG 12hr tablet Commonly known as: MUCINEX DM Take 1 tablet by mouth 2 (two) times daily for 10 days.   diphenhydrAMINE 25 mg capsule Commonly known as: BENADRYL Take 25 mg by mouth every 6 (six) hours as needed for allergies.   doxycycline 100 MG tablet Commonly known as: VIBRA-TABS Take 1 tablet (100 mg total) by mouth 2 (two) times daily for 5 days.   Entresto 49-51 MG Generic drug: sacubitril-valsartan Take 1 tablet by mouth 2 (two) times daily.   furosemide 40 MG tablet Commonly known as: LASIX Take 20 mg by mouth daily.   loratadine 10 MG tablet Commonly known as: CLARITIN Take 10 mg by mouth daily.   metoprolol succinate 50 MG 24 hr tablet Commonly known as:  TOPROL-XL Take 1 tablet (  50 mg total) by mouth daily for heart and blood pressure. Take with or immediately following a meal.   predniSONE 50 MG tablet Commonly known as: DELTASONE Take 1 tablet (50 mg total) by mouth daily with breakfast for 3 days. Start taking on: October 04, 2022   promethazine 12.5 MG tablet Commonly known as: PHENERGAN Take 12.5 mg by mouth every 6 (six) hours as needed for nausea.   risperiDONE 1 MG tablet Commonly known as: RISPERDAL Take 1 tablet (1 mg total) by mouth at bedtime.   rizatriptan 5 MG tablet Commonly known as: MAXALT Take 5 mg by mouth as needed for migraine.   rosuvastatin 20 MG tablet Commonly known as: CRESTOR Take 20 mg by mouth daily.   spironolactone 25 MG tablet Commonly known as: ALDACTONE TAKE 1/2 TABLET BY MOUTH DAILY FOR FLUID. What changed: See the new instructions.   traMADol 50 MG tablet Commonly known as: ULTRAM Take 50 mg by mouth daily as needed (migraine).   traZODone 100 MG tablet Commonly known as: DESYREL TAKE 1 TO2 TABLETS AT BEDTIME AS NEEDED FOR SLEEP. USE THE LOWEST DOSE THAT WORKS. What changed:  how much to take how to take this when to take this additional instructions   Trelegy Ellipta 100-62.5-25 MCG/ACT Aepb Generic drug: Fluticasone-Umeclidin-Vilant Inhale 1 puff into the lungs daily. What changed:  when to take this reasons to take this   venlafaxine XR 75 MG 24 hr capsule Commonly known as: EFFEXOR-XR 1 capsule in the morning and 2 capsules in the evening What changed:  how much to take how to take this when to take this   Vitamin D3 125 MCG (5000 UT) Tabs Take 5,000 Units by mouth daily.               Discharge Care Instructions  (From admission, onward)           Start     Ordered   10/03/22 0000  Discharge wound care:       Comments: As above   10/03/22 1505            Major procedures and Radiology Reports - PLEASE review detailed and final reports for all  details, in brief -   DG Chest Portable 1 View  Result Date: 10/02/2022 CLINICAL DATA:  SOB EXAM: PORTABLE CHEST 1 VIEW COMPARISON:  February 22, 2021, December 30, 2020 FINDINGS: The cardiomediastinal silhouette is unchanged in contour. Small LEFT pleural effusion. No pneumothorax. LEFT basilar mixed density opacity. Small RIGHT basilar nodular opacity, possibly corresponding to a similar appearing nodular opacity on prior chest CT in 2022. IMPRESSION: Small LEFT pleural effusion with adjacent opacities which could reflect atelectasis versus infection. Followup PA and lateral chest X-ray is recommended in 3-4 weeks to ensure resolution and exclude underlying malignancy. Electronically Signed   By: Meda Klinefelter M.D.   On: 10/02/2022 15:21     Today   Subjective    [name] today has no new complaints  No fever  Or chills  -Husband at bedside, states breathing is much better -Completely weaned off oxygen         Patient has been seen and examined prior to discharge   Objective   Blood pressure 105/66, pulse 93, temperature 98.1 F (36.7 C), temperature source Oral, resp. rate 20, height 5\' 3"  (1.6 m), weight 75.3 kg, SpO2 99%.   Intake/Output Summary (Last 24 hours) at 10/03/2022 1507 Last data filed at 10/03/2022 1300 Gross per 24 hour  Intake 1323.53 ml  Output 3100 ml  Net -1776.47 ml   Exam Gen:- Awake Alert, no acute distress, continue complete sentences HEENT:- [place].AT, No sclera icterus Neck-Supple Neck,No JVD,.  Lungs-fair air movement, no wheezing  CV- S1, S2 normal, regular Abd-  +ve B.Sounds, Abd Soft, No tenderness,    Extremity/Skin:- No  edema,   good pulses Psych-affect is appropriate, oriented x3 Neuro-no new focal deficits, no tremors    Data Review   CBC w Diff:  Lab Results  Component Value Date   WBC 6.5 10/03/2022   HGB 10.1 (L) 10/03/2022   HGB 14.7 04/18/2022   HCT 30.7 (L) 10/03/2022   HCT 43.9 04/18/2022   PLT 246 10/03/2022   PLT 203  04/18/2022   LYMPHOPCT 20 02/22/2021   MONOPCT 10 02/22/2021   EOSPCT 1 02/22/2021   BASOPCT 0 02/22/2021    CMP:  Lab Results  Component Value Date   NA 138 10/03/2022   NA 138 04/18/2022   K 3.4 (L) 10/03/2022   CL 103 10/03/2022   CO2 29 10/03/2022   BUN 13 10/03/2022   BUN 17 04/18/2022   CREATININE 1.47 (H) 10/03/2022   PROT 6.8 04/18/2022   ALBUMIN 4.6 04/18/2022   BILITOT <0.2 04/18/2022   ALKPHOS 69 04/18/2022   AST 19 04/18/2022   ALT 20 04/18/2022   Total Discharge time is about 33 minutes  Shon Hale M.D on 10/03/2022 at 3:07 PM  Go to [email or web address] -  for contact info  [facility] - Office  [phone]

## 2022-10-03 NOTE — Care Management Obs Status (Signed)
MEDICARE OBSERVATION STATUS NOTIFICATION   Patient Details  Name: MASHAL SLAVICK MRN: [id number] Date of Birth: [birth year]   Medicare Observation Status Notification Given:  No (patient discharged prior to 24 hours)    Corey Harold 10/03/2022, 3:54 PM

## 2022-10-03 NOTE — Consult Note (Signed)
WOC Nurse Consult Note: this consult performed remotely after review of EMR, photos and discussion with bedside nurse  Reason for Consult: sacrum stage 1 Pressure Injury  Wound type: Stage 1 Pressure Injury sacrum/buttocks Pressure Injury POA: Yes Measurement: see nursing flowsheet, widespread across sacrum and buttocks Wound bed: 1. per MD description diffuse area of redness, no open wound; has the appearance of chronic tissue damage with red purple discoloration in photo  2. MASD at lower buttocks groin area (noted to be wearing a pull up in photo) erythema and scattered areas of partial thickness skin loss  ICD-10 CM Codes for Irritant Dermatitis L24A2 - Due to fecal, urinary or dual incontinence Drainage (amount, consistency, odor) no drainage per MD note  Periwound: erythematous  Dressing procedure/placement/frequency: 1. Clean sacrum and buttocks with soap and water, dry and apply a thin layer of Desitin 3 times a day and prn soiling.  WOULD LEAVE SILICONE FOAM OFF THIS AREA AND PLACE ON LOW AIR LOSS MATTRESS.  2.  Low air loss mattress for moisture management and pressure redistribution.   POC discussed with bedside nurse. WOC team will not follow. Re-consult if further needs arise.   Thank you,      Priscella Mann MSN, RN-BC, [employer] [phone]

## 2022-10-04 ENCOUNTER — Encounter: Payer: Self-pay | Admitting: Cardiovascular Disease

## 2022-10-07 ENCOUNTER — Ambulatory Visit (HOSPITAL_COMMUNITY): Payer: PPO | Admitting: Speech Pathology

## 2022-10-14 ENCOUNTER — Ambulatory Visit (HOSPITAL_COMMUNITY): Payer: PPO | Admitting: Speech Pathology

## 2022-10-21 ENCOUNTER — Ambulatory Visit (HOSPITAL_COMMUNITY): Payer: PPO | Admitting: Speech Pathology

## 2022-10-22 NOTE — Progress Notes (Unsigned)
Cardiology Office Note:   Date:  10/24/2022  NAME:  Melinda Gomez    MRN: [id number] DOB:  11/29/1945   PCP:  Thana Ates, MD  Cardiologist:  Reatha Harps, MD  Electrophysiologist:  None   Referring MD: Thana Ates, MD   Chief Complaint  Patient presents with   Follow-up    History of Present Illness:   Melinda Gomez is a 77 y.o. female with a hx of CHF, nonobstructive CAD, DM, HLD who presents for follow-up.  She was hospitalized for pneumonia recently.  There was also concern for congestive heart failure but BNP was normal.  She has no evidence of heart failure today.  Weights are going down.  I have instructed her to take her Lasix as needed.  BP today 92/59.  I would like for her to back off on her Entresto.  We will cut this in half.  She does not appear symptomatic.  No dizziness or lightheadedness.  She reports no chest pains or trouble breathing.  She does need a repeat chest x-ray to make sure her pneumonia is resolving.  No fevers or chills reported.  She denies any heart failure symptoms.  Overall she is recovering well from her hospitalization.  Problem List COPD/Tobacco abuse HTN HLD LBBB, QRS 168 ms Systolic HF (non-ischemic) -02/2021: EF 25-30% -09/2021: EF 40-45% -04/2022: EF 50-55% -MPI infarct 02/23/2021 -LHC 30% RCA (non-obstructive) 6. HLD -T chol 134, HDL 47, LDL 61, TG 157 7. DM -A1c 7.6  Past Medical History: Past Medical History:  Diagnosis Date   CHF (congestive heart failure) (HCC)    COPD (chronic obstructive pulmonary disease) (HCC)    Coronary artery disease    GAD (generalized anxiety disorder)    GERD (gastroesophageal reflux disease)    Hypercholesteremia    Hypertension     Past Surgical History: Past Surgical History:  Procedure Laterality Date   ABDOMINAL HYSTERECTOMY     BLADDER SURGERY     RIGHT/LEFT HEART CATH AND CORONARY ANGIOGRAPHY N/A 05/01/2021   Procedure: RIGHT/LEFT HEART CATH AND CORONARY ANGIOGRAPHY;  Surgeon:  [place], Peter M, MD;  Location: [facility];  Service: Cardiovascular;  Laterality: N/A;    Current Medications: Current Meds  Medication Sig   albuterol (VENTOLIN HFA) 108 (90 Base) MCG/ACT inhaler Inhale 2 puffs into the lungs every 4 (four) hours as needed for shortness of breath or wheezing.   ALPRAZolam (XANAX) 0.5 MG tablet TAKE ONE TABLET BY MOUTH 3 TIMES DAILY AS NEEDED FOR ANXIETY. (Patient taking differently: Take 0.5 mg by mouth 3 (three) times daily as needed for anxiety.)   Budeson-Glycopyrrol-Formoterol (BREZTRI AEROSPHERE) 160-9-4.8 MCG/ACT AERO Inhale 2 puffs into the lungs in the morning and at bedtime.   cetirizine (ZYRTEC) 10 MG tablet Take 10 mg by mouth daily.   Cholecalciferol (VITAMIN D3) 125 MCG (5000 UT) TABS Take 5,000 Units by mouth daily.   cholestyramine (QUESTRAN) 4 GM/DOSE powder Take 4 g by mouth See admin instructions. 13 grams added to 2-6 oz of water as needed   cyclobenzaprine (FLEXERIL) 10 MG tablet Take 10 mg by mouth as needed for muscle spasms.   dapagliflozin propanediol (FARXIGA) 10 MG TABS tablet Take 1 tablet (10 mg total) by mouth daily before breakfast.   diphenhydrAMINE (BENADRYL) 25 mg capsule Take 25 mg by mouth every 6 (six) hours as needed for allergies.   furosemide (LASIX) 40 MG tablet Take 1 tablet (40 mg total) by mouth as needed for  edema.   metoprolol succinate (TOPROL-XL) 50 MG 24 hr tablet Take 1 tablet (50 mg total) by mouth daily for heart and blood pressure. Take with or immediately following a meal.   promethazine (PHENERGAN) 12.5 MG tablet Take 12.5 mg by mouth every 6 (six) hours as needed for nausea.   risperiDONE (RISPERDAL) 1 MG tablet Take 1 tablet (1 mg total) by mouth at bedtime.   rizatriptan (MAXALT) 5 MG tablet Take 5 mg by mouth as needed for migraine.   rosuvastatin (CRESTOR) 20 MG tablet Take 20 mg by mouth daily.   sacubitril-valsartan (ENTRESTO) 24-26 MG Take 1 tablet by mouth 2 (two) times daily.    spironolactone (ALDACTONE) 25 MG tablet TAKE 1/2 TABLET BY MOUTH DAILY FOR FLUID. (Patient taking differently: Take 12.5 mg by mouth daily.)   traZODone (DESYREL) 100 MG tablet TAKE 1 TO2 TABLETS AT BEDTIME AS NEEDED FOR SLEEP. USE THE LOWEST DOSE THAT WORKS. (Patient taking differently: Take 200 mg by mouth at bedtime. USE THE LOWEST DOSE THAT WORKS.)   venlafaxine XR (EFFEXOR-XR) 75 MG 24 hr capsule 1 capsule in the morning and 2 capsules in the evening (Patient taking differently: Take 75-150 mg by mouth in the morning and at bedtime. 1 capsule in the morning and 2 capsules in the evening)   [DISCONTINUED] furosemide (LASIX) 40 MG tablet Take 20 mg by mouth daily.   [DISCONTINUED] sacubitril-valsartan (ENTRESTO) 49-51 MG Take 1 tablet by mouth 2 (two) times daily.     Allergies:    Hydrochlorothiazide, Lisinopril, Norpramin [desipramine], Other, Talwin [pentazocine], and Verapamil   Social History: Social History   Socioeconomic History   Marital status: Married    Spouse name: Not on file   Number of children: 2   Years of education: Not on file   Highest education level: Not on file  Occupational History   Occupation: Retired - IT consultant  Tobacco Use   Smoking status: Former    Current packs/day: 0.00    Average packs/day: 0.5 packs/day for 55.0 years (27.5 ttl pk-yrs)    Types: Cigarettes    Start date: 02/1966    Quit date: 02/2021    Years since quitting: 1.6   Smokeless tobacco: Never  Substance and Sexual Activity   Alcohol use: Never   Drug use: Never   Sexual activity: Not on file  Other Topics Concern   Not on file  Social History Narrative   Not on file   Social Determinants of Health   Financial Resource Strain: Not on file  Food Insecurity: Not on file  Transportation Needs: Not on file  Physical Activity: Not on file  Stress: Not on file  Social Connections: Not on file     Family History: The patient's family history includes Stroke in  her mother.  ROS:   All other ROS reviewed and negative. Pertinent positives noted in the HPI.     EKGs/Labs/Other Studies Reviewed:   The following studies were personally reviewed by me today:  EKG:  EKG is not ordered today.        Recent Labs: 04/18/2022: ALT 20; TSH 3.100 10/02/2022: B Natriuretic Peptide 60.0 10/03/2022: BUN 13; Creatinine, Ser 1.47; Hemoglobin 10.1; Magnesium 1.7; Platelets 246; Potassium 3.4; Sodium 138   Recent Lipid Panel No results found for: "CHOL", "TRIG", "HDL", "CHOLHDL", "VLDL", "LDLCALC", "LDLDIRECT"  Physical Exam:   VS:  BP (!) 96/44 (BP Location: Right Arm, Patient Position: Sitting, Cuff Size: Normal)   Pulse 83  Ht 5\' 3"  (1.6 m)   Wt 155 lb (70.3 kg)   SpO2 93%   BMI 27.46 kg/m    Wt Readings from Last 3 Encounters:  10/24/22 155 lb (70.3 kg)  10/02/22 166 lb (75.3 kg)  09/03/22 163 lb (73.9 kg)    General: Well nourished, well developed, in no acute distress Head: Atraumatic, normal size  Eyes: PEERLA, EOMI  Neck: Supple, no JVD Endocrine: No thryomegaly Cardiac: Normal S1, S2; RRR; no murmurs, rubs, or gallops Lungs: Clear to auscultation bilaterally, no wheezing, rhonchi or rales  Abd: Soft, nontender, no hepatomegaly  Ext: No edema, pulses 2+ Musculoskeletal: No deformities, BUE and BLE strength normal and equal Skin: Warm and dry, no rashes   Neuro: Alert and oriented to person, place, time, and situation, CNII-XII grossly intact, no focal deficits  Psych: Normal mood and affect   ASSESSMENT:   Melinda Gomez is a 78 y.o. female who presents for the following: 1. Chronic systolic CHF (congestive heart failure) (HCC)   2. LBBB (left bundle branch block)   3. Primary hypertension   4. Coronary artery disease involving native coronary artery of native heart without angina pectoris   5. Hyperlipidemia LDL goal <70   6. Medication management     PLAN:   1. Chronic systolic CHF (congestive heart failure) (HCC) 2. LBBB  (left bundle branch block) -Nonischemic cardiomyopathy.  Ejection fraction has recovered.  Blood pressure is a bit too low.  Reduce Entresto 24-26 mg twice daily.  I would like to recheck a BMP.  Potassium was low as well.  She can take Lasix as needed.  No signs of worsening heart failure.  She is on Aldactone 12.5 mg daily, Farxiga 10 mg daily, metoprolol succinate 50 mg daily.  I would like for her to come back in 6 weeks for blood pressure check and see me in 3 months.  We may need to back off on her medications.  Overall I believe her blood pressure is low due to dehydration today.  3. Primary hypertension -Reducing Entresto.  BP a bit too low.  Likely dehydration related.  See discussion above.  4. Coronary artery disease involving native coronary artery of native heart without angina pectoris 5. Hyperlipidemia LDL goal <70 -Nonobstructive CAD.  No symptoms of angina.  Continue aspirin and Crestor.  Lipids are at goal.  Disposition: Return in about 6 weeks (around 12/05/2022).  Medication Adjustments/Labs and Tests Ordered: Current medicines are reviewed at length with the patient today.  Concerns regarding medicines are outlined above.  Orders Placed This Encounter  Procedures   Basic Metabolic Panel (BMET)   Meds ordered this encounter  Medications   sacubitril-valsartan (ENTRESTO) 24-26 MG    Sig: Take 1 tablet by mouth 2 (two) times daily.    Dispense:  180 tablet    Refill:  3    Dose change   furosemide (LASIX) 40 MG tablet    Sig: Take 1 tablet (40 mg total) by mouth as needed for edema.    Dispense:  90 tablet    Refill:  3   Patient Instructions  Medication Instructions:  Your physician has recommended you make the following change in your medication:  DECREASE: Entresto 24-26 mg twice daily CHANGE: Lasix 40 mg as needed  *If you need a refill on your cardiac medications before your next appointment, please call your pharmacy*   Lab Work: Your physician recommends  that you have labs drawn today: BMET If you  have labs (blood work) drawn today and your tests are completely normal, you will receive your results only by: MyChart Message (if you have MyChart) OR A paper copy in the mail If you have any lab test that is abnormal or we need to change your treatment, we will call you to review the results.   Testing/Procedures: None   Follow-Up: At [facility], you and your health needs are our priority.  As part of our continuing mission to provide you with exceptional heart care, we have created designated Provider Care Teams.  These Care Teams include your primary Cardiologist (physician) and Advanced Practice Providers (APPs -  Physician Assistants and Nurse Practitioners) who all work together to provide you with the care you need, when you need it.   Your next appointment:   6 week(s)  Provider:   Edd Fabian, FNP, Micah Flesher, PA-C, Marjie Skiff, PA-C, Robet Leu, PA-C, Juanda Crumble, PA-C, Joni Reining, DNP, ANP, Azalee Course, PA-C, Bernadene Person, NP, Perlie Gold, PA-C, or Carlos Levering, NP    Then, Reatha Harps, MD will plan to see you again in 3 month(s).     Time Spent with Patient: I have spent a total of 35 minutes with patient reviewing hospital notes, telemetry, EKGs, labs and examining the patient as well as establishing an assessment and plan that was discussed with the patient.  > 50% of time was spent in direct patient care.  Signed, Lenna Gilford. Flora Lipps, MD, [facility]  [facility]  [address] [place], [place] [address] [phone]  10/24/2022 4:10 PM

## 2022-10-24 ENCOUNTER — Encounter: Payer: Self-pay | Admitting: Cardiovascular Disease

## 2022-10-24 ENCOUNTER — Ambulatory Visit: Payer: PPO | Attending: Cardiovascular Disease | Admitting: Cardiovascular Disease

## 2022-10-24 VITALS — BP 96/44 | HR 83 | Ht 63.0 in | Wt 155.0 lb

## 2022-10-24 DIAGNOSIS — I447 Left bundle-branch block, unspecified: Secondary | ICD-10-CM

## 2022-10-24 DIAGNOSIS — E785 Hyperlipidemia, unspecified: Secondary | ICD-10-CM

## 2022-10-24 DIAGNOSIS — I10 Essential (primary) hypertension: Secondary | ICD-10-CM | POA: Diagnosis not present

## 2022-10-24 DIAGNOSIS — I2510 Atherosclerotic heart disease of native coronary artery without angina pectoris: Secondary | ICD-10-CM | POA: Diagnosis not present

## 2022-10-24 DIAGNOSIS — I5022 Chronic systolic (congestive) heart failure: Secondary | ICD-10-CM | POA: Diagnosis not present

## 2022-10-24 DIAGNOSIS — Z79899 Other long term (current) drug therapy: Secondary | ICD-10-CM

## 2022-10-24 MED ORDER — ENTRESTO 24-26 MG PO TABS: 1.0000 | ORAL_TABLET | Freq: Two times a day (BID) | ORAL | 3 refills | Status: DC

## 2022-10-24 MED ORDER — FUROSEMIDE 40 MG PO TABS: 40.0000 mg | ORAL_TABLET | ORAL | 3 refills | Status: DC | PRN

## 2022-10-24 NOTE — Patient Instructions (Signed)
Medication Instructions:  Your physician has recommended you make the following change in your medication:  DECREASE: Entresto 24-26 mg twice daily CHANGE: Lasix 40 mg as needed  *If you need a refill on your cardiac medications before your next appointment, please call your pharmacy*   Lab Work: Your physician recommends that you have labs drawn today: BMET If you have labs (blood work) drawn today and your tests are completely normal, you will receive your results only by: MyChart Message (if you have MyChart) OR A paper copy in the mail If you have any lab test that is abnormal or we need to change your treatment, we will call you to review the results.   Testing/Procedures: None   Follow-Up: At [facility], you and your health needs are our priority.  As part of our continuing mission to provide you with exceptional heart care, we have created designated Provider Care Teams.  These Care Teams include your primary Cardiologist (physician) and Advanced Practice Providers (APPs -  Physician Assistants and Nurse Practitioners) who all work together to provide you with the care you need, when you need it.   Your next appointment:   6 week(s)  Provider:   Edd Fabian, FNP, Micah Flesher, PA-C, Marjie Skiff, PA-C, Robet Leu, PA-C, Juanda Crumble, PA-C, Joni Reining, DNP, ANP, Azalee Course, PA-C, Bernadene Person, NP, Perlie Gold, PA-C, or Carlos Levering, NP    Then, Reatha Harps, MD will plan to see you again in 3 month(s).

## 2022-11-07 ENCOUNTER — Ambulatory Visit (INDEPENDENT_AMBULATORY_CARE_PROVIDER_SITE_OTHER): Payer: PPO | Admitting: Pulmonary Disease

## 2022-11-07 ENCOUNTER — Encounter (HOSPITAL_BASED_OUTPATIENT_CLINIC_OR_DEPARTMENT_OTHER): Payer: Self-pay | Admitting: Pulmonary Disease

## 2022-11-07 VITALS — BP 112/60 | HR 79 | Resp 14 | Ht 63.0 in | Wt 153.0 lb

## 2022-11-07 DIAGNOSIS — J479 Bronchiectasis, uncomplicated: Secondary | ICD-10-CM

## 2022-11-07 DIAGNOSIS — Z87891 Personal history of nicotine dependence: Secondary | ICD-10-CM

## 2022-11-07 MED ORDER — BREZTRI AEROSPHERE 160-9-4.8 MCG/ACT IN AERO: 2.0000 | INHALATION_SPRAY | Freq: Two times a day (BID) | RESPIRATORY_TRACT | 11 refills | Status: DC

## 2022-11-07 NOTE — Patient Instructions (Signed)
Bronchiectasis Shortness of breath, wheezing - resolved --CONTINUE Breztri TWO puffs in the morning and evening. Use spacer --CONTINUE Albuterol TWO puffs as needed for shortness of breath or wheezing or chest tightness --Encouraged vaccines including influenza and RSV. Also discussed covid vaccine --Continue regular aerobic activity  Low-normal O2 --Consider ONO in the future if dyspnea worsens --Did not require oxygen at discharge

## 2022-11-07 NOTE — Progress Notes (Signed)
Subjective:   PATIENT ID: Melinda Gomez GENDER: female DOB: Aug 13, 1945, MRN: [id number]  Chief Complaint  Patient presents with   Follow-up    Was seen in July in hospital. She states that she is doing ok. They said the breathing apparatus you gave them is good. And she isnt having to use inhalers as often but they want to know when to use it.     Reason for Visit: Follow-up bronchiectasis, shortness of breath  Melinda Gomez is a 77 year old female former with CAD, chronic systolic heart failure with normalized EF, DM2, HTN, depression and anxiety who presents for evaluation for bronchiectasis. Referred by Cardiology, Dr. Flora Lipps. Present with husband and daughter who provides additional history.  Initial consult She reports long standing shortness of breath for >1 year that worsen with activity. Associated with nonproductive cough and wheezes with minimal activity. She is sedentary at baseline and needs assistance with getting dressed. Minimal activity can be straining for you. Family reports she has been debilitated for at least five years. Cardiology has ordered physical therapy. Was started on Trelegy and consistently taking in the last month. She does not feel like it works. Does not use her rescue inhaler.  11/07/22 She was hospitalized for respiratory failure due to pneumonia and heart failure exacerbation. Doing well since discharge. Did not require O2 at discharge. Since using Breztri with her spacer she has had less shortness of breath and denying cough and wheezing. Has not needed rescue inhaler. Finished working PT. Able to take meals at the table instead of in bed now. Husband and daughter are happy with her current condition.  Social History: Former smoker. Quit in 02/2021. Previously 1 ppd x 50 years.  Past Medical History:  Diagnosis Date   CHF (congestive heart failure) (HCC)    COPD (chronic obstructive pulmonary disease) (HCC)    Coronary artery disease    GAD  (generalized anxiety disorder)    GERD (gastroesophageal reflux disease)    Hypercholesteremia    Hypertension      Family History  Problem Relation Age of Onset   Stroke Mother      Social History   Occupational History   Occupation: Retired - IT consultant  Tobacco Use   Smoking status: Former    Current packs/day: 0.00    Average packs/day: 0.5 packs/day for 55.0 years (27.5 ttl pk-yrs)    Types: Cigarettes    Start date: 02/1966    Quit date: 02/2021    Years since quitting: 1.7   Smokeless tobacco: Never  Substance and Sexual Activity   Alcohol use: Never   Drug use: Never   Sexual activity: Not on file    Allergies  Allergen Reactions   Hydrochlorothiazide     Other reaction(s): hyponatremia   Lisinopril     Other reaction(s): cough   Norpramin [Desipramine] Other (See Comments)    Jittery   Other Other (See Comments)    Estratest  depression & palpitation   Talwin [Pentazocine] Other (See Comments)    hallucinations   Verapamil     Other reaction(s): cough     Outpatient Medications Prior to Visit  Medication Sig Dispense Refill   acetaminophen (TYLENOL) 325 MG tablet Take 2 tablets (650 mg total) by mouth every 6 (six) hours as needed for mild pain (or Fever >/= 101).     albuterol (VENTOLIN HFA) 108 (90 Base) MCG/ACT inhaler Inhale 2 puffs into the lungs every 4 (four)  hours as needed for shortness of breath or wheezing. 18 g 2   ALPRAZolam (XANAX) 0.5 MG tablet TAKE ONE TABLET BY MOUTH 3 TIMES DAILY AS NEEDED FOR ANXIETY. (Patient taking differently: Take 0.5 mg by mouth 3 (three) times daily as needed for anxiety.) 90 tablet 5   cetirizine (ZYRTEC) 10 MG tablet Take 10 mg by mouth daily.     Cholecalciferol (VITAMIN D3) 125 MCG (5000 UT) TABS Take 5,000 Units by mouth daily.     cholestyramine (QUESTRAN) 4 GM/DOSE powder Take 4 g by mouth See admin instructions. 13 grams added to 2-6 oz of water as needed     cyclobenzaprine (FLEXERIL) 10 MG  tablet Take 10 mg by mouth as needed for muscle spasms.     dapagliflozin propanediol (FARXIGA) 10 MG TABS tablet Take 1 tablet (10 mg total) by mouth daily before breakfast. 90 tablet 3   diphenhydrAMINE (BENADRYL) 25 mg capsule Take 25 mg by mouth every 6 (six) hours as needed for allergies.     furosemide (LASIX) 40 MG tablet Take 1 tablet (40 mg total) by mouth as needed for edema. 90 tablet 3   metoprolol succinate (TOPROL-XL) 50 MG 24 hr tablet Take 1 tablet (50 mg total) by mouth daily for heart and blood pressure. Take with or immediately following a meal. 90 tablet 3   promethazine (PHENERGAN) 12.5 MG tablet Take 12.5 mg by mouth every 6 (six) hours as needed for nausea.     risperiDONE (RISPERDAL) 1 MG tablet Take 1 tablet (1 mg total) by mouth at bedtime. 30 tablet 5   rizatriptan (MAXALT) 5 MG tablet Take 5 mg by mouth as needed for migraine.     rosuvastatin (CRESTOR) 20 MG tablet Take 20 mg by mouth daily.     sacubitril-valsartan (ENTRESTO) 24-26 MG Take 1 tablet by mouth 2 (two) times daily. 180 tablet 3   spironolactone (ALDACTONE) 25 MG tablet TAKE 1/2 TABLET BY MOUTH DAILY FOR FLUID. (Patient taking differently: Take 12.5 mg by mouth daily.) 45 tablet 2   traZODone (DESYREL) 100 MG tablet TAKE 1 TO2 TABLETS AT BEDTIME AS NEEDED FOR SLEEP. USE THE LOWEST DOSE THAT WORKS. (Patient taking differently: Take 200 mg by mouth at bedtime. USE THE LOWEST DOSE THAT WORKS.) 180 tablet 1   venlafaxine XR (EFFEXOR-XR) 75 MG 24 hr capsule 1 capsule in the morning and 2 capsules in the evening (Patient taking differently: Take 75-150 mg by mouth in the morning and at bedtime. 1 capsule in the morning and 2 capsules in the evening) 90 capsule 5   Budeson-Glycopyrrol-Formoterol (BREZTRI AEROSPHERE) 160-9-4.8 MCG/ACT AERO Inhale 2 puffs into the lungs in the morning and at bedtime. 10.7 g 2   Fluticasone-Umeclidin-Vilant (TRELEGY ELLIPTA) 100-62.5-25 MCG/ACT AEPB Inhale 1 puff into the lungs daily.  (Patient not taking: Reported on 10/24/2022) 60 each 3   loratadine (CLARITIN) 10 MG tablet Take 10 mg by mouth daily. (Patient not taking: Reported on 10/24/2022)     traMADol (ULTRAM) 50 MG tablet Take 50 mg by mouth daily as needed (migraine). (Patient not taking: Reported on 10/24/2022)     No facility-administered medications prior to visit.    Review of Systems  Constitutional:  Negative for chills, diaphoresis, fever, malaise/fatigue and weight loss.  HENT:  Negative for congestion.   Respiratory:  Positive for shortness of breath. Negative for cough, hemoptysis, sputum production and wheezing.   Cardiovascular:  Negative for chest pain, palpitations and leg swelling.  Objective:   Vitals:   11/07/22 1012  BP: 112/60  Pulse: 79  Resp: 14  SpO2: 92%  Weight: 153 lb (69.4 kg)  Height: 5\' 3"  (1.6 m)   SpO2: 92 %  Physical Exam: General: Chronically ill-appearing, no acute distress HENT: [place], AT Eyes: EOMI, no scleral icterus Respiratory: Clear to auscultation bilaterally.  No crackles, wheezing or rales Cardiovascular: RRR, -M/R/G, no JVD Extremities:-Edema,-tenderness Neuro: AAO x4, CNII-XII grossly intact Psych: Normal mood, normal affect  Data Reviewed:  Imaging: CT Chest 12/30/20 - Mild bronchiectasis with scattered mucoid and irritation CXR 10/02/22 - Small left pleural effusion with opacities/atelectasis  PFT: None on file  Labs: CBC    Component Value Date/Time   WBC 6.5 10/03/2022 0415   RBC 3.45 (L) 10/03/2022 0415   HGB 10.1 (L) 10/03/2022 0415   HGB 14.7 04/18/2022 1600   HCT 30.7 (L) 10/03/2022 0415   HCT 43.9 04/18/2022 1600   PLT 246 10/03/2022 0415   PLT 203 04/18/2022 1600   MCV 89.0 10/03/2022 0415   MCV 89 04/18/2022 1600   MCH 29.3 10/03/2022 0415   MCHC 32.9 10/03/2022 0415   RDW 14.1 10/03/2022 0415   RDW 12.9 04/18/2022 1600   LYMPHSABS 1.6 02/22/2021 1514   MONOABS 0.8 02/22/2021 1514   EOSABS 0.1 02/22/2021 1514   BASOSABS 0.0  02/22/2021 1514   BMET    Component Value Date/Time   NA 138 10/24/2022 1529   K 4.7 10/24/2022 1529   CL 99 10/24/2022 1529   CO2 24 10/24/2022 1529   GLUCOSE 139 (H) 10/24/2022 1529   GLUCOSE 135 (H) 10/03/2022 0415   BUN 24 10/24/2022 1529   CREATININE 1.40 (H) 10/24/2022 1529   CALCIUM 9.5 10/24/2022 1529   EGFR 39 (L) 10/24/2022 1529   GFRNONAA 37 (L) 10/03/2022 0415        Assessment & Plan:   Discussion: 77 year old female former with CAD, chronic systolic heart failure with normalized EF, DM2, HTN, depression and anxiety who presents for bronchiectasis follow-up. Hold on PFTs due to debilitation. Suspected her symptoms were not initially well controlled due to inefficient inhaler technique and doing much better with spacer. Resolved shortness of breath and wheezing. Completed physical therapy. Hospital course reviewed. No O2 required post-discharge.  Bronchiectasis Shortness of breath, wheezing - resolved --CONTINUE Breztri TWO puffs in the morning and evening. Use spacer --CONTINUE Albuterol TWO puffs as needed for shortness of breath or wheezing or chest tightness --Encouraged vaccines including influenza and RSV. Also discussed covid vaccine --Continue regular aerobic activity  Low-normal O2 --Consider ONO in the future if dyspnea worsens --Did not require oxygen at discharge  Heart failure exacerbation -resolved Echo with normal EF Euvolemic --Followed by Cardiology  Health Maintenance Immunization History  Administered Date(s) Administered   Influenza, High Dose Seasonal PF 12/31/2017   Influenza, Quadrivalent, Recombinant, Inj, Pf 01/22/2019   Zoster Recombinant(Shingrix) 07/30/2017, 01/19/2018   CT Lung Screen - Will re-enroll  No orders of the defined types were placed in this encounter.  Meds ordered this encounter  Medications   Budeson-Glycopyrrol-Formoterol (BREZTRI AEROSPHERE) 160-9-4.8 MCG/ACT AERO    Sig: Inhale 2 puffs into the lungs in  the morning and at bedtime.    Dispense:  10.7 g    Refill:  11    Order Specific Question:   Lot Number?    Answer:   [id number]    Order Specific Question:   Expiration Date?    Answer:   12/23/2024  Order Specific Question:   Manufacturer?    Answer:   [employer] [[id number]]    No follow-ups on file. See in November. If stable, spread to every 6 months  I have spent a total time of 30-minutes on the day of the appointment including chart review, data review, collecting history, coordinating care and discussing medical diagnosis and plan with the patient/family. Past medical history, allergies, medications were reviewed. Pertinent imaging, labs and tests included in this note have been reviewed and interpreted independently by me.  Lexey Fletes Mechele Collin, MD [facility] 11/07/2022 10:43 AM  Office Number [phone]

## 2022-11-30 NOTE — Progress Notes (Unsigned)
Cardiology Clinic Note   Date: 12/02/2022 ID: Sadonna, Royston 07/02/1945, MRN [id number]  Primary Cardiologist:  Reatha Harps, MD  Patient Profile    Melinda Gomez is a 77 y.o. female who presents to the clinic today for follow up after medication changes.     Past medical history significant for: Nonobstructive CAD. R/LHC 05/01/2021 (chronic systolic heart failure): Ostial RCA 30%. Chronic systolic heart failure. Echo 02/23/2021: EF 25 to 30%.  Global hypokinesis with septal dyskinesis.  Grade II DD.  Elevated LA pressure.  Normal RV function.  Moderately increased right ventricular wall thickness.  Mild MR. [facility] 05/01/2021: Normal LV filling pressure.  Upper normal right heart pressures.  PAP mean 23 mmHg.  Normal cardiac output index 2.86. Echo 05/14/2022: EF 50 to 55%.  No RWMA.  Indeterminate diastolic parameters.  Normal RV size/function.  Mild calcification of aortic valve without stenosis. Hypertension. Hyperlipidemia. Lipid panel 05/16/2022: LDL 61, HDL 47, TG 157, total 134.  COPD. GERD. Stroke. GAD. CKD stage IIIb. Former tobacco abuse.     History of Present Illness    Melinda Gomez was first evaluated by cardiology on 02/23/2021 for chest pain and shortness of breath during hospital admission.  EKG showed sinus rhythm with LBBB.  Troponin elevated but flat.  BNP 226.  Echo showed EF 25 to 30%.  [facility] February 2023 showed minimal nonobstructive CAD, normal LV filling pressures, upper normal right heart pressures.  Repeat echo February 2024 showed EF 50 to 55%, normal RV size/function.  Patient was last seen in the office by Dr. Flora Lipps on 10/24/2022 for hospital follow-up.  She had recently been admitted for pneumonia.  There was concern for CHF but BNP was normal.  Entresto was decreased secondary to hypotension although it was felt this could be secondary to dehydration.  Today, patient is accompanied by her husband and daughter. She denies chest pain, tightness or  pressure. No shortness of breath at rest. She does get dyspneic at times when she is walking. She denies lower extremity edema. Daily weight is stable. Last dose of Lasix beginning of August. Per daughter she is mostly sedentary only walking around the house. Patient states she performs home PT exercises about every other day. Husband reports occasional episodes of being unsteady on her feet. No recent falls. She takes occasional xanax for anxiety which causes drowsiness and more unsteadiness. She ambulates with a walker at home. Suggested a rollator with a seat so if she needs to sit down as she is ambulating she can. Family is in agreement. She reports a 1 month history of loose stool. She does eat a fair amount of fried foods (she does not have a gall bladder) but does not think this increases episodes. Encouraged her to follow up with PCP.    ROS: All other systems reviewed and are otherwise negative except as noted in History of Present Illness.  Studies Reviewed      EKG is not ordered today.       Physical Exam    VS:  BP (!) 140/72   Pulse 63   Ht 5\' 2"  (1.575 m)   Wt 157 lb 3.2 oz (71.3 kg)   SpO2 94%   BMI 28.75 kg/m  , BMI Body mass index is 28.75 kg/m.  GEN: Well nourished, well developed, in no acute distress. Neck: No JVD or carotid bruits. Cardiac:  RRR. No murmurs. No rubs or gallops.   Respiratory:  Respirations regular and unlabored.  Clear to auscultation without rales, wheezing or rhonchi. GI: Soft, nontender, nondistended. Extremities: Radials/DP/PT 2+ and equal bilaterally. No clubbing or cyanosis. No edema.  Skin: Warm and dry, no rash. Neuro: Strength intact.  Assessment & Plan    Nonobstructive CAD.  [facility] February 2023 showed ostial RCA 30%. Patient denies chest pain, pressure, tightness. She reports performing home exercises she learned in PT every other day. Continue Toprol, rosuvastatin. She is encouraged to increase physical activity as tolerated.   Chronic systolic heart failure.  Echo December 2022 showed EF 25 to 30%.  [facility] February 2023 showed normal LV filling pressure and upper normal right heart pressures.  Echo February 2024 showed EF 50 to 55%.  Patient denies lower extremity edema. She does have occasional dyspnea. Daily weight stable. Last Lasix dose beginning of August. Euvolemic and well compensated on exam.  Continue spironolactone, Entresto, metoprolol, Farxiga, Lasix as needed. Hyperlipidemia. Ldl February 2024 61, at goal. Continue rosuvastatin.  Hypertension. BP today 140/72 on intake and 136/60 on my recheck. Patient denies headaches, dizziness or vision changes. Continue spironolactone, Entresto, Toprol. Will get BMP and mg today.   Disposition: BMP and Mg today. Return for previously scheduled visit with Dr. Flora Lipps on 11/15 or sooner as needed.          Signed, Etta Grandchild. Flordia Kassem, DNP, NP-C

## 2022-12-02 ENCOUNTER — Ambulatory Visit: Payer: PPO | Attending: Student | Admitting: Student

## 2022-12-02 ENCOUNTER — Encounter: Payer: Self-pay | Admitting: Student

## 2022-12-02 VITALS — BP 136/60 | HR 63 | Ht 62.0 in | Wt 157.2 lb

## 2022-12-02 DIAGNOSIS — I10 Essential (primary) hypertension: Secondary | ICD-10-CM

## 2022-12-02 DIAGNOSIS — Z79899 Other long term (current) drug therapy: Secondary | ICD-10-CM | POA: Diagnosis not present

## 2022-12-02 DIAGNOSIS — I5022 Chronic systolic (congestive) heart failure: Secondary | ICD-10-CM

## 2022-12-02 DIAGNOSIS — I2510 Atherosclerotic heart disease of native coronary artery without angina pectoris: Secondary | ICD-10-CM | POA: Diagnosis not present

## 2022-12-02 DIAGNOSIS — E785 Hyperlipidemia, unspecified: Secondary | ICD-10-CM

## 2022-12-02 NOTE — Patient Instructions (Signed)
Medication Instructions:  No medication changes on today's encounter *If you need a refill on your cardiac medications before your next appointment, please call your pharmacy*   Lab Work: BMP, MAGNESIUM If you have labs (blood work) drawn today and your tests are completely normal, you will receive your results only by: MyChart Message (if you have MyChart) OR A paper copy in the mail If you have any lab test that is abnormal or we need to change your treatment, we will call you to review the results.   Testing/Procedures: No test or procedures ordered on today's encounter   Follow-Up: At [facility], you and your health needs are our priority.  As part of our continuing mission to provide you with exceptional heart care, we have created designated Provider Care Teams.  These Care Teams include your primary Cardiologist (physician) and Advanced Practice Providers (APPs -  Physician Assistants and Nurse Practitioners) who all work together to provide you with the care you need, when you need it.  We recommend signing up for the patient portal called "MyChart".  Sign up information is provided on this After Visit Summary.  MyChart is used to connect with patients for Virtual Visits (Telemedicine).  Patients are able to view lab/test results, encounter notes, upcoming appointments, etc.  Non-urgent messages can be sent to your provider as well.   To learn more about what you can do with MyChart, go to [email or web address].

## 2022-12-03 LAB — MAGNESIUM: Magnesium: 2 mg/dL (ref 1.6–2.3)

## 2022-12-03 LAB — BASIC METABOLIC PANEL
BUN/Creatinine Ratio: 13 (ref 12–28)
BUN: 14 mg/dL (ref 8–27)
CO2: 25 mmol/L (ref 20–29)
Calcium: 9.9 mg/dL (ref 8.7–10.3)
Chloride: 100 mmol/L (ref 96–106)
Creatinine, Ser: 1.11 mg/dL — ABNORMAL HIGH (ref 0.57–1.00)
Glucose: 111 mg/dL — ABNORMAL HIGH (ref 70–99)
Potassium: 4.7 mmol/L (ref 3.5–5.2)
Sodium: 138 mmol/L (ref 134–144)
eGFR: 52 mL/min/{1.73_m2} — ABNORMAL LOW (ref >59)

## 2023-01-12 ENCOUNTER — Other Ambulatory Visit: Payer: Self-pay | Admitting: Psychiatry

## 2023-01-12 DIAGNOSIS — F4310 Post-traumatic stress disorder, unspecified: Secondary | ICD-10-CM

## 2023-01-12 DIAGNOSIS — F22 Delusional disorders: Secondary | ICD-10-CM

## 2023-01-12 DIAGNOSIS — F331 Major depressive disorder, recurrent, moderate: Secondary | ICD-10-CM

## 2023-01-13 NOTE — Telephone Encounter (Signed)
Appt tomorrow.

## 2023-01-14 ENCOUNTER — Ambulatory Visit: Payer: PPO | Admitting: Psychiatry

## 2023-01-14 ENCOUNTER — Encounter: Payer: Self-pay | Admitting: Psychiatry

## 2023-01-14 DIAGNOSIS — F4310 Post-traumatic stress disorder, unspecified: Secondary | ICD-10-CM

## 2023-01-14 DIAGNOSIS — F331 Major depressive disorder, recurrent, moderate: Secondary | ICD-10-CM

## 2023-01-14 DIAGNOSIS — F5105 Insomnia due to other mental disorder: Secondary | ICD-10-CM | POA: Diagnosis not present

## 2023-01-14 DIAGNOSIS — F22 Delusional disorders: Secondary | ICD-10-CM
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
## 2023-01-21 ENCOUNTER — Telehealth: Payer: PPO | Admitting: Psychiatry

## 2023-01-23 ENCOUNTER — Inpatient Hospital Stay (HOSPITAL_COMMUNITY)
Admission: EM | Admit: 2023-01-23 | Discharge: 2023-01-31 | DRG: 065 | Disposition: A | Discharge status: Skilled Nursing Facility | Payer: PPO | Attending: Internal Medicine | Admitting: Internal Medicine

## 2023-01-23 ENCOUNTER — Observation Stay (HOSPITAL_COMMUNITY): Payer: PPO

## 2023-01-23 ENCOUNTER — Emergency Department (HOSPITAL_COMMUNITY): Payer: PPO

## 2023-01-23 ENCOUNTER — Encounter (HOSPITAL_COMMUNITY): Payer: Self-pay | Admitting: Family Medicine

## 2023-01-23 DIAGNOSIS — F4310 Post-traumatic stress disorder, unspecified: Secondary | ICD-10-CM

## 2023-01-23 DIAGNOSIS — R414 Neurologic neglect syndrome: Secondary | ICD-10-CM | POA: Diagnosis present

## 2023-01-23 DIAGNOSIS — I10 Essential (primary) hypertension: Secondary | ICD-10-CM | POA: Diagnosis not present

## 2023-01-23 DIAGNOSIS — Z79899 Other long term (current) drug therapy: Secondary | ICD-10-CM

## 2023-01-23 DIAGNOSIS — F411 Generalized anxiety disorder: Secondary | ICD-10-CM

## 2023-01-23 DIAGNOSIS — R1310 Dysphagia, unspecified: Secondary | ICD-10-CM | POA: Diagnosis present

## 2023-01-23 DIAGNOSIS — I5022 Chronic systolic (congestive) heart failure: Secondary | ICD-10-CM | POA: Diagnosis not present

## 2023-01-23 DIAGNOSIS — G4720 Circadian rhythm sleep disorder, unspecified type: Secondary | ICD-10-CM

## 2023-01-23 DIAGNOSIS — I63511 Cerebral infarction due to unspecified occlusion or stenosis of right middle cerebral artery: Principal | ICD-10-CM

## 2023-01-23 DIAGNOSIS — R509 Fever, unspecified: Secondary | ICD-10-CM | POA: Diagnosis present

## 2023-01-23 DIAGNOSIS — I6521 Occlusion and stenosis of right carotid artery: Secondary | ICD-10-CM | POA: Diagnosis not present

## 2023-01-23 DIAGNOSIS — I639 Cerebral infarction, unspecified: Principal | ICD-10-CM

## 2023-01-23 DIAGNOSIS — Z888 Allergy status to other drugs, medicaments and biological substances status: Secondary | ICD-10-CM

## 2023-01-23 DIAGNOSIS — G8194 Hemiplegia, unspecified affecting left nondominant side: Secondary | ICD-10-CM | POA: Diagnosis present

## 2023-01-23 DIAGNOSIS — R471 Dysarthria and anarthria: Secondary | ICD-10-CM | POA: Diagnosis present

## 2023-01-23 DIAGNOSIS — Z87891 Personal history of nicotine dependence: Secondary | ICD-10-CM

## 2023-01-23 DIAGNOSIS — E7800 Pure hypercholesterolemia, unspecified: Secondary | ICD-10-CM

## 2023-01-23 DIAGNOSIS — Z823 Family history of stroke: Secondary | ICD-10-CM

## 2023-01-23 DIAGNOSIS — Z66 Do not resuscitate: Secondary | ICD-10-CM | POA: Diagnosis present

## 2023-01-23 DIAGNOSIS — Z7982 Long term (current) use of aspirin: Secondary | ICD-10-CM

## 2023-01-23 DIAGNOSIS — Z7902 Long term (current) use of antithrombotics/antiplatelets: Secondary | ICD-10-CM

## 2023-01-23 DIAGNOSIS — F5105 Insomnia due to other mental disorder: Secondary | ICD-10-CM

## 2023-01-23 DIAGNOSIS — R29810 Facial weakness: Secondary | ICD-10-CM | POA: Diagnosis present

## 2023-01-23 DIAGNOSIS — J449 Chronic obstructive pulmonary disease, unspecified: Secondary | ICD-10-CM

## 2023-01-23 DIAGNOSIS — J9819 Other pulmonary collapse: Secondary | ICD-10-CM | POA: Diagnosis present

## 2023-01-23 DIAGNOSIS — Z7951 Long term (current) use of inhaled steroids: Secondary | ICD-10-CM

## 2023-01-23 DIAGNOSIS — K219 Gastro-esophageal reflux disease without esophagitis: Secondary | ICD-10-CM | POA: Diagnosis present

## 2023-01-23 DIAGNOSIS — I110 Hypertensive heart disease with heart failure: Secondary | ICD-10-CM | POA: Diagnosis present

## 2023-01-23 DIAGNOSIS — I2510 Atherosclerotic heart disease of native coronary artery without angina pectoris: Secondary | ICD-10-CM | POA: Diagnosis present

## 2023-01-23 DIAGNOSIS — R29710 NIHSS score 10: Secondary | ICD-10-CM | POA: Diagnosis present

## 2023-01-23 LAB — APTT: aPTT: 24 s (ref 24–36)

## 2023-01-23 LAB — I-STAT CHEM 8, ED
BUN: 17 mg/dL (ref 8–23)
Calcium, Ion: 1.05 mmol/L — ABNORMAL LOW (ref 1.15–1.40)
Chloride: 112 mmol/L — ABNORMAL HIGH (ref 98–111)
Creatinine, Ser: 1.2 mg/dL — ABNORMAL HIGH (ref 0.44–1.00)
Glucose, Bld: 135 mg/dL — ABNORMAL HIGH (ref 70–99)
HCT: 41 % (ref 36.0–46.0)
Hemoglobin: 13.9 g/dL (ref 12.0–15.0)
Potassium: 4.1 mmol/L (ref 3.5–5.1)
Sodium: 137 mmol/L (ref 135–145)
TCO2: 17 mmol/L — ABNORMAL LOW (ref 22–32)

## 2023-01-23 LAB — DIFFERENTIAL
Abs Immature Granulocytes: 0.02 10*3/uL (ref 0.00–0.07)
Basophils Absolute: 0 10*3/uL (ref 0.0–0.1)
Basophils Relative: 0 %
Eosinophils Absolute: 0.1 10*3/uL (ref 0.0–0.5)
Eosinophils Relative: 2 %
Immature Granulocytes: 0 %
Lymphocytes Relative: 21 %
Lymphs Abs: 1.8 10*3/uL (ref 0.7–4.0)
Monocytes Absolute: 0.7 10*3/uL (ref 0.1–1.0)
Monocytes Relative: 8 %
Neutro Abs: 6.2 10*3/uL (ref 1.7–7.7)
Neutrophils Relative %: 69 %

## 2023-01-23 LAB — CBC
HCT: 44.9 % (ref 36.0–46.0)
Hemoglobin: 14.2 g/dL (ref 12.0–15.0)
MCH: 28.5 pg (ref 26.0–34.0)
MCHC: 31.6 g/dL (ref 30.0–36.0)
MCV: 90.2 fL (ref 80.0–100.0)
Platelets: 198 10*3/uL (ref 150–400)
RBC: 4.98 MIL/uL (ref 3.87–5.11)
RDW: 13.7 % (ref 11.5–15.5)
WBC: 8.9 10*3/uL (ref 4.0–10.5)
nRBC: 0 % (ref 0.0–0.2)

## 2023-01-23 LAB — COMPREHENSIVE METABOLIC PANEL
ALT: 23 U/L (ref 0–44)
AST: 20 U/L (ref 15–41)
Albumin: 4 g/dL (ref 3.5–5.0)
Alkaline Phosphatase: 59 U/L (ref 38–126)
Anion gap: 10 (ref 5–15)
BUN: 16 mg/dL (ref 8–23)
CO2: 16 mmol/L — ABNORMAL LOW (ref 22–32)
Calcium: 9.1 mg/dL (ref 8.9–10.3)
Chloride: 110 mmol/L (ref 98–111)
Creatinine, Ser: 1.21 mg/dL — ABNORMAL HIGH (ref 0.44–1.00)
GFR, Estimated: 46 mL/min — ABNORMAL LOW (ref >60)
Glucose, Bld: 142 mg/dL — ABNORMAL HIGH (ref 70–99)
Potassium: 4.1 mmol/L (ref 3.5–5.1)
Sodium: 136 mmol/L (ref 135–145)
Total Bilirubin: 0.3 mg/dL (ref 0.3–1.2)
Total Protein: 7.2 g/dL (ref 6.5–8.1)

## 2023-01-23 LAB — CBG MONITORING, ED: Glucose-Capillary: 153 mg/dL — ABNORMAL HIGH (ref 70–99)

## 2023-01-23 LAB — PROTIME-INR
INR: 1 (ref 0.8–1.2)
Prothrombin Time: 13.3 s (ref 11.4–15.2)

## 2023-01-23 LAB — ETHANOL: Alcohol, Ethyl (B): <10 mg/dL (ref <10)

## 2023-01-23 MED ORDER — CHOLESTYRAMINE 4 G PO PACK: 4.0000 g | PACK | Freq: Every day | ORAL | Status: DC | PRN

## 2023-01-23 MED ORDER — SODIUM CHLORIDE 0.9 % IV SOLN: INTRAVENOUS | Status: DC

## 2023-01-23 MED ORDER — STROKE: EARLY STAGES OF RECOVERY BOOK
Freq: Once | Status: AC
  Filled 2023-01-23: qty 1

## 2023-01-23 MED ORDER — ALBUTEROL SULFATE HFA 108 (90 BASE) MCG/ACT IN AERS: 2.0000 | INHALATION_SPRAY | RESPIRATORY_TRACT | Status: DC | PRN

## 2023-01-23 MED ORDER — HEPARIN SODIUM (PORCINE) 5000 UNIT/ML IJ SOLN
5000.0000 [IU] | Freq: Three times a day (TID) | INTRAMUSCULAR | Status: DC
  Administered 2023-01-24 – 2023-01-31 (×22): 5000 [IU] via SUBCUTANEOUS
  Filled 2023-01-23 (×21): qty 1

## 2023-01-23 MED ORDER — DIPHENHYDRAMINE HCL 50 MG/ML IJ SOLN
25.0000 mg | Freq: Once | INTRAMUSCULAR | Status: AC
  Administered 2023-01-23: 25 mg via INTRAVENOUS
  Filled 2023-01-23: qty 1

## 2023-01-23 MED ORDER — SENNOSIDES-DOCUSATE SODIUM 8.6-50 MG PO TABS: 1.0000 | ORAL_TABLET | Freq: Every evening | ORAL | Status: DC | PRN

## 2023-01-23 MED ORDER — VENLAFAXINE HCL ER 75 MG PO CP24
75.0000 mg | ORAL_CAPSULE | Freq: Every day | ORAL | Status: DC
  Administered 2023-01-27 – 2023-01-31 (×5): 75 mg via ORAL
  Filled 2023-01-23 (×6): qty 1

## 2023-01-23 MED ORDER — ALPRAZOLAM 0.5 MG PO TABS
0.5000 mg | ORAL_TABLET | Freq: Three times a day (TID) | ORAL | Status: DC | PRN
  Administered 2023-01-26 – 2023-01-31 (×2): 0.5 mg via ORAL
  Filled 2023-01-23 (×2): qty 1

## 2023-01-23 MED ORDER — PROCHLORPERAZINE EDISYLATE 10 MG/2ML IJ SOLN
10.0000 mg | Freq: Once | INTRAMUSCULAR | Status: AC
  Administered 2023-01-23: 10 mg via INTRAVENOUS
  Filled 2023-01-23: qty 2

## 2023-01-23 MED ORDER — FLUTICASONE FUROATE-VILANTEROL 100-25 MCG/ACT IN AEPB
1.0000 | INHALATION_SPRAY | Freq: Every day | RESPIRATORY_TRACT | Status: DC
  Administered 2023-01-24 – 2023-01-31 (×7): 1 via RESPIRATORY_TRACT
  Filled 2023-01-23: qty 28

## 2023-01-23 MED ORDER — IOHEXOL 350 MG/ML SOLN
80.0000 mL | Freq: Once | INTRAVENOUS | Status: AC | PRN
  Administered 2023-01-23: 80 mL via INTRAVENOUS

## 2023-01-23 MED ORDER — BUDESON-GLYCOPYRROL-FORMOTEROL 160-9-4.8 MCG/ACT IN AERO: 2.0000 | INHALATION_SPRAY | Freq: Two times a day (BID) | RESPIRATORY_TRACT | Status: DC

## 2023-01-23 MED ORDER — ACETAMINOPHEN 160 MG/5ML PO SOLN
650.0000 mg | ORAL | Status: DC | PRN
  Administered 2023-01-25: 650 mg

## 2023-01-23 MED ORDER — RISPERIDONE 1 MG PO TABS: 1.0000 mg | ORAL_TABLET | Freq: Every day | ORAL | Status: DC

## 2023-01-23 MED ORDER — ACETAMINOPHEN 325 MG PO TABS
650.0000 mg | ORAL_TABLET | ORAL | Status: DC | PRN
  Administered 2023-01-26 – 2023-01-28 (×4): 650 mg via ORAL
  Filled 2023-01-23 (×5): qty 2

## 2023-01-23 MED ORDER — ASPIRIN 81 MG PO TBEC
81.0000 mg | DELAYED_RELEASE_TABLET | Freq: Every day | ORAL | Status: DC
  Administered 2023-01-27 – 2023-01-31 (×5): 81 mg via ORAL
  Filled 2023-01-23 (×6): qty 1

## 2023-01-23 MED ORDER — UMECLIDINIUM BROMIDE 62.5 MCG/ACT IN AEPB
1.0000 | INHALATION_SPRAY | Freq: Every day | RESPIRATORY_TRACT | Status: DC
  Administered 2023-01-24 – 2023-01-31 (×7): 1 via RESPIRATORY_TRACT
  Filled 2023-01-23: qty 7

## 2023-01-23 MED ORDER — ROSUVASTATIN CALCIUM 20 MG PO TABS
20.0000 mg | ORAL_TABLET | Freq: Every day | ORAL | Status: DC
  Administered 2023-01-27 – 2023-01-31 (×5): 20 mg via ORAL
  Filled 2023-01-23 (×6): qty 1

## 2023-01-23 MED ORDER — ACETAMINOPHEN 650 MG RE SUPP
650.0000 mg | RECTAL | Status: DC | PRN
  Administered 2023-01-25: 650 mg via RECTAL
  Filled 2023-01-23 (×2): qty 1

## 2023-01-23 MED ORDER — DEXTROSE-SODIUM CHLORIDE 5-0.45 % IV SOLN: INTRAVENOUS | Status: AC

## 2023-01-23 NOTE — Assessment & Plan Note (Signed)
-   Continue statin, holding Entresto and spironolactone in the setting of permissive hypertension

## 2023-01-23 NOTE — ED Notes (Signed)
Patient given pillow to prop her head up
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
## 2023-01-23 NOTE — ED Triage Notes (Signed)
Pt arrived via [facility] c/o stroke like Sx, total L sided weakness, L sided facial droop, speech is clear, MD made aware, LKW a little after 12pm today. Cbg 153. Pt at CT now

## 2023-01-23 NOTE — Consult Note (Addendum)
[facility] Telemedicine Consult   Requesting Provider: Dr Posey Rea Consult Participants: Dr. Marthe Patch, Telespecialist RN Berna Spare   Bedside RN Lequita Halt Location of the provider: Home   Location of the patient: [facility]  This consult was provided via telemedicine with 2-way video and audio communication. The patient/family was informed that care would be provided in this way and agreed to receive care in this manner.   Chief Complaint: left facial droop, left sided weakness, left sided neglect  HPI: 77 year old woman past history of CHF, COPD, coronary artery disease, hypertension, hyperlipidemia, at baseline requiring help with ADLs 24/7, using a walker to walk even inside the house and needs help with bathing, toileting etc. was brought in for evaluation of sudden onset of left-sided weakness.  Last known well was 12 PM when they had lunch together.  Then the husband and the patient took a nap and upon waking up he noted that she was not acting normally.  She had asked him to hand something over but her left side was weak.  EMS was called.  They noted left-sided weakness and brought her to the hospital. I am not sure why a LVO positive stroke protocol was followed and patient taken to [facility] but nonetheless I evaluated her on the camera.  She had a dense MCA on the right and a CT angio and perfusion showed a right carotid and right MCA occlusion with a fetal PCA and possibly involvement of the P-comm.  58 cc core with 101 cc mismatch volume/penumbra.  Discussed the fact that the poor functional status and large stroke make thrombectomy less likely to be of benefit and more harm and the husband agreed.  She will be admitted for stroke workup at [facility]   Past Medical History:  Diagnosis Date   CHF (congestive heart failure) (HCC)    COPD (chronic obstructive pulmonary disease) (HCC)    Coronary artery disease    GAD (generalized anxiety disorder)    GERD  (gastroesophageal reflux disease)    Hypercholesteremia    Hypertension     No current facility-administered medications for this encounter.  Current Outpatient Medications:    albuterol (VENTOLIN HFA) 108 (90 Base) MCG/ACT inhaler, Inhale 2 puffs into the lungs every 4 (four) hours as needed for shortness of breath or wheezing., Disp: 18 g, Rfl: 2   ALPRAZolam (XANAX) 0.5 MG tablet, TAKE ONE TABLET BY MOUTH 3 TIMES DAILY AS NEEDED FOR ANXIETY. (Patient taking differently: Take 0.5 mg by mouth 3 (three) times daily as needed for anxiety.), Disp: 90 tablet, Rfl: 5   Budeson-Glycopyrrol-Formoterol (BREZTRI AEROSPHERE) 160-9-4.8 MCG/ACT AERO, Inhale 2 puffs into the lungs in the morning and at bedtime., Disp: 10.7 g, Rfl: 11   cetirizine (ZYRTEC) 10 MG tablet, Take 10 mg by mouth daily., Disp: , Rfl:    Cholecalciferol (VITAMIN D3) 125 MCG (5000 UT) TABS, Take 5,000 Units by mouth daily., Disp: , Rfl:    cholestyramine (QUESTRAN) 4 GM/DOSE powder, Take 4 g by mouth See admin instructions. 13 grams added to 2-6 oz of water as needed, Disp: , Rfl:    cyclobenzaprine (FLEXERIL) 10 MG tablet, Take 10 mg by mouth as needed for muscle spasms. (Patient not taking: Reported on 01/14/2023), Disp: , Rfl:    dapagliflozin propanediol (FARXIGA) 10 MG TABS tablet, Take 1 tablet (10 mg total) by mouth daily before breakfast., Disp: 90 tablet, Rfl: 3   furosemide (LASIX) 40 MG tablet, Take 1 tablet (40 mg  total) by mouth as needed for edema., Disp: 90 tablet, Rfl: 3   metoprolol succinate (TOPROL-XL) 50 MG 24 hr tablet, Take 1 tablet (50 mg total) by mouth daily for heart and blood pressure. Take with or immediately following a meal., Disp: 90 tablet, Rfl: 3   promethazine (PHENERGAN) 12.5 MG tablet, Take 12.5 mg by mouth every 6 (six) hours as needed for nausea. (Patient not taking: Reported on 01/14/2023), Disp: , Rfl:    risperiDONE (RISPERDAL) 1 MG tablet, TAKE 1 TABLET BY MOUTH AT BEDTIME FOR MOOD., Disp: 30  tablet, Rfl: 5   rizatriptan (MAXALT) 5 MG tablet, Take 5 mg by mouth as needed for migraine., Disp: , Rfl:    rosuvastatin (CRESTOR) 20 MG tablet, Take 20 mg by mouth daily., Disp: , Rfl:    sacubitril-valsartan (ENTRESTO) 24-26 MG, Take 1 tablet by mouth 2 (two) times daily., Disp: 180 tablet, Rfl: 3   spironolactone (ALDACTONE) 25 MG tablet, TAKE 1/2 TABLET BY MOUTH DAILY FOR FLUID. (Patient taking differently: Take 12.5 mg by mouth daily.), Disp: 45 tablet, Rfl: 2   traZODone (DESYREL) 100 MG tablet, TAKE 1 TO2 TABLETS AT BEDTIME AS NEEDED FOR SLEEP. USE THE LOWEST DOSE THAT WORKS. (Patient taking differently: Take 200 mg by mouth at bedtime. USE THE LOWEST DOSE THAT WORKS.), Disp: 180 tablet, Rfl: 1   venlafaxine XR (EFFEXOR-XR) 75 MG 24 hr capsule, 1 capsule in the morning and 2 capsules in the evening (Patient taking differently: Take 75-150 mg by mouth in the morning and at bedtime. 1 capsule in the morning and 2 capsules in the evening), Disp: 90 capsule, Rfl: 5    LKW: 12 PM IV thrombolysis given?: No, outside the window IR Thrombectomy? No, poor modified Rankin of 4-5 Modified Rankin Scale: 4-Needs assistance to walk and tend to bodily needs Time of teleneurologist evaluation: 1744 hrs.  Exam: Vitals:   01/23/23 1809 01/23/23 1812  BP:    Pulse: 89   Resp: 18   Temp:  98.2 F (36.8 C)  SpO2: 96%     General: She is awake alert in no distress Neurological exam She is awake alert oriented x 3.  Speech is moderately dysarthric.  No evidence of aphasia. Cranial nerve examination: Pupils are equal round react light, extraocular movements are restricted with rightward gaze preference and inability to cross the midline to the left.  Visual field cut on the left.  Left lower facial weakness. Motor examination with flaccid left upper extremity.  Left lower extremity has mild drift.  Right side is full strength Sensation diminished on the left with extinction. Coordination intact on  the right   NIHSS 1A: Level of Consciousness - 0 1B: Ask Month and Age - 0 1C: 'Blink Eyes' & 'Squeeze Hands' - 0 2: Test Horizontal Extraocular Movements - 1 3: Test Visual Fields - 2 4: Test Facial Palsy - 2 5A: Test Left Arm Motor Drift - 4 5B: Test Right Arm Motor Drift - 0 6A: Test Left Leg Motor Drift - 1 6B: Test Right Leg Motor Drift - 0 7: Test Limb Ataxia - 0 8: Test Sensation - 2 9: Test Language/Aphasia- 0 10: Test Dysarthria - 1 11: Test Extinction/Inattention - 2 NIHSS score: 15   Imaging Reviewed: CT head with dense MCA on the right.  CT angiography head and neck with occluded right ICA and right MCA along with possible clot extension into the right P-comm with a fetal PCA.  CT perfusion study with a  58 cc core and 101 cc penumbra with a mismatch volume of 2.7  Labs reviewed in epic and pertinent values follow: CBC    Component Value Date/Time   WBC 8.9 01/23/2023 1739   RBC 4.98 01/23/2023 1739   HGB 13.9 01/23/2023 1747   HGB 14.7 04/18/2022 1600   HCT 41.0 01/23/2023 1747   HCT 43.9 04/18/2022 1600   PLT 198 01/23/2023 1739   PLT 203 04/18/2022 1600   MCV 90.2 01/23/2023 1739   MCV 89 04/18/2022 1600   MCH 28.5 01/23/2023 1739   MCHC 31.6 01/23/2023 1739   RDW 13.7 01/23/2023 1739   RDW 12.9 04/18/2022 1600   LYMPHSABS 1.8 01/23/2023 1739   MONOABS 0.7 01/23/2023 1739   EOSABS 0.1 01/23/2023 1739   BASOSABS 0.0 01/23/2023 1739   CMP     Component Value Date/Time   NA 137 01/23/2023 1747   NA 138 12/02/2022 1236   K 4.1 01/23/2023 1747   CL 112 (H) 01/23/2023 1747   CO2 16 (L) 01/23/2023 1739   GLUCOSE 135 (H) 01/23/2023 1747   BUN 17 01/23/2023 1747   BUN 14 12/02/2022 1236   CREATININE 1.20 (H) 01/23/2023 1747   CALCIUM 9.1 01/23/2023 1739   PROT 7.2 01/23/2023 1739   PROT 6.8 04/18/2022 1600   ALBUMIN 4.0 01/23/2023 1739   ALBUMIN 4.6 04/18/2022 1600   AST 20 01/23/2023 1739   ALT 23 01/23/2023 1739   ALKPHOS 59 01/23/2023 1739    BILITOT 0.3 01/23/2023 1739   BILITOT <0.2 04/18/2022 1600   GFRNONAA 46 (L) 01/23/2023 1739   GFRAA  07/30/2008 0945    >60        The eGFR has been calculated using the MDRD equation. This calculation has not been validated in all clinical situations. eGFR's persistently <60 mL/min signify possible Chronic Kidney Disease.     Assessment: 77 year old woman past history of CHF, COPD, coronary artery disease, gastroesophageal reflux disease, hypertension, hyperlipidemia senting for evaluation of symptoms consistent with a right MCA syndrome.  Imaging in the form of CT head and CT angiography head and neck reveals a right internal carotid and right MCA occlusion along with possible right P-comm occlusion due to a fetal PCA. She is outside the window for IV TNKase She is at baseline somebody was modified Rankin score of 4-5 requiring 24/7 assistance with ADLs. I discussed the risks, benefits and alternatives of thrombectomy and the fact that her advanced age, decent size score and her extremely poor baseline put her at a very large risk for thrombectomy and she is not a good candidate.  The husband agreed not to proceed with thrombectomy She will be admitted for stroke workup at [facility].  Recommendations:  Admit to hospitalist Frequent neurochecks Telemetry Aspirin 81 High intensity statin for goal LDL less than 70 2D echo A1c Lipid panel N.p.o. until cleared by bedside stroke swallow evaluation or formal speech therapy evaluation Permissive hypertension-allow for blood pressures to be high-do not treat unless the systolic goes above 220 and if that happens, treat the blood pressure only as needed. Can start normalizing blood pressures in 48 to 72 hours. She will likely need extensive rehab and may be SNF placement.  PT, OT, speech therapy evaluations need to be performed.  Again, the patient has a profile for revascularization that is favorable based on the perfusion  metrics but her extremely poor baseline and relatively large core of up to 58 cc seen on perfusion study make  her an extremely poor candidate for thrombectomy.  This was discussed in detail with the husband and daughter Efraim Kaufmann.  I explained to them that she would be at very high risk for complications such as hemorrhage and after discussion decision was made to not pursue thrombectomy.  Plan was discussed with her husband, daughter at bedside.  Plan was discussed with Dr. Lucianne Muss over the camera and then again over the phone.  Please call the on-call neurologist rounding tomorrow for follow-up on this consultation.  -- Milon Dikes, MD Neurologist [facility] Pager: [phone]  CRITICAL CARE ATTESTATION Performed by: Milon Dikes, MD Total critical care time: 39 minutes Critical care time was exclusive of separately billable procedures and treating other patients and/or supervising APPs/Residents/Students Critical care was necessary to treat or prevent imminent or life-threatening deterioration. This patient is critically ill and at significant risk for neurological worsening and/or death and care requires constant monitoring. Critical care was time spent personally by me on the following activities: development of treatment plan with patient and/or surrogate as well as nursing, discussions with consultants, evaluation of patient's response to treatment, examination of patient, obtaining history from patient or surrogate, ordering and performing treatments and interventions, ordering and review of laboratory studies, ordering and review of radiographic studies, pulse oximetry, re-evaluation of patient's condition, participation in multidisciplinary rounds and medical decision making of high complexity in the care of this patient.

## 2023-01-23 NOTE — Assessment & Plan Note (Signed)
-   Holding spironolactone, metoprolol Entresto for permissive hypertension

## 2023-01-23 NOTE — Assessment & Plan Note (Signed)
Continue statin. 

## 2023-01-23 NOTE — Assessment & Plan Note (Signed)
Continue albuterol and Breztri.

## 2023-01-23 NOTE — Assessment & Plan Note (Signed)
-   Continue Xanax 

## 2023-01-23 NOTE — ED Notes (Signed)
Pt taken to MRI  

## 2023-01-23 NOTE — H&P (Signed)
History and Physical    Patient: Melinda Gomez:[id number] DOB: [birth year] DOA: 01/23/2023 DOS: the patient was seen and examined on 01/23/2023 PCP: Thana Ates, MD  Patient coming from: Home  Chief Complaint:  Chief Complaint  Patient presents with   Code Stroke   HPI: Melinda Gomez is a 77 y.o. female with medical history significant of CHF, COPD, CAD, GAD, GERD, hyperlipidemia, hypertension, presents to the ED with a chief complaint of difficulty swallowing.  Husband is patient's primary caretaker.  He reports he last saw her mid afternoon when he went out to work in the yard.  When he came back and she was complaining of a headache and asked for her rizatriptan.  It was the first time she had asked for it in 3 days.  He noted something was wrong when she could not pick it up with her hand.  Her left hand was not moving at all and her right hand could not get coordinated enough to pick it up.  He put in her mouth give her some Pepsi and she had a lot of difficulty swallowing it.  He does think she got it down.  After that he noted that her speech was not clear.  She had a left-sided droop as well.  He reports that prior to that she had been doing very well.  She even ambulated on her own to the bathroom and mostly took care of that herself except for redressing after using the toilet.  She has not been complaining of anything to him.  He called EMS who advised him to evaluate her pronator drift.  She could not lift her left arm.  They brought her in for stroke evaluation.  Unfortunately, patient is not able to provide much history as she is quite somnolent after her headache cocktail.  Patient does not smoke and does not drink. Patient has ACP documents showing that she is DNR and family confirmed that at bedside. Review of Systems: unable to review all systems due to the inability of the patient to answer questions. Past Medical History:  Diagnosis Date   CHF (congestive heart  failure) (HCC)    COPD (chronic obstructive pulmonary disease) (HCC)    Coronary artery disease    GAD (generalized anxiety disorder)    GERD (gastroesophageal reflux disease)    Hypercholesteremia    Hypertension    Past Surgical History:  Procedure Laterality Date   ABDOMINAL HYSTERECTOMY     BLADDER SURGERY     RIGHT/LEFT HEART CATH AND CORONARY ANGIOGRAPHY N/A 05/01/2021   Procedure: RIGHT/LEFT HEART CATH AND CORONARY ANGIOGRAPHY;  Surgeon: [place], Peter M, MD;  Location: [facility] INVASIVE CV LAB;  Service: Cardiovascular;  Laterality: N/A;   Social History:  reports that she quit smoking about 23 months ago. Her smoking use included cigarettes. She started smoking about 56 years ago. She has a 27.5 pack-year smoking history. She has never used smokeless tobacco. She reports that she does not drink alcohol and does not use drugs.  Allergies  Allergen Reactions   Hydrochlorothiazide Other (See Comments)    hyponatremia   Lisinopril Cough   Norpramin [Desipramine] Other (See Comments)    Jittery   Other Other (See Comments)    Estratest  depression & palpitation   Talwin [Pentazocine] Other (See Comments)    hallucinations   Verapamil Cough    Family History  Problem Relation Age of Onset   Stroke Mother     Prior to Admission  medications   Medication Sig Start Date End Date Taking? Authorizing Provider  albuterol (VENTOLIN HFA) 108 (90 Base) MCG/ACT inhaler Inhale 2 puffs into the lungs every 4 (four) hours as needed for shortness of breath or wheezing. 10/03/22  Yes Emokpae, Courage, MD  ALPRAZolam (XANAX) 0.5 MG tablet TAKE ONE TABLET BY MOUTH 3 TIMES DAILY AS NEEDED FOR ANXIETY. Patient taking differently: Take 0.5 mg by mouth 3 (three) times daily as needed for anxiety. 07/15/22  Yes Cottle, Steva Ready., MD  Budeson-Glycopyrrol-Formoterol (BREZTRI AEROSPHERE) 160-9-4.8 MCG/ACT AERO Inhale 2 puffs into the lungs in the morning and at bedtime. 11/07/22  Yes Luciano Cutter, MD   cetirizine (ZYRTEC) 10 MG tablet Take 10 mg by mouth daily.   Yes [provider]  cholestyramine Lanetta Inch) 4 GM/DOSE powder Take 4 g by mouth See admin instructions. 13 grams added to 2-6 oz of water as needed   Yes [provider]  dapagliflozin propanediol (FARXIGA) 10 MG TABS tablet Take 1 tablet (10 mg total) by mouth daily before breakfast. 05/27/22  Yes O'Neal, Ronnald Ramp, MD  metoprolol succinate (TOPROL-XL) 50 MG 24 hr tablet Take 1 tablet (50 mg total) by mouth daily for heart and blood pressure. Take with or immediately following a meal. 11/26/21  Yes O'Neal, Ronnald Ramp, MD  promethazine (PHENERGAN) 12.5 MG tablet Take 12.5 mg by mouth every 6 (six) hours as needed for nausea.   Yes [provider]  risperiDONE (RISPERDAL) 1 MG tablet TAKE 1 TABLET BY MOUTH AT BEDTIME FOR MOOD. 01/14/23  Yes Cottle, Steva Ready., MD  rizatriptan (MAXALT) 5 MG tablet Take 5 mg by mouth as needed for migraine. 09/24/22  Yes [provider]  rosuvastatin (CRESTOR) 20 MG tablet Take 20 mg by mouth daily.   Yes [provider]  sacubitril-valsartan (ENTRESTO) 24-26 MG Take 1 tablet by mouth 2 (two) times daily. 10/24/22  Yes O'Neal, Ronnald Ramp, MD  spironolactone (ALDACTONE) 25 MG tablet TAKE 1/2 TABLET BY MOUTH DAILY FOR FLUID. Patient taking differently: Take 12.5 mg by mouth daily. 04/18/22  Yes O'Neal, Ronnald Ramp, MD  traZODone (DESYREL) 100 MG tablet TAKE 1 TO2 TABLETS AT BEDTIME AS NEEDED FOR SLEEP. USE THE LOWEST DOSE THAT WORKS. Patient taking differently: Take 200 mg by mouth at bedtime. USE THE LOWEST DOSE THAT WORKS. 07/15/22  Yes Cottle, Steva Ready., MD  venlafaxine XR (EFFEXOR-XR) 75 MG 24 hr capsule 1 capsule in the morning and 2 capsules in the evening Patient taking differently: Take 75-150 mg by mouth in the morning and at bedtime. 1 capsule in the morning and 2 capsules in the evening 07/15/22  Yes Cottle, Steva Ready., MD    Physical  Exam: Vitals:   01/23/23 2030 01/23/23 2145 01/23/23 2215 01/23/23 2245  BP: 138/65 (!) 142/59 (!) 109/93 (!) 142/61  Pulse:    87  Resp: (!) 21 (!) 22 17 (!) 21  Temp:      TempSrc:      SpO2:    97%   1.  General: Patient lying supine in bed,  no acute distress   2. Psychiatric: Alert and oriented x 3, mood and behavior normal for situation, pleasant and cooperative with exam   3. Neurologic: Unable to lift left arm off of the bed but is able to weakly grip with her left hand, left facial droop, weakness in the left lower extremity compared to the right, sensation intact   4. HEENMT:  Head is  atraumatic, normocephalic, pupils reactive to light, neck is supple, trachea is midline, mucous membranes are moist   5. Respiratory : Lungs are clear to auscultation bilaterally without wheezing, rhonchi, rales, no cyanosis, no increase in work of breathing or accessory muscle use   6. Cardiovascular : Heart rate normal, rhythm is regular, no murmurs, rubs or gallops, no peripheral edema, peripheral pulses palpated   7. Gastrointestinal:  Abdomen is soft, nondistended, nontender to palpation bowel sounds active, no masses or organomegaly palpated   8. Skin:  Skin is warm, dry and intact without rashes, acute lesions, or ulcers on limited exam   9.Musculoskeletal:  No acute deformities or trauma, no asymmetry in tone, no peripheral edema, peripheral pulses palpated, no tenderness to palpation in the extremities  Data Reviewed: In the ED Vitals are unremarkable, no leukocytosis, chemistry shows a bicarb of 16 CT code stroke shows possible right anterior temporal acute infarct CTA shows complete occlusion of the right ICA distal to the bifurcation with reconstitution in the cavernous segment.  Occlusion of the right M1 perfusion study shows core infarct in both right MCA and PCA territory Neurology was consulted and determined this patient is not a TNK or thrombectomy candidate Admit  for CVA workup  Assessment and Plan: * CVA (cerebral vascular accident) [facility]) - Patient is a vasculopath with perfusion study showing infarct core in the right MCA and PCA territory - Neuro was consulted and determined the patient was a poor candidate for TNK and for thrombectomy due to poor functional status - Patient is DNR - Admit for CVA workup - Family advised that patient will likely need to go to rehab - Continue to monitor  Chronic systolic CHF (congestive heart failure) (HCC) - Continue statin, holding Entresto and spironolactone in the setting of permissive hypertension  GAD (generalized anxiety disorder) - Continue Xanax  Hypertension - Holding spironolactone, metoprolol Entresto for permissive hypertension  COPD (chronic obstructive pulmonary disease) (HCC) - Continue albuterol and Breztri  Hypercholesteremia - Continue statin      Advance Care Planning:   Code Status: Limited: Do not attempt resuscitation (DNR) -DNR-LIMITED -Do Not Intubate/DNI   Consults: Neuro  Family Communication: Husband and daughter at bedside  Severity of Illness: The appropriate patient status for this patient is OBSERVATION. Observation status is judged to be reasonable and necessary in order to provide the required intensity of service to ensure the patient's safety. The patient's presenting symptoms, physical exam findings, and initial radiographic and laboratory data in the context of their medical condition is felt to place them at decreased risk for further clinical deterioration. Furthermore, it is anticipated that the patient will be medically stable for discharge from the hospital within 2 midnights of admission.   Author: Lilyan Gilford, DO 01/23/2023 11:34 PM  For on call review [email or web address].

## 2023-01-23 NOTE — Assessment & Plan Note (Signed)
-   Patient is a vasculopath with perfusion study showing infarct core in the right MCA and PCA territory - Neuro was consulted and determined the patient was a poor candidate for TNK and for thrombectomy due to poor functional status - Patient is DNR - Admit for CVA workup - Family advised that patient will likely need to go to rehab - Continue to monitor

## 2023-01-24 ENCOUNTER — Observation Stay (HOSPITAL_BASED_OUTPATIENT_CLINIC_OR_DEPARTMENT_OTHER): Payer: PPO

## 2023-01-24 ENCOUNTER — Other Ambulatory Visit (HOSPITAL_COMMUNITY): Payer: Self-pay | Admitting: *Deleted

## 2023-01-24 DIAGNOSIS — R414 Neurologic neglect syndrome: Secondary | ICD-10-CM | POA: Diagnosis present

## 2023-01-24 DIAGNOSIS — R1310 Dysphagia, unspecified: Secondary | ICD-10-CM | POA: Diagnosis present

## 2023-01-24 DIAGNOSIS — Z888 Allergy status to other drugs, medicaments and biological substances status: Secondary | ICD-10-CM | POA: Diagnosis not present

## 2023-01-24 DIAGNOSIS — Z7951 Long term (current) use of inhaled steroids: Secondary | ICD-10-CM | POA: Diagnosis not present

## 2023-01-24 DIAGNOSIS — I639 Cerebral infarction, unspecified: Secondary | ICD-10-CM | POA: Diagnosis present

## 2023-01-24 DIAGNOSIS — Z7902 Long term (current) use of antithrombotics/antiplatelets: Secondary | ICD-10-CM | POA: Diagnosis not present

## 2023-01-24 DIAGNOSIS — Z66 Do not resuscitate: Secondary | ICD-10-CM | POA: Diagnosis present

## 2023-01-24 DIAGNOSIS — I10 Essential (primary) hypertension: Secondary | ICD-10-CM | POA: Diagnosis not present

## 2023-01-24 DIAGNOSIS — I110 Hypertensive heart disease with heart failure: Secondary | ICD-10-CM | POA: Diagnosis present

## 2023-01-24 DIAGNOSIS — I6389 Other cerebral infarction: Secondary | ICD-10-CM

## 2023-01-24 DIAGNOSIS — I6350 Cerebral infarction due to unspecified occlusion or stenosis of unspecified cerebral artery: Secondary | ICD-10-CM

## 2023-01-24 DIAGNOSIS — Z87891 Personal history of nicotine dependence: Secondary | ICD-10-CM | POA: Diagnosis not present

## 2023-01-24 DIAGNOSIS — J9819 Other pulmonary collapse: Secondary | ICD-10-CM | POA: Diagnosis present

## 2023-01-24 DIAGNOSIS — R471 Dysarthria and anarthria: Secondary | ICD-10-CM | POA: Diagnosis present

## 2023-01-24 DIAGNOSIS — Z7982 Long term (current) use of aspirin: Secondary | ICD-10-CM | POA: Diagnosis not present

## 2023-01-24 DIAGNOSIS — I2510 Atherosclerotic heart disease of native coronary artery without angina pectoris: Secondary | ICD-10-CM | POA: Diagnosis present

## 2023-01-24 DIAGNOSIS — Z79899 Other long term (current) drug therapy: Secondary | ICD-10-CM | POA: Diagnosis not present

## 2023-01-24 DIAGNOSIS — R29710 NIHSS score 10: Secondary | ICD-10-CM | POA: Diagnosis present

## 2023-01-24 DIAGNOSIS — E7800 Pure hypercholesterolemia, unspecified: Secondary | ICD-10-CM | POA: Diagnosis present

## 2023-01-24 DIAGNOSIS — J449 Chronic obstructive pulmonary disease, unspecified: Secondary | ICD-10-CM | POA: Diagnosis present

## 2023-01-24 DIAGNOSIS — R509 Fever, unspecified: Secondary | ICD-10-CM | POA: Diagnosis present

## 2023-01-24 DIAGNOSIS — K219 Gastro-esophageal reflux disease without esophagitis: Secondary | ICD-10-CM | POA: Diagnosis present

## 2023-01-24 DIAGNOSIS — F411 Generalized anxiety disorder: Secondary | ICD-10-CM | POA: Diagnosis present

## 2023-01-24 DIAGNOSIS — G8194 Hemiplegia, unspecified affecting left nondominant side: Secondary | ICD-10-CM | POA: Diagnosis present

## 2023-01-24 DIAGNOSIS — R29810 Facial weakness: Secondary | ICD-10-CM | POA: Diagnosis present

## 2023-01-24 DIAGNOSIS — J410 Simple chronic bronchitis: Secondary | ICD-10-CM | POA: Diagnosis not present

## 2023-01-24 DIAGNOSIS — I5022 Chronic systolic (congestive) heart failure: Secondary | ICD-10-CM | POA: Diagnosis present

## 2023-01-24 DIAGNOSIS — I6521 Occlusion and stenosis of right carotid artery: Secondary | ICD-10-CM | POA: Diagnosis present

## 2023-01-24 DIAGNOSIS — I63511 Cerebral infarction due to unspecified occlusion or stenosis of right middle cerebral artery: Secondary | ICD-10-CM | POA: Diagnosis present

## 2023-01-24 DIAGNOSIS — J439 Emphysema, unspecified: Secondary | ICD-10-CM

## 2023-01-24 LAB — LIPID PANEL
Cholesterol: 86 mg/dL (ref 0–200)
HDL: 49 mg/dL (ref >40)
LDL Cholesterol: 16 mg/dL (ref 0–99)
Total CHOL/HDL Ratio: 1.8 {ratio}
Triglycerides: 104 mg/dL (ref <150)
VLDL: 21 mg/dL (ref 0–40)

## 2023-01-24 LAB — HEMOGLOBIN A1C
Hgb A1c MFr Bld: 6.4 % — ABNORMAL HIGH (ref 4.8–5.6)
Mean Plasma Glucose: 136.98 mg/dL

## 2023-01-24 LAB — ECHOCARDIOGRAM COMPLETE
Area-P 1/2: 2.91 cm2
S' Lateral: 2.4 cm

## 2023-01-24 LAB — GLUCOSE, CAPILLARY: Glucose-Capillary: 123 mg/dL — ABNORMAL HIGH (ref 70–99)

## 2023-01-24 MED ORDER — PERFLUTREN LIPID MICROSPHERE
1.0000 mL | INTRAVENOUS | Status: AC | PRN
  Administered 2023-01-24: 3 mL via INTRAVENOUS

## 2023-01-24 MED ORDER — ASPIRIN 300 MG RE SUPP
300.0000 mg | Freq: Once | RECTAL | Status: AC
  Administered 2023-01-24: 300 mg via RECTAL
  Filled 2023-01-24: qty 1

## 2023-01-24 MED ORDER — FENTANYL CITRATE PF 50 MCG/ML IJ SOSY
25.0000 ug | PREFILLED_SYRINGE | INTRAMUSCULAR | Status: DC | PRN
  Administered 2023-01-24 – 2023-01-26 (×2): 25 ug via INTRAVENOUS
  Filled 2023-01-24 (×3): qty 1

## 2023-01-24 NOTE — Evaluation (Signed)
Occupational Therapy Evaluation Patient Details Name: Melinda Gomez MRN: [id number] DOB: June 13, 1945 Today's Date: 01/24/2023   History of Present Illness Melinda Gomez is a 77 y.o. female with medical history significant of CHF, COPD, CAD, GAD, GERD, hyperlipidemia, hypertension, presents to the ED with a chief complaint of difficulty swallowing.  Husband is patient's primary caretaker.  He reports he last saw her mid afternoon when he went out to work in the yard.  When he came back and she was complaining of a headache and asked for her rizatriptan.  It was the first time she had asked for it in 3 days.  He noted something was wrong when she could not pick it up with her hand.  Her left hand was not moving at all and her right hand could not get coordinated enough to pick it up.  He put in her mouth give her some Pepsi and she had a lot of difficulty swallowing it.  He does think she got it down.  After that he noted that her speech was not clear.  She had a left-sided droop as well.  He reports that prior to that she had been doing very well.  She even ambulated on her own to the bathroom and mostly took care of that herself except for redressing after using the toilet.  She has not been complaining of anything to him.  He called EMS who advised him to evaluate her pronator drift.  She could not lift her left arm.  They brought her in for stroke evaluation.  Unfortunately, patient is not able to provide much history as she is quite somnolent after her headache cocktail. (per DO)   Clinical Impression   Pt lethargic for OT and PT co-evaluation. Pt had been medicated prior to the session. Pt was able to ambulate with rollator within the home at baseline. Pt was assisted for bathing and dressing at baseline. Pt required max A for sit to stand but did participate with R UE and R LE bearing weight. Pt L UE is flaccid and shows signs of L visual field neglect. Poor sitting balance at EOB. Mod to total  assist for ADL's at this time due to poor balance and lack of L UE functional use. Pt family present with husband reporting ability to be home with the pt most of the time. Pt was left in bed with family present. Pt will benefit from continued OT in the hospital and recommended venue below to increase strength, balance, and endurance for safe ADL's.         If plan is discharge home, recommend the following: Two people to help with walking and/or transfers;A lot of help with bathing/dressing/bathroom;Assistance with cooking/housework;Assistance with feeding;Direct supervision/assist for medications management;Assist for transportation;Help with stairs or ramp for entrance    Functional Status Assessment  Patient has had a recent decline in their functional status and demonstrates the ability to make significant improvements in function in a reasonable and predictable amount of time.  Equipment Recommendations  None recommended by OT           Precautions / Restrictions Precautions Precautions: Fall Restrictions Weight Bearing Restrictions: No      Mobility Bed Mobility Overal bed mobility: Needs Assistance Bed Mobility: Supine to Sit, Sit to Supine     Supine to sit: Max assist Sit to supine: Max assist   General bed mobility comments: Much assist; little active assist for mobility from pt.    Transfers Overall transfer  level: Needs assistance Equipment used: Rolling walker (2 wheels) Transfers: Sit to/from Stand, Bed to chair/wheelchair/BSC Sit to Stand: Max assist           General transfer comment: Mod to max A to stand from EOB. Able to use R LE to bear weight. Difficulty with bearing any weight through L LE or grasping RW with L UE.      Balance Overall balance assessment: Needs assistance Sitting-balance support: Single extremity supported, Feet supported Sitting balance-Leahy Scale: Poor Sitting balance - Comments: seated at EOB Postural control: Posterior  lean, Left lateral lean Standing balance support: Single extremity supported, During functional activity, Reliant on assistive device for balance Standing balance-Leahy Scale: Poor Standing balance comment: using RW                           ADL either performed or assessed with clinical judgement   ADL Overall ADL's : Needs assistance/impaired Eating/Feeding: Moderate assistance;Bed level   Grooming: Moderate assistance;Maximal assistance;Bed level   Upper Body Bathing: Moderate assistance;Maximal assistance;Bed level   Lower Body Bathing: Maximal assistance;Total assistance;Bed level   Upper Body Dressing : Moderate assistance;Maximal assistance;Bed level   Lower Body Dressing: Maximal assistance;Total assistance;Bed level   Toilet Transfer: Maximal assistance;Stand-pivot;Rolling walker (2 wheels) Toilet Transfer Details (indicate cue type and reason): Simulated via brief sit to stand and attempted steps laterally. Toileting- Clothing Manipulation and Hygiene: Maximal assistance;Total assistance;Bed level   Tub/ Shower Transfer: Maximal assistance;Stand-pivot   Functional mobility during ADLs: Maximal assistance;Total assistance;+2 for physical assistance       Vision Ability to See in Adequate Light: 1 Impaired Patient Visual Report: No change from baseline Vision Assessment?: Yes Alignment/Gaze Preference: Gaze right Tracking/Visual Pursuits: Other (comment) (No tracking to L of midline. Not tracking in upper or lower quadrants on R side, only midline.) Visual Fields: Left visual field deficit     Perception Perception: Not tested       Praxis Praxis: Impaired       Pertinent Vitals/Pain Pain Assessment Pain Assessment: Faces Faces Pain Scale: No hurt     Extremity/Trunk Assessment Upper Extremity Assessment Upper Extremity Assessment: Right hand dominant;RUE deficits/detail;LUE deficits/detail RUE Deficits / Details: 3-/5 for shoulder flexion;  Generally weak otherwise. RUE Sensation: WNL LUE Deficits / Details: Flaccid; no active movement. WFL P/ROM. No tone. LUE Sensation: WNL LUE Coordination: decreased fine motor;decreased gross motor   Lower Extremity Assessment Lower Extremity Assessment: Defer to PT evaluation   Cervical / Trunk Assessment Cervical / Trunk Assessment: Normal   Communication Communication Communication: Difficulty communicating thoughts/reduced clarity of speech;Difficulty following commands/understanding Following commands: Follows one step commands inconsistently Cueing Techniques: Tactile cues;Verbal cues   Cognition Arousal: Lethargic Behavior During Therapy: Flat affect Overall Cognitive Status: Impaired/Different from baseline Area of Impairment: Attention                   Current Attention Level: Focused, Sustained           General Comments: Pt very lethargic but able to follow commands at times with verbal and tactile cues. Eyes closed much of the session but able to open them when sitting upright.                      Home Living Family/patient expects to be discharged to:: Private residence Living Arrangements: Spouse/significant other Available Help at Discharge: Family;Available 24 hours/day (for the most part) Type of Home: House Home Access:  Stairs to enter [employer] of Steps: 1 Entrance Stairs-Rails: Right;Left (can't reach both) Home Layout: Laundry or work area in basement;Able to live on main level with bedroom/bathroom     Bathroom Shower/Tub: Producer, television/film/video: Handicapped height Bathroom Accessibility: Yes How Accessible: Accessible via wheelchair;Accessible via walker Home Equipment: Rollator (4 wheels);Rolling Walker (2 wheels);BSC/3in1;Grab bars - tub/shower;Grab bars - toilet;Wheelchair - manual;Tub bench          Prior Functioning/Environment Prior Level of Function : Needs assist       Physical Assist :  ADLs (physical);Mobility (physical) Mobility (physical): Gait ADLs (physical): Dressing;Bathing;IADLs Mobility Comments: [place]ous[place] ambulator with rollator. W/c used in the community. ADLs Comments: Assist for bathing, dressing, and toilet transfer. Pt able to compelte peri-care, feed, and groom.        OT Problem List: Decreased strength;Decreased range of motion;Decreased activity tolerance;Impaired balance (sitting and/or standing);Impaired vision/perception;Decreased coordination;Decreased cognition;Decreased safety awareness;Impaired tone;Impaired UE functional use      OT Treatment/Interventions: Self-care/ADL training;Therapeutic exercise;Neuromuscular education;DME and/or AE instruction;Therapeutic activities;Visual/perceptual remediation/compensation;Patient/family education;Balance training    OT Goals(Current goals can be found in the care plan section) Acute Rehab OT Goals Patient Stated Goal: Improve function OT Goal Formulation: With family Time For Goal Achievement: 02/07/23 Potential to Achieve Goals: Fair  OT Frequency: Min 2X/week    Co-evaluation PT/OT/SLP Co-Evaluation/Treatment: Yes Reason for Co-Treatment: Complexity of the patient's impairments (multi-system involvement)   OT goals addressed during session: ADL's and self-care      AM-PAC OT "6 Clicks" Daily Activity     Outcome Measure Help from another person eating meals?: A Lot Help from another person taking care of personal grooming?: A Lot Help from another person toileting, which includes using toliet, bedpan, or urinal?: Total Help from another person bathing (including washing, rinsing, drying)?: A Lot Help from another person to put on and taking off regular upper body clothing?: A Lot Help from another person to put on and taking off regular lower body clothing?: A Lot 6 Click Score: 11   End of Session Equipment Utilized During Treatment: Rolling walker (2 wheels)  Activity Tolerance: Patient  limited by lethargy Patient left: in bed;with call bell/phone within reach;with family/visitor present  OT Visit Diagnosis: Unsteadiness on feet (R26.81);Other abnormalities of gait and mobility (R26.89);Muscle weakness (generalized) (M62.81);Other symptoms and signs involving the nervous system (R29.898);Other symptoms and signs involving cognitive function;Hemiplegia and hemiparesis Hemiplegia - Right/Left: Left Hemiplegia - dominant/non-dominant: Non-Dominant Hemiplegia - caused by: Cerebral infarction                Time: 1308-6578 OT Time Calculation (min): 20 min Charges:  OT General Charges $OT Visit: 1 Visit OT Evaluation $OT Eval Moderate Complexity: 1 Mod  Nkosi Cortright OT, MOT   [employer] 01/24/2023, 10:20 AM

## 2023-01-24 NOTE — Progress Notes (Signed)
Very drowsy due to fentanyl given in ED at 0845.  Can state name but not month and unable to perform tasks necessary for stroke assessment .  Will respond weakly to squeezing hand but unable to perform other tasks at this time.   Family at bedside.

## 2023-01-24 NOTE — ED Provider Notes (Signed)
[place] EMERGENCY DEPARTMENT AT [facility] Provider Note  CSN: [id number] Arrival date & time: 01/23/23 1729  Chief Complaint(s) Code Stroke  HPI Melinda Gomez is a 77 y.o. female with PMH CHF, COPD, CAD, GERD, HTN, HLD who presents emergency department for evaluation of left-sided weakness.  Last known well noon today.  Patient reportedly took a nap and woke up not acting normally.  Has been noticing the gland on the left side and EMS was called.  Patient arrives as a stroke alert.  Patient arrives with obvious left-sided deficits and left-sided neglect.  Denies chest pain, shortness of breath, abdominal pain, nausea, vomiting or other systemic symptoms.   Past Medical History Past Medical History:  Diagnosis Date   CHF (congestive heart failure) (HCC)    COPD (chronic obstructive pulmonary disease) (HCC)    Coronary artery disease    GAD (generalized anxiety disorder)    GERD (gastroesophageal reflux disease)    Hypercholesteremia    Hypertension    Patient Active Problem List   Diagnosis Date Noted   CVA (cerebral vascular accident) (HCC) 01/23/2023   Bronchiectasis without complication (HCC) 11/07/2022   Acute hypoxic respiratory failure (HCC) 10/02/2022   CAP (community acquired pneumonia) 10/02/2022   Sacral decubitus ulcer 10/02/2022   Chronic systolic CHF (congestive heart failure) (HCC) 05/01/2021   Hypokalemia 02/23/2021   Hypertension    Hypercholesteremia    COPD (chronic obstructive pulmonary disease) (HCC)    GAD (generalized anxiety disorder)    Chest pain 02/22/2021   PTSD (post-traumatic stress disorder) 01/02/2018   GERD (gastroesophageal reflux disease) 01/02/2018   Chronic insomnia 01/02/2018   Home Medication(s) Prior to Admission medications   Medication Sig Start Date End Date Taking? Authorizing Provider  albuterol (VENTOLIN HFA) 108 (90 Base) MCG/ACT inhaler Inhale 2 puffs into the lungs every 4 (four) hours as needed for shortness  of breath or wheezing. 10/03/22  Yes Emokpae, Courage, MD  ALPRAZolam (XANAX) 0.5 MG tablet TAKE ONE TABLET BY MOUTH 3 TIMES DAILY AS NEEDED FOR ANXIETY. Patient taking differently: Take 0.5 mg by mouth 3 (three) times daily as needed for anxiety. 07/15/22  Yes Cottle, Steva Ready., MD  Budeson-Glycopyrrol-Formoterol (BREZTRI AEROSPHERE) 160-9-4.8 MCG/ACT AERO Inhale 2 puffs into the lungs in the morning and at bedtime. 11/07/22  Yes Luciano Cutter, MD  cetirizine (ZYRTEC) 10 MG tablet Take 10 mg by mouth daily.   Yes [provider]  cholestyramine Lanetta Inch) 4 GM/DOSE powder Take 4 g by mouth See admin instructions. 13 grams added to 2-6 oz of water as needed   Yes [provider]  dapagliflozin propanediol (FARXIGA) 10 MG TABS tablet Take 1 tablet (10 mg total) by mouth daily before breakfast. 05/27/22  Yes O'Neal, Ronnald Ramp, MD  metoprolol succinate (TOPROL-XL) 50 MG 24 hr tablet Take 1 tablet (50 mg total) by mouth daily for heart and blood pressure. Take with or immediately following a meal. 11/26/21  Yes O'Neal, Ronnald Ramp, MD  promethazine (PHENERGAN) 12.5 MG tablet Take 12.5 mg by mouth every 6 (six) hours as needed for nausea.   Yes [provider]  risperiDONE (RISPERDAL) 1 MG tablet TAKE 1 TABLET BY MOUTH AT BEDTIME FOR MOOD. 01/14/23  Yes Cottle, Steva Ready., MD  rizatriptan (MAXALT) 5 MG tablet Take 5 mg by mouth as needed for migraine. 09/24/22  Yes [provider]  rosuvastatin (CRESTOR) 20 MG tablet Take 20 mg by mouth daily.   Yes [provider]  sacubitril-valsartan (ENTRESTO) 24-26 MG Take 1 tablet by mouth 2 (two) times daily. 10/24/22  Yes O'Neal, Ronnald Ramp, MD  spironolactone (ALDACTONE) 25 MG tablet TAKE 1/2 TABLET BY MOUTH DAILY FOR FLUID. Patient taking differently: Take 12.5 mg by mouth daily. 04/18/22  Yes O'Neal, Ronnald Ramp, MD  traZODone (DESYREL) 100 MG tablet TAKE 1 TO2 TABLETS AT BEDTIME AS NEEDED FOR SLEEP. USE THE  LOWEST DOSE THAT WORKS. Patient taking differently: Take 200 mg by mouth at bedtime. USE THE LOWEST DOSE THAT WORKS. 07/15/22  Yes Cottle, Steva Ready., MD  venlafaxine XR (EFFEXOR-XR) 75 MG 24 hr capsule 1 capsule in the morning and 2 capsules in the evening Patient taking differently: Take 75-150 mg by mouth in the morning and at bedtime. 1 capsule in the morning and 2 capsules in the evening 07/15/22  Yes Cottle, Steva Ready., MD                                                                                                                                    Past Surgical History Past Surgical History:  Procedure Laterality Date   ABDOMINAL HYSTERECTOMY     BLADDER SURGERY     RIGHT/LEFT HEART CATH AND CORONARY ANGIOGRAPHY N/A 05/01/2021   Procedure: RIGHT/LEFT HEART CATH AND CORONARY ANGIOGRAPHY;  Surgeon: [place], Peter M, MD;  Location: [facility] INVASIVE CV LAB;  Service: Cardiovascular;  Laterality: N/A;   Family History Family History  Problem Relation Age of Onset   Stroke Mother     Social History Social History   Tobacco Use   Smoking status: Former    Current packs/day: 0.00    Average packs/day: 0.5 packs/day for 55.0 years (27.5 ttl pk-yrs)    Types: Cigarettes    Start date: 02/1966    Quit date: 02/2021    Years since quitting: 1.9   Smokeless tobacco: Never  Substance Use Topics   Alcohol use: Never   Drug use: Never   Allergies Hydrochlorothiazide, Lisinopril, Norpramin [desipramine], Other, Talwin [pentazocine], and Verapamil  Review of Systems Review of Systems  Neurological:  Positive for weakness.    Physical Exam Vital Signs  I have reviewed the triage vital signs BP (!) 142/61   Pulse 87   Temp 98.2 F (36.8 C) (Oral)   Resp (!) 21   SpO2 97%   Physical Exam Vitals and nursing note reviewed.  Constitutional:      General: She is not in acute distress.    Appearance: She is well-developed.  HENT:     Head: Normocephalic and atraumatic.  Eyes:      Conjunctiva/sclera: Conjunctivae normal.  Cardiovascular:     Rate and Rhythm: Normal rate and regular rhythm.     Heart sounds: No murmur heard. Pulmonary:     Effort: Pulmonary effort is normal. No respiratory distress.     Breath sounds: Normal breath sounds.  Abdominal:  Palpations: Abdomen is soft.     Tenderness: There is no abdominal tenderness.  Musculoskeletal:        General: No swelling.     Cervical back: Neck supple.  Skin:    General: Skin is warm and dry.     Capillary Refill: Capillary refill takes less than 2 seconds.  Neurological:     Mental Status: She is alert.     Motor: Weakness present.  Psychiatric:        Mood and Affect: Mood normal.     ED Results and Treatments Labs (all labs ordered are listed, but only abnormal results are displayed) Labs Reviewed  COMPREHENSIVE METABOLIC PANEL - Abnormal; Notable for the following components:      Result Value   CO2 16 (*)    Glucose, Bld 142 (*)    Creatinine, Ser 1.21 (*)    GFR, Estimated 46 (*)    All other components within normal limits  CBG MONITORING, ED - Abnormal; Notable for the following components:   Glucose-Capillary 153 (*)    All other components within normal limits  I-STAT CHEM 8, ED - Abnormal; Notable for the following components:   Chloride 112 (*)    Creatinine, Ser 1.20 (*)    Glucose, Bld 135 (*)    Calcium, Ion 1.05 (*)    TCO2 17 (*)    All other components within normal limits  ETHANOL  PROTIME-INR  APTT  CBC  DIFFERENTIAL  RAPID URINE DRUG SCREEN, HOSP PERFORMED  URINALYSIS, ROUTINE W REFLEX MICROSCOPIC  LIPID PANEL  HEMOGLOBIN A1C                                                                                                                          Radiology MR BRAIN WO CONTRAST  Result Date: 01/24/2023 CLINICAL DATA:  Follow-up examination for stroke. EXAM: MRI HEAD WITHOUT CONTRAST TECHNIQUE: Multiplanar, multiecho pulse sequences of the brain and surrounding  structures were obtained without intravenous contrast. COMPARISON:  Prior CTs from earlier the same day. FINDINGS: Brain: Mild age-related cerebral atrophy. Patchy and confluent T2/FLAIR hyperintensity involving the periventricular and deep white matter both cerebral hemispheres, distant with chronic small vessel ischemic disease, moderate to advanced in nature. Mild patchy involvement of the pons noted. Large confluent area of restricted diffusion involving the right temporal occipital region, consistent with an evolving acute right MCA distribution infarct (series 5, image 23). No associated hemorrhage. Early gyral swelling and edema without significant regional mass effect at this time. No other evidence for acute or subacute ischemia elsewhere. No acute or chronic intracranial blood products. No mass lesion, midline shift or mass effect. No hydrocephalus or extra-axial fluid collection. Pituitary gland and suprasellar region within normal limits. Vascular: Loss of normal flow void within the right ICA to the terminus, consistent with previously identified occlusion. Irregular and attenuated flow related signal noted within the right MCA distribution as well. Skull and upper cervical spine: Craniocervical junction within normal  limits. Bone marrow signal intensity normal. No scalp soft tissue abnormality. Sinuses/Orbits: Right gaze preference. Prior bilateral ocular lens replacement. Changes of chronic left sphenoid sinusitis. Paranasal sinuses are otherwise clear. Trace right mastoid effusion, of doubtful significance. Other: None. IMPRESSION: 1. Large evolving acute right MCA distribution infarct involving the right temporoccipital region. No associated hemorrhage or significant regional mass effect at this time. 2. Loss of normal flow void within the right ICA and MCA as above, consistent with previously identified occlusion. Findings better characterized on prior CTA. 3. Underlying age-related cerebral  atrophy with moderate to advanced chronic microvascular ischemic disease. Electronically Signed   By: Rise Mu M.D.   On: 01/24/2023 00:27   CT ANGIO HEAD NECK W WO CM W PERF (CODE STROKE)  Result Date: 01/23/2023 CLINICAL DATA:  Left-sided weakness, left-sided facial droop EXAM: CT ANGIOGRAPHY HEAD AND NECK WITH AND WITHOUT CONTRAST CT PERFUSION TECHNIQUE: Multidetector CT imaging of the head and neck was performed using the standard protocol during bolus administration of intravenous contrast. Multiplanar CT image reconstructions and MIPs were obtained to evaluate the vascular anatomy. Carotid stenosis measurements (when applicable) are obtained utilizing NASCET criteria, using the distal internal carotid diameter as the denominator. Multiphase CT imaging of the brain was performed following IV bolus contrast injection. Subsequent parametric perfusion maps were calculated using RAPID software. RADIATION DOSE REDUCTION: This exam was performed according to the departmental dose-optimization program which includes automated exposure control, adjustment of the mA and/or kV according to patient size and/or use of iterative reconstruction technique. COMPARISON:  CT head 01/23/2023 FINDINGS: CT HEAD FINDINGS For noncontrast findings, please see same day CT head. CTA NECK FINDINGS Aortic arch: Choose 1 aortic atherosclerosis. Right carotid system: Complete occlusion of the right ICA just distal to the bifurcation (series 6, image 229), without evidence of reconstitution until the cavernous segment. The common carotid and external carotid are patent. Left carotid system: 50% stenosis in the proximal left ICA. No evidence of dissection. Vertebral arteries: No evidence of dissection, occlusion, or hemodynamically significant stenosis (greater than 50%). Skeleton: No acute osseous abnormality. Degenerative changes in the cervical spine. Other neck: No acute finding. Upper chest: Multiple small pulmonary  nodules in the right upper lobe, the largest of which measures up to 5 mm. No pleural effusion. Review of the MIP images confirms the above findings CTA HEAD FINDINGS Anterior circulation: Reconstitution of the right ICA in the cavernous segment, likely retrograde, with overall poor opacification and possible additional occlusion near the terminus (appear 6, image 106). The left ICA is patent to the terminus without significant stenosis. Occlusion of the right M1 (series 6, image 104), with reconstitution just before the bifurcation, likely via collaterals. The right MCA branches appear perfused, although the degree of opacification is diminished compared to the left MCA. No focal stenosis in the right M 2 and more distal MCA branches. The left MCA is patent to its distal aspects without significant stenosis. A1 segments patent. Normal anterior communicating artery. Anterior cerebral arteries are patent to their distal aspects without significant stenosis. No M1 stenosis or occlusion. MCA branches perfused to their distal aspects without significant stenosis. Posterior circulation: Vertebral arteries patent to the vertebrobasilar junction without significant stenosis. Posterior inferior cerebellar arteries patent proximally. Basilar patent to its distal aspect without significant stenosis. Superior cerebellar arteries patent proximally. Patent left P1. Likely hypoplastic right P1. Near fetal origin of the right PCA from the right posterior communicating artery, which appears thrombosed near the ICA terminus (series 6,  image 111-112). The right PCA and its branches are diminished in opacification compared to the left PCA. The left PCA is patent from its origin through its distal branches, without significant stenosis. The left posterior communicating artery is not definitively visualized. Venous sinuses: As permitted by contrast timing, patent. Anatomic variants: Near fetal origin of the right PCA. No evidence of  aneurysm or vascular malformation. Review of the MIP images confirms the above findings CT Brain Perfusion Findings: ASPECTS: 9 CBF (<30%) Volume: 58mL Perfusion (Tmax>6.0s) volume: Mismatch Volume: Infarction Location:Infarct is primarily in the right temporal lobe and inferior frontal lobe, in the right MCA and, to a lesser extent, some of the PCA territory, with the penumbra involving the remainder of the right MCA territory and some of the right PCA territory. IMPRESSION: 1. Complete occlusion of the right ICA just distal to the bifurcation, with reconstitution in the cavernous segment, likely retrograde, with overall poor opacification and possible additional occlusion near the terminus. 2. Occlusion of the right M1, with reconstitution just before the bifurcation, likely via collaterals. The right MCA branches appear perfused, although the degree of opacification is diminished compared to the left MCA. 3. Near fetal origin of the right PCA from the right posterior communicating artery, which appears occluded near the ICA terminus. The right PCA and its branches are diminished in opacification compared to the left PCA. 4. On perfusion imaging, the infarct core (58 mL) is in both the right MCA and PCA territory. 159 mL penumbra, with 101 mL mismatch 5. 50% stenosis in the proximal left ICA. 6. Multiple small pulmonary nodules in the right upper lobe, the largest of which measures up to 5 mm. No follow-up needed if patient is low-risk (and has no known or suspected primary neoplasm). Non-contrast chest CT can be considered in 12 months if patient is high-risk. This recommendation follows the consensus statement: Guidelines for Management of Incidental Pulmonary Nodules Detected on CT Images: From the Fleischner Society 2017; Radiology 2017; 284:228-243. 7. Aortic atherosclerosis. Aortic Atherosclerosis (ICD10-I70.0). Code stroke imaging results from the CTA were communicated on 01/23/2023 at 6:03 pm  to provider [facility] and at 6:15 p.m. to Dr. Wilford Corner via telephone, who verbally acknowledged these results. Electronically Signed   By: Wiliam Ke M.D.   On: 01/23/2023 18:19   CT HEAD CODE STROKE WO CONTRAST  Result Date: 01/23/2023 CLINICAL DATA:  Code stroke.  Stroke suspected EXAM: CT HEAD WITHOUT CONTRAST TECHNIQUE: Contiguous axial images were obtained from the base of the skull through the vertex without intravenous contrast. RADIATION DOSE REDUCTION: This exam was performed according to the departmental dose-optimization program which includes automated exposure control, adjustment of the mA and/or kV according to patient size and/or use of iterative reconstruction technique. COMPARISON:  None Available. FINDINGS: Brain: Possible hypodensity and loss of gray-white differentiation in the right anterior temporal lobe, including the anterior insula. No evidence of acute hemorrhage, mass, mass effect, or midline shift. No hydrocephalus or extra-axial collection. Periventricular white matter changes, likely the sequela of chronic small vessel ischemic disease. Vascular: Possible hyperdense right MCA. Skull: Negative for fracture or focal lesion. Sinuses/Orbits: Chronic left sphenoid sinusitis, with complete opacification and osseous thickening. Otherwise clear paranasal sinuses. Status post bilateral lens replacements. Other: The mastoid air cells are well aerated. ASPECTS [facility] Stroke Program Early CT Score) - Ganglionic level infarction (caudate, lentiform nuclei, internal capsule, insula, M1-M3 cortex): 6 - Supraganglionic infarction (M4-M6 cortex): 3 Total score (0-10 with 10 being normal): 10 IMPRESSION: 1.  Possible hypodensity and loss of gray-white differentiation in the right anterior temporal lobe, including the insula, which could represent an acute infarct. Possible hyperdense right MCA. No hemorrhage. 2. ASPECTS is 10. Code stroke imaging results were communicated on 01/23/2023 at 5:51 pm to  provider [facility] via telephone, who verbally acknowledged these results. Electronically Signed   By: Wiliam Ke M.D.   On: 01/23/2023 17:53    Pertinent labs & imaging results that were available during my care of the patient were reviewed by me and considered in my medical decision making (see MDM for details).  Medications Ordered in ED Medications   stroke: early stages of recovery book (has no administration in time range)  0.9 %  sodium chloride infusion (0 mLs Intravenous Hold 01/23/23 2156)  acetaminophen (TYLENOL) tablet 650 mg (has no administration in time range)    Or  acetaminophen (TYLENOL) 160 MG/5ML solution 650 mg (has no administration in time range)    Or  acetaminophen (TYLENOL) suppository 650 mg (has no administration in time range)  senna-docusate (Senokot-S) tablet 1 tablet (has no administration in time range)  heparin injection 5,000 Units (5,000 Units Subcutaneous Not Given 01/23/23 2255)  aspirin EC tablet 81 mg (81 mg Oral Not Given 01/23/23 2157)  dextrose 5 % and 0.45 % NaCl infusion ( Intravenous New Bag/Given 01/23/23 1957)  cholestyramine (QUESTRAN) packet 4 g (has no administration in time range)  rosuvastatin (CRESTOR) tablet 20 mg (20 mg Oral Not Given 01/23/23 2157)  ALPRAZolam Prudy Feeler) tablet 0.5 mg (has no administration in time range)  risperiDONE (RISPERDAL) tablet 1 mg (1 mg Oral Not Given 01/23/23 2158)  venlafaxine XR (EFFEXOR-XR) 24 hr capsule 75 mg (has no administration in time range)  albuterol (VENTOLIN HFA) 108 (90 Base) MCG/ACT inhaler 2 puff (has no administration in time range)  fluticasone furoate-vilanterol (BREO ELLIPTA) 100-25 MCG/ACT 1 puff (has no administration in time range)    And  umeclidinium bromide (INCRUSE ELLIPTA) 62.5 MCG/ACT 1 puff (has no administration in time range)  iohexol (OMNIPAQUE) 350 MG/ML injection 80 mL (80 mLs Intravenous Contrast Given 01/23/23 1806)  prochlorperazine (COMPAZINE) injection 10 mg (10 mg  Intravenous Given 01/23/23 2001)  diphenhydrAMINE (BENADRYL) injection 25 mg (25 mg Intravenous Given 01/23/23 2000)                                                                                                                                     Procedures .Critical Care  Performed by: Glendora Score, MD Authorized by: Glendora Score, MD   Critical care provider statement:    Critical care time (minutes):  30   Critical care was necessary to treat or prevent imminent or life-threatening deterioration of the following conditions:  CNS failure or compromise (Consideration of TNK and thrombectomy)   Critical care was time spent personally by me on the following activities:  Development of treatment plan with patient or surrogate, discussions  with consultants, evaluation of patient's response to treatment, examination of patient, ordering and review of laboratory studies, ordering and review of radiographic studies, ordering and performing treatments and interventions, pulse oximetry, re-evaluation of patient's condition and review of old charts   (including critical care time)  Medical Decision Making / ED Course   This patient presents to the ED for concern of weakness, this involves an extensive number of treatment options, and is a complaint that carries with it a high risk of complications and morbidity.  The differential diagnosis includes CVA, hemorrhagic stroke, mass, Todd's paralysis, seizure, electrolyte abnormality, encephalopathy, complicated migraine  MDM: Patient seen in Emergency Department for evaluation of weakness and neglect.  Physical exam with left upper and left lower extremity weakness with left-sided hemineglect.  Stroke alert called immediately and patient taken emergently for CAT scan.  Dr. Wilford Corner present on telestroke and assisting with care.  Initial CT head with right dense MCA sign.  Follow-up CT angio and perfusion showing a right M1 occlusion and right ICA  occlusion.  Unfortunately, patient required significant assistance at home and as she currently is not able to complete her ADLs by herself she is not a candidate for emergent thrombectomy.  Laboratory evaluation largely unremarkable.  Will require hospital admission for completion of stroke workup.  Patient admitted.   Additional history obtained: -Additional history obtained from husband -External records from outside source obtained and reviewed including: Chart review including previous notes, labs, imaging, consultation notes   Lab Tests: -I ordered, reviewed, and interpreted labs.   The pertinent results include:   Labs Reviewed  COMPREHENSIVE METABOLIC PANEL - Abnormal; Notable for the following components:      Result Value   CO2 16 (*)    Glucose, Bld 142 (*)    Creatinine, Ser 1.21 (*)    GFR, Estimated 46 (*)    All other components within normal limits  CBG MONITORING, ED - Abnormal; Notable for the following components:   Glucose-Capillary 153 (*)    All other components within normal limits  I-STAT CHEM 8, ED - Abnormal; Notable for the following components:   Chloride 112 (*)    Creatinine, Ser 1.20 (*)    Glucose, Bld 135 (*)    Calcium, Ion 1.05 (*)    TCO2 17 (*)    All other components within normal limits  ETHANOL  PROTIME-INR  APTT  CBC  DIFFERENTIAL  RAPID URINE DRUG SCREEN, HOSP PERFORMED  URINALYSIS, ROUTINE W REFLEX MICROSCOPIC  LIPID PANEL  HEMOGLOBIN A1C      EKG   EKG Interpretation Date/Time:  Thursday January 23 2023 18:53:36 EDT Ventricular Rate:  84 PR Interval:  225 QRS Duration:  152 QT Interval:  441 QTC Calculation: 522 R Axis:   -6  Text Interpretation: Sinus rhythm Prolonged PR interval Left bundle branch block Confirmed by Domingos Riggi ([id number]) on 01/24/2023 1:07:34 AM         Imaging Studies ordered: I ordered imaging studies including CT head, CT angio brain and neck, CT perfusion I independently visualized and  interpreted imaging. I agree with the radiologist interpretation   Medicines ordered and prescription drug management: Meds ordered this encounter  Medications   iohexol (OMNIPAQUE) 350 MG/ML injection 80 mL   prochlorperazine (COMPAZINE) injection 10 mg   diphenhydrAMINE (BENADRYL) injection 25 mg    stroke: early stages of recovery book   0.9 %  sodium chloride infusion   OR Linked Order Group    acetaminophen (TYLENOL)  tablet 650 mg    acetaminophen (TYLENOL) 160 MG/5ML solution 650 mg    acetaminophen (TYLENOL) suppository 650 mg   senna-docusate (Senokot-S) tablet 1 tablet   heparin injection 5,000 Units   aspirin EC tablet 81 mg   dextrose 5 % and 0.45 % NaCl infusion   cholestyramine (QUESTRAN) packet 4 g    13 grams added to 2-6 oz of water as needed     rosuvastatin (CRESTOR) tablet 20 mg   ALPRAZolam (XANAX) tablet 0.5 mg    TAKE ONE TABLET BY MOUTH 3 TIMES DAILY AS NEEDED FOR ANXIETY.     risperiDONE (RISPERDAL) tablet 1 mg   venlafaxine XR (EFFEXOR-XR) 24 hr capsule 75 mg    1 capsule in the morning and 2 capsules in the evening     albuterol (VENTOLIN HFA) 108 (90 Base) MCG/ACT inhaler 2 puff   DISCONTD: Budeson-Glycopyrrol-Formoterol 160-9-4.8 MCG/ACT AERO 2 puff   AND Linked Order Group    fluticasone furoate-vilanterol (BREO ELLIPTA) 100-25 MCG/ACT 1 puff    umeclidinium bromide (INCRUSE ELLIPTA) 62.5 MCG/ACT 1 puff    -I have reviewed the patients home medicines and have made adjustments as needed  Critical interventions Stroke alert neurology consultation, consideration of TNK and thrombectomy  Consultations Obtained: I requested consultation with the stroke neurologist Dr. Wilford Corner,  and discussed lab and imaging findings as well as pertinent plan - they recommend: No thrombectomy as patient is unable to complete ADLs at home at baseline   Cardiac Monitoring: The patient was maintained on a cardiac monitor.  I personally viewed and interpreted the cardiac  monitored which showed an underlying rhythm of: NSR  Social Determinants of Health:  Factors impacting patients care include: none   Reevaluation: After the interventions noted above, I reevaluated the patient and found that they have :stayed the same  Co morbidities that complicate the patient evaluation  Past Medical History:  Diagnosis Date   CHF (congestive heart failure) (HCC)    COPD (chronic obstructive pulmonary disease) (HCC)    Coronary artery disease    GAD (generalized anxiety disorder)    GERD (gastroesophageal reflux disease)    Hypercholesteremia    Hypertension       Dispostion: I considered admission for this patient, and patient require hospital admission for completion of stroke workup.     Final Clinical Impression(s) / ED Diagnoses Final diagnoses:  Cerebrovascular accident (CVA), unspecified mechanism [facility])     @PCDICTATION @    Glendora Score, MD 01/24/23 [phone]

## 2023-01-24 NOTE — Plan of Care (Signed)
  Problem: Health Behavior/Discharge Planning: Goal: Ability to manage health-related needs will improve Outcome: Not Progressing   

## 2023-01-24 NOTE — Plan of Care (Signed)
  Problem: Acute Rehab PT Goals(only PT should resolve) Goal: Pt Will Go Supine/Side To Sit Outcome: Progressing Flowsheets (Taken 01/24/2023 1411) Pt will go Supine/Side to Sit: with moderate assist Goal: Patient Will Transfer Sit To/From Stand Outcome: Progressing Flowsheets (Taken 01/24/2023 1411) Patient will transfer sit to/from stand: with moderate assist Goal: Pt Will Transfer Bed To Chair/Chair To Bed Outcome: Progressing Flowsheets (Taken 01/24/2023 1411) Pt will Transfer Bed to Chair/Chair to Bed: with mod assist Goal: Pt Will Ambulate Outcome: Progressing Flowsheets (Taken 01/24/2023 1411) Pt will Ambulate:  10 feet  with moderate assist  with rolling walker   2:12 PM, 01/24/23 Ocie Bob, MPT Physical Therapist with [facility] [phone] office [phone] mobile phone

## 2023-01-24 NOTE — Progress Notes (Signed)
Inpatient Rehab Admissions Coordinator Note:   Per therapy recommendations patient was screened for CIR candidacy by Stephania Fragmin, PT. At this time, pt appears to be a potential candidate for CIR. I will place an order for rehab consult for full assessment, per our protocol.  Please contact me any with questions.Estill Dooms, PT, DPT [phone] 01/24/23 3:07 PM

## 2023-01-24 NOTE — Progress Notes (Signed)
PROGRESS NOTE     Melinda Gomez, is a 77 y.o. female, DOB - 1945-10-09, WUJ:[id number]  Admit date - 01/23/2023   Admitting Physician Amoy Steeves Mariea Clonts, MD  Outpatient Primary MD for the patient is Thana Ates, MD  LOS - 0  Chief Complaint  Patient presents with   Code Stroke       Brief Narrative:  77 y.o. female with medical history significant of CHF, COPD, CAD, GAD, GERD, hyperlipidemia, hypertension admitted on 01/23/2023 with large acute right MCA stroke   -Assessment and Plan: 1)Large Acute Rt MCA CVA  --MRI brain shows Large evolving acute right MCA distribution infarct involving the right temporoccipital region. -CTA head and neck shows right ICA occlusionComplete occlusion of the right ICA just distal to the bifurcation, with reconstitution in the cavernous segment, likely retrograde, with overall poor opacification and possible additional occlusion near the terminus, as well as Occlusion of the right M1, with reconstitution just before the bifurcation, likely via collaterals.   - Neuro was consulted and determined the patient was a poor candidate for TNK and for thrombectomy due to poor functional status - Patient is DNR -Aspirin and Crestor as ordered -LDL is 16 , HDL is 49 -A1c 6.4 -Echo with EF of 55 to 60% without regional wall motion normalities, but has grade 1 diastolic dysfunction.  No AS and no MS -PT OT eval appreciated recommends CIR/inpatient rehab -Speech pathologist eval requested - 2)Chronic systolic CHF (congestive heart failure) (HCC) - Continue crestor  -holding Entresto and spironolactone in the setting of permissive hypertension  3)GAD (generalized anxiety disorder) - Continue Xanax  4)Hypertension - Holding spironolactone, metoprolol Entresto for permissive hypertension  5)COPD (chronic obstructive pulmonary disease) (HCC) -Stable, no acute exacerbation, continue bronchodilators  6) social/ethics Plan of care and advanced directives  discussed with daughter and husband at bedside, patient is a DNR/DNI  Status is: Inpatient   Disposition: The patient is from: Home              Anticipated d/c is to: CIR              Anticipated d/c date is: 2 days              Patient currently is not medically stable to d/c. Barriers: Not Clinically Stable-   Code Status :  -  Code Status: Limited: Do not attempt resuscitation (DNR) -DNR-LIMITED -Do Not Intubate/DNI    Family Communication:   Discussed with daughter and Husband at bedside  DVT Prophylaxis  :   - SCDs  heparin injection 5,000 Units Start: 01/23/23 2200 SCD's Start: 01/23/23 2157   Lab Results  Component Value Date   PLT 198 01/23/2023    Inpatient Medications  Scheduled Meds:  aspirin EC  81 mg Oral Daily   fluticasone furoate-vilanterol  1 puff Inhalation Daily   And   umeclidinium bromide  1 puff Inhalation Daily   heparin  5,000 Units Subcutaneous Q8H   risperiDONE  1 mg Oral QHS   rosuvastatin  20 mg Oral Daily   venlafaxine XR  75 mg Oral Q breakfast   Continuous Infusions:  sodium chloride Stopped (01/23/23 2156)   dextrose 5 % and 0.45 % NaCl 40 mL/hr at 01/24/23 0740   PRN Meds:.acetaminophen **OR** acetaminophen (TYLENOL) oral liquid 160 mg/5 mL **OR** acetaminophen, albuterol, ALPRAZolam, cholestyramine, fentaNYL (SUBLIMAZE) injection, senna-docusate   Anti-infectives (From admission, onward)    None       Subjective: Yulisa Chirico today has no  fevers, no emesis,  No chest pain Husband and daughter are at bedside -Patient is sleepy on and off  Objective: Vitals:   01/24/23 0608 01/24/23 0723 01/24/23 0926 01/24/23 1030  BP:    (!) 144/60  Pulse: 74 77  75  Resp: (!) 22 20  20   Temp: 97.6 F (36.4 C)     TempSrc: Axillary     SpO2: 96% 97% 95% 96%    Intake/Output Summary (Last 24 hours) at 01/24/2023 1426 Last data filed at 01/24/2023 1100 Gross per 24 hour  Intake 0 ml  Output --  Net 0 ml   Physical  Exam  Gen:-Sleepy but arousable, no acute distress HEENT:- [place].AT, No sclera icterus Neck-Supple Neck,No JVD,.  Lungs-  CTAB , fair symmetrical air movement CV- S1, S2 normal, regular  Abd-  +ve B.Sounds, Abd Soft, No tenderness,    Extremity/Skin:- No  edema, pedal pulses present  Psych-affect is flat, able to follow instructions/comments Neuro--left-sided hemiparesis noted, facial Droop and dysarthria noted  Data Reviewed: I have personally reviewed following labs and imaging studies  CBC: Recent Labs  Lab 01/23/23 1739 01/23/23 1747  WBC 8.9  --   NEUTROABS 6.2  --   HGB 14.2 13.9  HCT 44.9 41.0  MCV 90.2  --   PLT 198  --    Basic Metabolic Panel: Recent Labs  Lab 01/23/23 1739 01/23/23 1747  NA 136 137  K 4.1 4.1  CL 110 112*  CO2 16*  --   GLUCOSE 142* 135*  BUN 16 17  CREATININE 1.21* 1.20*  CALCIUM 9.1  --    Liver Function Tests: Recent Labs  Lab 01/23/23 1739  AST 20  ALT 23  ALKPHOS 59  BILITOT 0.3  PROT 7.2  ALBUMIN 4.0   HbA1C: Recent Labs    01/23/23 1739  HGBA1C 6.4*   Radiology Studies: ECHOCARDIOGRAM COMPLETE  Result Date: 01/24/2023    ECHOCARDIOGRAM REPORT   Patient Name:   Melinda Gomez Date of Exam: 01/24/2023 Medical Rec #:  [id number]       Height:       62.0 in Accession #:    [id number]      Weight:       157.2 lb Date of Birth:  [birth year]        BSA:          1.726 m Patient Age:    77 years        BP:           143/64 mmHg Patient Gender: F               HR:           77 bpm. [facility] Procedure: 2D Echo, Cardiac Doppler and Color Doppler Indications:    Stroke I63.9  History:        Patient has prior history of Echocardiogram examinations, most                 recent 05/14/2022. Risk Factors:Hypertension, Diabetes,                 Dyslipidemia and Former Smoker.  Sonographer:    Celesta Gentile RCS Referring Phys: [id number] ASIA B ZIERLE-GHOSH  Sonographer Comments: Technically difficult study due to poor echo windows.  IMPRESSIONS  1. Left ventricular ejection fraction, by estimation, is 55 to 60%. The left ventricle has normal function. The left ventricle has no regional wall motion abnormalities. Left  ventricular diastolic parameters are consistent with Grade I diastolic dysfunction (impaired relaxation). Elevated left ventricular end-diastolic pressure.  2. Right ventricular systolic function is low normal. The right ventricular size is normal. Moderately increased right ventricular wall thickness.  3. The mitral valve is normal in structure. No evidence of mitral valve regurgitation. No evidence of mitral stenosis.  4. The aortic valve is tricuspid. Aortic valve regurgitation is not visualized. No aortic stenosis is present.  5. The inferior vena cava is normal in size but collapsibility could not be evaluated. Comparison(s): No significant change from prior study. Conclusion(s)/Recommendation(s): No left ventricular mural or apical thrombus/thrombi. FINDINGS  Left Ventricle: Left ventricular ejection fraction, by estimation, is 55 to 60%. The left ventricle has normal function. The left ventricle has no regional wall motion abnormalities. Definity contrast agent was given IV to delineate the left ventricular  endocardial borders. The left ventricular internal cavity size was normal in size. There is no left ventricular hypertrophy. Abnormal (paradoxical) septal motion, consistent with left bundle branch block. Left ventricular diastolic parameters are consistent with Grade I diastolic dysfunction (impaired relaxation). Elevated left ventricular end-diastolic pressure. Right Ventricle: The right ventricular size is normal. Moderately increased right ventricular wall thickness. Right ventricular systolic function is low normal. Left Atrium: Left atrial size was normal in size. Right Atrium: Right atrial size was normal in size. Pericardium: There is no evidence of pericardial effusion. Mitral Valve: The mitral valve is normal  in structure. No evidence of mitral valve regurgitation. No evidence of mitral valve stenosis. Tricuspid Valve: The tricuspid valve is not well visualized. Tricuspid valve regurgitation is not demonstrated. No evidence of tricuspid stenosis. Aortic Valve: The aortic valve is tricuspid. Aortic valve regurgitation is not visualized. No aortic stenosis is present. Pulmonic Valve: The pulmonic valve was not well visualized. Pulmonic valve regurgitation is not visualized. No evidence of pulmonic stenosis. Aorta: The aortic root is normal in size and structure. Venous: The inferior vena cava is normal in size but collapsibility could not be evaluated. IAS/Shunts: The interatrial septum was not well visualized.  LEFT VENTRICLE PLAX 2D LVIDd:         4.40 cm   Diastology LVIDs:         2.40 cm   LV e' medial:    4.79 cm/s LV PW:         0.90 cm   LV E/e' medial:  15.6 LV IVS:        0.90 cm   LV e' lateral:   5.33 cm/s LVOT diam:     2.00 cm   LV E/e' lateral: 14.0 LV SV:         63 LV SV Index:   37 LVOT Area:     3.14 cm  RIGHT VENTRICLE RV S prime:     8.49 cm/s TAPSE (M-mode): 1.5 cm LEFT ATRIUM             Index        RIGHT ATRIUM           Index LA diam:        2.90 cm 1.68 cm/m   RA Area:     11.10 cm LA Vol (A2C):   40.9 ml 23.70 ml/m  RA Volume:   22.30 ml  12.92 ml/m LA Vol (A4C):   40.8 ml 23.64 ml/m LA Biplane Vol: 40.9 ml 23.70 ml/m  AORTIC VALVE LVOT Vmax:   102.00 cm/s LVOT Vmean:  67.000 cm/s LVOT VTI:  0.202 m  AORTA Ao Root diam: 3.00 cm MITRAL VALVE MV Area (PHT): 2.91 cm     SHUNTS MV Decel Time: 261 msec     Systemic VTI:  0.20 m MV E velocity: 74.70 cm/s   Systemic Diam: 2.00 cm MV A velocity: 101.00 cm/s MV E/A ratio:  0.74 Vishnu Priya Mallipeddi Electronically signed by Winfield Rast Mallipeddi Signature Date/Time: 01/24/2023/1:18:03 PM    Final    MR BRAIN WO CONTRAST  Result Date: 01/24/2023 CLINICAL DATA:  Follow-up examination for stroke. EXAM: MRI HEAD WITHOUT CONTRAST TECHNIQUE:  Multiplanar, multiecho pulse sequences of the brain and surrounding structures were obtained without intravenous contrast. COMPARISON:  Prior CTs from earlier the same day. FINDINGS: Brain: Mild age-related cerebral atrophy. Patchy and confluent T2/FLAIR hyperintensity involving the periventricular and deep white matter both cerebral hemispheres, distant with chronic small vessel ischemic disease, moderate to advanced in nature. Mild patchy involvement of the pons noted. Large confluent area of restricted diffusion involving the right temporal occipital region, consistent with an evolving acute right MCA distribution infarct (series 5, image 23). No associated hemorrhage. Early gyral swelling and edema without significant regional mass effect at this time. No other evidence for acute or subacute ischemia elsewhere. No acute or chronic intracranial blood products. No mass lesion, midline shift or mass effect. No hydrocephalus or extra-axial fluid collection. Pituitary gland and suprasellar region within normal limits. Vascular: Loss of normal flow void within the right ICA to the terminus, consistent with previously identified occlusion. Irregular and attenuated flow related signal noted within the right MCA distribution as well. Skull and upper cervical spine: Craniocervical junction within normal limits. Bone marrow signal intensity normal. No scalp soft tissue abnormality. Sinuses/Orbits: Right gaze preference. Prior bilateral ocular lens replacement. Changes of chronic left sphenoid sinusitis. Paranasal sinuses are otherwise clear. Trace right mastoid effusion, of doubtful significance. Other: None. IMPRESSION: 1. Large evolving acute right MCA distribution infarct involving the right temporoccipital region. No associated hemorrhage or significant regional mass effect at this time. 2. Loss of normal flow void within the right ICA and MCA as above, consistent with previously identified occlusion. Findings better  characterized on prior CTA. 3. Underlying age-related cerebral atrophy with moderate to advanced chronic microvascular ischemic disease. Electronically Signed   By: Rise Mu M.D.   On: 01/24/2023 00:27   CT ANGIO HEAD NECK W WO CM W PERF (CODE STROKE)  Result Date: 01/23/2023 CLINICAL DATA:  Left-sided weakness, left-sided facial droop EXAM: CT ANGIOGRAPHY HEAD AND NECK WITH AND WITHOUT CONTRAST CT PERFUSION TECHNIQUE: Multidetector CT imaging of the head and neck was performed using the standard protocol during bolus administration of intravenous contrast. Multiplanar CT image reconstructions and MIPs were obtained to evaluate the vascular anatomy. Carotid stenosis measurements (when applicable) are obtained utilizing NASCET criteria, using the distal internal carotid diameter as the denominator. Multiphase CT imaging of the brain was performed following IV bolus contrast injection. Subsequent parametric perfusion maps were calculated using RAPID software. RADIATION DOSE REDUCTION: This exam was performed according to the departmental dose-optimization program which includes automated exposure control, adjustment of the mA and/or kV according to patient size and/or use of iterative reconstruction technique. COMPARISON:  CT head 01/23/2023 FINDINGS: CT HEAD FINDINGS For noncontrast findings, please see same day CT head. CTA NECK FINDINGS Aortic arch: Choose 1 aortic atherosclerosis. Right carotid system: Complete occlusion of the right ICA just distal to the bifurcation (series 6, image 229), without evidence of reconstitution until the cavernous segment. The  common carotid and external carotid are patent. Left carotid system: 50% stenosis in the proximal left ICA. No evidence of dissection. Vertebral arteries: No evidence of dissection, occlusion, or hemodynamically significant stenosis (greater than 50%). Skeleton: No acute osseous abnormality. Degenerative changes in the cervical spine. Other  neck: No acute finding. Upper chest: Multiple small pulmonary nodules in the right upper lobe, the largest of which measures up to 5 mm. No pleural effusion. Review of the MIP images confirms the above findings CTA HEAD FINDINGS Anterior circulation: Reconstitution of the right ICA in the cavernous segment, likely retrograde, with overall poor opacification and possible additional occlusion near the terminus (appear 6, image 106). The left ICA is patent to the terminus without significant stenosis. Occlusion of the right M1 (series 6, image 104), with reconstitution just before the bifurcation, likely via collaterals. The right MCA branches appear perfused, although the degree of opacification is diminished compared to the left MCA. No focal stenosis in the right M 2 and more distal MCA branches. The left MCA is patent to its distal aspects without significant stenosis. A1 segments patent. Normal anterior communicating artery. Anterior cerebral arteries are patent to their distal aspects without significant stenosis. No M1 stenosis or occlusion. MCA branches perfused to their distal aspects without significant stenosis. Posterior circulation: Vertebral arteries patent to the vertebrobasilar junction without significant stenosis. Posterior inferior cerebellar arteries patent proximally. Basilar patent to its distal aspect without significant stenosis. Superior cerebellar arteries patent proximally. Patent left P1. Likely hypoplastic right P1. Near fetal origin of the right PCA from the right posterior communicating artery, which appears thrombosed near the ICA terminus (series 6, image 111-112). The right PCA and its branches are diminished in opacification compared to the left PCA. The left PCA is patent from its origin through its distal branches, without significant stenosis. The left posterior communicating artery is not definitively visualized. Venous sinuses: As permitted by contrast timing, patent. Anatomic  variants: Near fetal origin of the right PCA. No evidence of aneurysm or vascular malformation. Review of the MIP images confirms the above findings CT Brain Perfusion Findings: ASPECTS: 9 CBF (<30%) Volume: 58mL Perfusion (Tmax>6.0s) volume: Mismatch Volume: Infarction Location:Infarct is primarily in the right temporal lobe and inferior frontal lobe, in the right MCA and, to a lesser extent, some of the PCA territory, with the penumbra involving the remainder of the right MCA territory and some of the right PCA territory. IMPRESSION: 1. Complete occlusion of the right ICA just distal to the bifurcation, with reconstitution in the cavernous segment, likely retrograde, with overall poor opacification and possible additional occlusion near the terminus. 2. Occlusion of the right M1, with reconstitution just before the bifurcation, likely via collaterals. The right MCA branches appear perfused, although the degree of opacification is diminished compared to the left MCA. 3. Near fetal origin of the right PCA from the right posterior communicating artery, which appears occluded near the ICA terminus. The right PCA and its branches are diminished in opacification compared to the left PCA. 4. On perfusion imaging, the infarct core (58 mL) is in both the right MCA and PCA territory. 159 mL penumbra, with 101 mL mismatch 5. 50% stenosis in the proximal left ICA. 6. Multiple small pulmonary nodules in the right upper lobe, the largest of which measures up to 5 mm. No follow-up needed if patient is low-risk (and has no known or suspected primary neoplasm). Non-contrast chest CT can be considered in 12 months if patient is high-risk.  This recommendation follows the consensus statement: Guidelines for Management of Incidental Pulmonary Nodules Detected on CT Images: From the Fleischner Society 2017; Radiology 2017; 284:228-243. 7. Aortic atherosclerosis. Aortic Atherosclerosis (ICD10-I70.0). Code stroke imaging  results from the CTA were communicated on 01/23/2023 at 6:03 pm to provider [facility] and at 6:15 p.m. to Dr. Wilford Corner via telephone, who verbally acknowledged these results. Electronically Signed   By: Wiliam Ke M.D.   On: 01/23/2023 18:19   CT HEAD CODE STROKE WO CONTRAST  Result Date: 01/23/2023 CLINICAL DATA:  Code stroke.  Stroke suspected EXAM: CT HEAD WITHOUT CONTRAST TECHNIQUE: Contiguous axial images were obtained from the base of the skull through the vertex without intravenous contrast. RADIATION DOSE REDUCTION: This exam was performed according to the departmental dose-optimization program which includes automated exposure control, adjustment of the mA and/or kV according to patient size and/or use of iterative reconstruction technique. COMPARISON:  None Available. FINDINGS: Brain: Possible hypodensity and loss of gray-white differentiation in the right anterior temporal lobe, including the anterior insula. No evidence of acute hemorrhage, mass, mass effect, or midline shift. No hydrocephalus or extra-axial collection. Periventricular white matter changes, likely the sequela of chronic small vessel ischemic disease. Vascular: Possible hyperdense right MCA. Skull: Negative for fracture or focal lesion. Sinuses/Orbits: Chronic left sphenoid sinusitis, with complete opacification and osseous thickening. Otherwise clear paranasal sinuses. Status post bilateral lens replacements. Other: The mastoid air cells are well aerated. ASPECTS [facility] Stroke Program Early CT Score) - Ganglionic level infarction (caudate, lentiform nuclei, internal capsule, insula, M1-M3 cortex): 6 - Supraganglionic infarction (M4-M6 cortex): 3 Total score (0-10 with 10 being normal): 10 IMPRESSION: 1. Possible hypodensity and loss of gray-white differentiation in the right anterior temporal lobe, including the insula, which could represent an acute infarct. Possible hyperdense right MCA. No hemorrhage. 2. ASPECTS is 10. Code stroke  imaging results were communicated on 01/23/2023 at 5:51 pm to provider [facility] via telephone, who verbally acknowledged these results. Electronically Signed   By: Wiliam Ke M.D.   On: 01/23/2023 17:53    Scheduled Meds:  aspirin EC  81 mg Oral Daily   fluticasone furoate-vilanterol  1 puff Inhalation Daily   And   umeclidinium bromide  1 puff Inhalation Daily   heparin  5,000 Units Subcutaneous Q8H   risperiDONE  1 mg Oral QHS   rosuvastatin  20 mg Oral Daily   venlafaxine XR  75 mg Oral Q breakfast   Continuous Infusions:  sodium chloride Stopped (01/23/23 2156)   dextrose 5 % and 0.45 % NaCl 40 mL/hr at 01/24/23 0740    LOS: 0 days   Shon Hale M.D on 01/24/2023 at 2:26 PM  Go to [email or web address] - for contact info  [facility] - Office  [phone]  If 7PM-7AM, please contact night-coverage [email or web address] 01/24/2023, 2:26 PM

## 2023-01-24 NOTE — ED Notes (Signed)
Attempted report 

## 2023-01-24 NOTE — Progress Notes (Signed)
SLP Cancellation Note  Patient Details Name: Melinda Gomez MRN: [id number] DOB: 09-Oct-1945   Cancelled treatment:       Reason Eval/Treat Not Completed: Fatigue/lethargy limiting ability to participate;Patient's level of consciousness. Attempted to rouse Pt; she did respond but was unable to open her eyes or maintain alertness for PO trials. ST will re-attempted later today as schedule permits. Thank you,  Fateh Kindle H. Romie Levee, CCC-SLP Speech Language Pathologist    [name] 01/24/2023, 10:54 AM

## 2023-01-24 NOTE — Progress Notes (Signed)
*  PRELIMINARY RESULTS* Echocardiogram 2D Echocardiogram has been performed with Definity.  Stacey Drain 01/24/2023, 11:33 AM

## 2023-01-24 NOTE — Plan of Care (Signed)
  Problem: Acute Rehab OT Goals (only OT should resolve) Goal: Pt. Will Perform Eating Flowsheets (Taken 01/24/2023 1025) Pt Will Perform Eating:  with modified independence  sitting  with set-up Goal: Pt. Will Perform Grooming Flowsheets (Taken 01/24/2023 1025) Pt Will Perform Grooming:  with min assist  sitting Goal: Pt. Will Perform Upper Body Bathing Flowsheets (Taken 01/24/2023 1025) Pt Will Perform Upper Body Bathing:  with min assist  sitting Goal: Pt. Will Perform Upper Body Dressing Flowsheets (Taken 01/24/2023 1025) Pt Will Perform Upper Body Dressing:  with min assist  sitting Goal: Pt. Will Perform Lower Body Dressing Flowsheets (Taken 01/24/2023 1025) Pt Will Perform Lower Body Dressing:  with mod assist  with adaptive equipment  sitting/lateral leans Goal: Pt. Will Transfer To Toilet Flowsheets (Taken 01/24/2023 1025) Pt Will Transfer to Toilet:  with min assist  with mod assist  stand pivot transfer Goal: Pt. Will Perform Toileting-Clothing Manipulation Flowsheets (Taken 01/24/2023 1025) Pt Will Perform Toileting - Clothing Manipulation and hygiene:  with min assist  with mod assist  sitting/lateral leans Goal: Pt/Caregiver Will Perform Home Exercise Program Flowsheets (Taken 01/24/2023 1025) Pt/caregiver will Perform Home Exercise Program:  Increased ROM  Increased strength  Right Upper extremity  Left upper extremity  With minimal assist  Andrina Locken OT, MOT

## 2023-01-24 NOTE — Evaluation (Signed)
Physical Therapy Evaluation Patient Details Name: Melinda Gomez MRN: [id number] DOB: Feb 02, 1946 Today's Date: 01/24/2023  History of Present Illness  Melinda Gomez is a 77 y.o. female with medical history significant of CHF, COPD, CAD, GAD, GERD, hyperlipidemia, hypertension, presents to the ED with a chief complaint of difficulty swallowing.  Husband is patient's primary caretaker.  He reports he last saw her mid afternoon when he went out to work in the yard.  When he came back and she was complaining of a headache and asked for her rizatriptan.  It was the first time she had asked for it in 3 days.  He noted something was wrong when she could not pick it up with her hand.  Her left hand was not moving at all and her right hand could not get coordinated enough to pick it up.  He put in her mouth give her some Pepsi and she had a lot of difficulty swallowing it.  He does think she got it down.  After that he noted that her speech was not clear.  She had a left-sided droop as well.  He reports that prior to that she had been doing very well.  She even ambulated on her own to the bathroom and mostly took care of that herself except for redressing after using the toilet.  She has not been complaining of anything to him.  He called EMS who advised him to evaluate her pronator drift.  She could not lift her left arm.  They brought her in for stroke evaluation.  Unfortunately, patient is not able to provide much history as she is quite somnolent after her headache cocktail.   Clinical Impression  Patient demonstrates slow labored movement for sitting up at bedside, once seated had frequent posterior leaning requiring Mod assist to maintain sitting balance, very unsteady on feet and limited to a few side steps having to shuffle LLE due to weakness and inability to lift off floor.  Patient put back to bed with Max assist to reposition.  Patient will benefit from continued skilled physical therapy in hospital and  recommended venue below to increase strength, balance, endurance for safe ADLs and gait.          If plan is discharge home, recommend the following: A lot of help with bathing/dressing/bathroom;A lot of help with walking and/or transfers;Help with stairs or ramp for entrance;Assistance with cooking/housework   Can travel by private vehicle        Equipment Recommendations None recommended by PT  Recommendations for Other Services       Functional Status Assessment Patient has had a recent decline in their functional status and demonstrates the ability to make significant improvements in function in a reasonable and predictable amount of time.     Precautions / Restrictions Precautions Precautions: Fall Restrictions Weight Bearing Restrictions: No      Mobility  Bed Mobility Overal bed mobility: Needs Assistance Bed Mobility: Supine to Sit, Sit to Supine     Supine to sit: Max assist Sit to supine: Max assist   General bed mobility comments: Much assist; little active assist for mobility from pt.    Transfers Overall transfer level: Needs assistance Equipment used: Rolling walker (2 wheels) Transfers: Sit to/from Stand, Bed to chair/wheelchair/BSC Sit to Stand: Max assist           General transfer comment: very unsteady on feet with with diffiuclty holding onto RW using LUE    Ambulation/Gait Ambulation/Gait assistance: Max  assist Gait Distance (Feet): 3 Feet Assistive device: Rolling walker (2 wheels) Gait Pattern/deviations: Decreased step length - right, Decreased step length - left, Decreased stride length, Shuffle Gait velocity: slow     General Gait Details: limited to a few slow labored side steps with shuffling fo LLE due to unable to lift off floor  Stairs            Wheelchair Mobility     Tilt Bed    Modified Rankin (Stroke Patients Only)       Balance Overall balance assessment: Needs assistance Sitting-balance support: Feet  supported, No upper extremity supported Sitting balance-Leahy Scale: Poor Sitting balance - Comments: seated at EOB Postural control: Posterior lean, Left lateral lean Standing balance support: Single extremity supported, During functional activity, Reliant on assistive device for balance Standing balance-Leahy Scale: Poor Standing balance comment: using RW                             Pertinent Vitals/Pain Pain Assessment Pain Assessment: Faces Faces Pain Scale: No hurt    Home Living Family/patient expects to be discharged to:: Private residence Living Arrangements: Spouse/significant other Available Help at Discharge: Family;Available 24 hours/day Type of Home: House Home Access: Stairs to enter Entrance Stairs-Rails: Doctor, general practice of Steps: 1   Home Layout: Laundry or work area in basement;Able to live on main level with bedroom/bathroom Home Equipment: Rollator (4 wheels);Rolling Walker (2 wheels);BSC/3in1;Grab bars - tub/shower;Grab bars - toilet;Wheelchair - manual;Tub bench      Prior Function Prior Level of Function : Needs assist       Physical Assist : ADLs (physical);Mobility (physical) Mobility (physical): Gait ADLs (physical): Dressing;Bathing;IADLs Mobility Comments: [place]ous[place] ambulator with rollator. W/c used in the community. ADLs Comments: Assist for bathing, dressing, and toilet transfer. Pt able to compelte peri-care, feed, and groom.     Extremity/Trunk Assessment   Upper Extremity Assessment Upper Extremity Assessment: Defer to OT evaluation    Lower Extremity Assessment Lower Extremity Assessment: Generalized weakness;LLE deficits/detail LLE Deficits / Details: grossly -3/5 LLE Sensation: decreased light touch;decreased proprioception LLE Coordination: decreased fine motor;decreased gross motor    Cervical / Trunk Assessment Cervical / Trunk Assessment: Normal  Communication   Communication Communication:  Difficulty communicating thoughts/reduced clarity of speech;Difficulty following commands/understanding Following commands: Follows one step commands inconsistently Cueing Techniques: Verbal cues;Tactile cues  Cognition Arousal: Lethargic Behavior During Therapy: Flat affect Overall Cognitive Status: Impaired/Different from baseline Area of Impairment: Attention                   Current Attention Level: Focused, Sustained           General Comments: very lethargic, became more alert upon sitting up at bedside        General Comments      Exercises     Assessment/Plan    PT Assessment Patient needs continued PT services  PT Problem List Decreased strength;Decreased activity tolerance;Decreased balance;Decreased mobility;Impaired sensation;Decreased coordination       PT Treatment Interventions DME instruction;Gait training;Functional mobility training;Therapeutic activities;Therapeutic exercise;Balance training;Patient/family education;Neuromuscular re-education    PT Goals (Current goals can be found in the Care Plan section)  Acute Rehab PT Goals Patient Stated Goal: return home PT Goal Formulation: With patient/family Time For Goal Achievement: 03/07/23 Potential to Achieve Goals: Fair    Frequency Min 4X/week     Co-evaluation PT/OT/SLP Co-Evaluation/Treatment: Yes Reason for Co-Treatment: Complexity of the patient's impairments (multi-system  involvement) PT goals addressed during session: Mobility/safety with mobility;Balance;Proper use of DME         AM-PAC PT "6 Clicks" Mobility  Outcome Measure Help needed turning from your back to your side while in a flat bed without using bedrails?: A Lot Help needed moving from lying on your back to sitting on the side of a flat bed without using bedrails?: A Lot Help needed moving to and from a bed to a chair (including a wheelchair)?: A Lot Help needed standing up from a chair using your arms (e.g.,  wheelchair or bedside chair)?: A Lot Help needed to walk in hospital room?: A Lot Help needed climbing 3-5 steps with a railing? : Total 6 Click Score: 11    End of Session   Activity Tolerance: Patient tolerated treatment well;Patient limited by fatigue Patient left: in bed;with call bell/phone within reach;with family/visitor present Nurse Communication: Mobility status PT Visit Diagnosis: Unsteadiness on feet (R26.81);Other abnormalities of gait and mobility (R26.89);Muscle weakness (generalized) (M62.81)    Time: 6387-5643 PT Time Calculation (min) (ACUTE ONLY): 20 min   Charges:   PT Evaluation $PT Eval Moderate Complexity: 1 Mod PT Treatments $Therapeutic Activity: 8-22 mins PT General Charges $$ ACUTE PT VISIT: 1 Visit         2:10 PM, 01/24/23 Ocie Bob, MPT Physical Therapist with [facility] 336 [phone] office [phone] mobile phone

## 2023-01-24 NOTE — TOC Initial Note (Signed)
Transition of Care [facility]) - Initial/Assessment Note    Patient Details  Name: Melinda Gomez MRN: [id number] Date of Birth: 1945/12/07  [facility]) CM/SW Contact:    Leitha Bleak, RN Phone Number: 01/24/2023, 11:20 AM  Clinical Narrative:     Patient admitted with CVA. PT is recommending CIR. CM spoke with her daughter. They are agreeable, waiting for CIR to review and approve.  Patient lives at home with her husband, normally walks with a walker. TOC following.                Barriers to Discharge: Continued Medical Work up   Patient Goals and CMS Choice Patient states their goals for this hospitalization and ongoing recovery are:: agreeable to CIR, waiting approval CMS Medicare.gov Compare Post Acute Care list provided to:: Patient Represenative (must comment) Choice offered to / list presented to : Spouse    xpected Discharge Plan and Services      Living arrangements for the past 2 months: Single Family Home        Prior Living Arrangements/Services Living arrangements for the past 2 months: Single Family Home Lives with:: Spouse Patient language and need for interpreter reviewed:: Yes        Need for Family Participation in Patient Care: Yes (Comment) Care giver support system in place?: Yes (comment)   Criminal Activity/Legal Involvement Pertinent to Current Situation/Hospitalization: No - Comment as needed  Activities of Daily Living      Permission Sought/Granted          Permission granted to share info w Relationship: daughter     Emotional Assessment     Affect (typically observed): Accepting Orientation: : Oriented to Self, Oriented to Situation Alcohol / Substance Use: Not Applicable Psych Involvement: No (comment)  Admission diagnosis:  CVA (cerebral vascular accident) (HCC) [I63.9] Cerebrovascular accident (CVA), unspecified mechanism (HCC) [I63.9] Patient Active Problem List   Diagnosis Date Noted   CVA (cerebral vascular  accident) (HCC) 01/23/2023   Bronchiectasis without complication (HCC) 11/07/2022   Acute hypoxic respiratory failure (HCC) 10/02/2022   CAP (community acquired pneumonia) 10/02/2022   Sacral decubitus ulcer 10/02/2022   Chronic systolic CHF (congestive heart failure) (HCC) 05/01/2021   Hypokalemia 02/23/2021   Hypertension    Hypercholesteremia    COPD (chronic obstructive pulmonary disease) (HCC)    GAD (generalized anxiety disorder)    Chest pain 02/22/2021   PTSD (post-traumatic stress disorder) 01/02/2018   GERD (gastroesophageal reflux disease) 01/02/2018   Chronic insomnia 01/02/2018   PCP:  Thana Ates, MD Pharmacy:   [facility] [place], [place] - [id number] [place] [address] Phone: [phone] Fax: [phone]  [facility] [place], [place] - [address] [place] [address] Phone: [phone] Fax: [phone]  [facility] - [place], [place] - [address] [place] [address] Phone: [phone] Fax: [phone]   Social Determinants of Health (SDOH) Social History: SDOH Screenings   Tobacco Use: Medium Risk (01/14/2023)   SDOH Interventions:

## 2023-01-24 NOTE — ED Notes (Signed)
Attending at bedside.

## 2023-01-25 DIAGNOSIS — I5022 Chronic systolic (congestive) heart failure: Secondary | ICD-10-CM | POA: Diagnosis not present

## 2023-01-25 DIAGNOSIS — I639 Cerebral infarction, unspecified: Secondary | ICD-10-CM | POA: Diagnosis not present

## 2023-01-25 DIAGNOSIS — I10 Essential (primary) hypertension: Secondary | ICD-10-CM | POA: Diagnosis not present

## 2023-01-25 LAB — GLUCOSE, CAPILLARY
Glucose-Capillary: 122 mg/dL — ABNORMAL HIGH (ref 70–99)
Glucose-Capillary: 123 mg/dL — ABNORMAL HIGH (ref 70–99)
Glucose-Capillary: 136 mg/dL — ABNORMAL HIGH (ref 70–99)
Glucose-Capillary: 138 mg/dL — ABNORMAL HIGH (ref 70–99)
Glucose-Capillary: 143 mg/dL — ABNORMAL HIGH (ref 70–99)

## 2023-01-25 MED ORDER — SODIUM CHLORIDE 0.9 % IV SOLN: INTRAVENOUS | Status: AC

## 2023-01-25 MED ORDER — ASPIRIN 300 MG RE SUPP
300.0000 mg | Freq: Once | RECTAL | Status: AC
  Administered 2023-01-25: 300 mg via RECTAL
  Filled 2023-01-25: qty 1

## 2023-01-25 MED ORDER — METOPROLOL TARTRATE 5 MG/5ML IV SOLN
5.0000 mg | INTRAVENOUS | Status: DC | PRN
  Administered 2023-01-25: 5 mg via INTRAVENOUS
  Filled 2023-01-25: qty 5

## 2023-01-25 NOTE — Progress Notes (Signed)
MEWS Progress Note  Patient Details Name: TAYTEM GHATTAS MRN: [id number] DOB: 1945/11/18 Today's Date: 01/25/2023   MEWS Flowsheet Documentation:  Assess: MEWS Score Temp: (!) 100.9 F (38.3 C) BP: (!) 154/98 MAP (mmHg): 114 Pulse Rate: (!) 112 ECG Heart Rate: (!) 115 Resp: 20 Level of Consciousness: Responds to Voice SpO2: 94 % O2 Device: Room Air Assess: MEWS Score MEWS Temp: 1 MEWS Systolic: 0 MEWS Pulse: 2 MEWS RR: 0 MEWS LOC: 0 MEWS Score: 3 MEWS Score Color: Yellow Assess: SIRS CRITERIA SIRS Temperature : 0 SIRS Respirations : 0 SIRS Pulse: 1 SIRS WBC: 0 SIRS Score Sum : 1 SIRS Temperature : 0 SIRS Pulse: 1 SIRS Respirations : 0 SIRS WBC: 0 SIRS Score Sum : 1 Assess: if the MEWS score is Yellow or Red Were vital signs accurate and taken at a resting state?: Yes Does the patient meet 2 or more of the SIRS criteria?: No MEWS guidelines implemented : Yes, yellow Treat MEWS Interventions: Considered administering scheduled or prn medications/treatments as ordered Take Vital Signs Increase Vital Sign Frequency : Yellow: Q2hr x1, continue Q4hrs until patient remains green for 12hrs Escalate MEWS: Escalate: Yellow: Discuss with charge nurse and consider notifying provider and/or RRT Notify: Charge Nurse/RN Name of Charge Nurse/RN Notified: Engineer, maintenance Provider Notification Provider Name/Title: Shon Hale MD Date Provider Notified: 01/25/23 Time Provider Notified: 1801 Method of Notification: Page Notification Reason: Other (Comment) (HR 110s) Provider response: See new orders Date of Provider Response: 01/25/23 Time of Provider Response: 1803 Notify: Rapid Response Name of Rapid Response RN Notified: Morrie Sheldon RN Date Rapid Response Notified: 01/25/23 Time Rapid Response Notified: 1823      Cathren Laine 01/25/2023, 6:23 PM

## 2023-01-25 NOTE — Progress Notes (Signed)
PROGRESS NOTE  Melinda Gomez, is a 77 y.o. female, DOB - 1945-08-25, NUU:[id number]  Admit date - 01/23/2023   Admitting Physician Melinda Cobern Mariea Clonts, MD  Outpatient Primary MD for the patient is Melinda Ates, MD  LOS - 1  Chief Complaint  Patient presents with   Code Stroke       Brief Narrative:  77 y.o. female with medical history significant of CHF, COPD, CAD, GAD, GERD, hyperlipidemia, hypertension admitted on 01/23/2023 with large acute right MCA stroke   -Assessment and Plan: 1)Large Acute Rt MCA CVA  --MRI brain shows Large evolving acute right MCA distribution infarct involving the right temporoccipital region. -CTA head and neck shows right ICA occlusionComplete occlusion of the right ICA just distal to the bifurcation, with reconstitution in the cavernous segment, likely retrograde, with overall poor opacification and possible additional occlusion near the terminus, as well as Occlusion of the right M1, with reconstitution just before the bifurcation, likely via collaterals.   - Neuro was consulted and determined the patient was a poor candidate for TNK and for thrombectomy due to poor functional status -LDL is 16 , HDL is 49----Even if her lipid panel is within desired limits, patient should still take Lipitor/Statin for it's Pleiotropic effects (beyond cholesterol lowering benefits) -A1c 6.4 -Echo with EF of 55 to 60% without regional wall motion normalities, but has grade 1 diastolic dysfunction.  No AS and no MS -PT OT eval appreciated recommends CIR/inpatient rehab 01/25/23 -Speech and swallowing concerns persist --speech pathologist eval requested --Less sleepy, able to answer simple questions and interact with family at bedside --Aspirin and Crestor as ordered (rectal aspirin until oral intake is reliable) - 2)Chronic systolic CHF (congestive heart failure) (HCC) - -holding Entresto, metoprolol and spironolactone in the setting of permissive hypertension -May use IV  metoprolol for tachycardia  3)GAD (generalized anxiety disorder) - Continue Xanax prn  4)Hypertension - Holding spironolactone, metoprolol, Entresto for permissive hypertension -May use IV metoprolol as needed tachycardia  5)COPD (chronic obstructive pulmonary disease) (HCC) -Stable, no acute exacerbation, continue bronchodilators  6) social/ethics Plan of care and advanced directives discussed with daughter and husband at bedside, patient is a DNR/DNI  7)Fevers--- T max 100.9 -Check UA and check chest x-ray  8)FEN--gentle IV hydration until oral intake resumes  Status is: Inpatient   Disposition: The patient is from: Home              Anticipated d/c is to: CIR              Anticipated d/c date is: 2 days              Patient currently is not medically stable to d/c. Barriers: Not Clinically Stable-   Code Status :  -  Code Status: Limited: Do not attempt resuscitation (DNR) -DNR-LIMITED -Do Not Intubate/DNI    Family Communication:   Discussed with daughter and Husband at bedside  DVT Prophylaxis  :   - SCDs  heparin injection 5,000 Units Start: 01/23/23 2200 SCD's Start: 01/23/23 2157   Lab Results  Component Value Date   PLT 198 01/23/2023   Inpatient Medications  Scheduled Meds:  aspirin EC  81 mg Oral Daily   fluticasone furoate-vilanterol  1 puff Inhalation Daily   And   umeclidinium bromide  1 puff Inhalation Daily   heparin  5,000 Units Subcutaneous Q8H   risperiDONE  1 mg Oral QHS   rosuvastatin  20 mg Oral Daily   venlafaxine XR  75 mg  Oral Q breakfast   Continuous Infusions:  sodium chloride 83 mL/hr at 01/25/23 1648   PRN Meds:.acetaminophen **OR** acetaminophen (TYLENOL) oral liquid 160 mg/5 mL **OR** acetaminophen, albuterol, ALPRAZolam, cholestyramine, fentaNYL (SUBLIMAZE) injection, metoprolol tartrate, senna-docusate   Anti-infectives (From admission, onward)    None       Subjective: Melinda Gomez today has no emesis,  No chest  pain Husband and daughters Melinda Gomez and Melinda Gomez)  are at bedside - less sleepy, --- interacting with family and staff -Failed swallow eval  Objective: Vitals:   01/25/23 1418 01/25/23 1600 01/25/23 1800 01/25/23 1830  BP: (!) 164/82 (!) 158/99 (!) 154/98 (!) 124/100  Pulse: (!) 109 (!) 118 (!) 112 98  Resp: 18 18 20 20   Temp: 98.3 F (36.8 C) 100 F (37.8 C) (!) 100.9 F (38.3 C) 98.4 F (36.9 C)  TempSrc: Oral Oral Axillary Axillary  SpO2: 96% 94% 94% 95%    Intake/Output Summary (Last 24 hours) at 01/25/2023 1854 Last data filed at 01/25/2023 1800 Gross per 24 hour  Intake 66.74 ml  Output 1100 ml  Net -1033.26 ml   Physical Exam  Gen:-Less sleepy,, no acute distress HEENT:- Melinda Gomez.AT, No sclera icterus Neck-Supple Neck,No JVD,.  Lungs-  CTAB , fair symmetrical air movement CV- S1, S2 normal, regular  Abd-  +ve B.Sounds, Abd Soft, No tenderness,    Extremity/Skin:- No  edema, pedal pulses present  Psych-affect is flat, able to follow instructions/comments Neuro--left-sided hemiparesis noted, facial Droop and dysarthria noted  Data Reviewed: I have personally reviewed following labs and imaging studies  CBC: Recent Labs  Lab 01/23/23 1739 01/23/23 1747  WBC 8.9  --   NEUTROABS 6.2  --   HGB 14.2 13.9  HCT 44.9 41.0  MCV 90.2  --   PLT 198  --    Basic Metabolic Panel: Recent Labs  Lab 01/23/23 1739 01/23/23 1747  NA 136 137  K 4.1 4.1  CL 110 112*  CO2 16*  --   GLUCOSE 142* 135*  BUN 16 17  CREATININE 1.21* 1.20*  CALCIUM 9.1  --    Liver Function Tests: Recent Labs  Lab 01/23/23 1739  AST 20  ALT 23  ALKPHOS 59  BILITOT 0.3  PROT 7.2  ALBUMIN 4.0   HbA1C: Recent Labs    01/23/23 1739  HGBA1C 6.4*   Radiology Studies: ECHOCARDIOGRAM COMPLETE  Result Date: 01/24/2023    ECHOCARDIOGRAM REPORT   Patient Name:   Melinda Gomez Date of Exam: 01/24/2023 Medical Rec #:  [id number]       Height:       62.0 in Accession #:    [id number]       Weight:       157.2 lb Date of Birth:  1946-01-21        BSA:          1.726 m Patient Age:    77 years        BP:           143/64 mmHg Patient Gender: F               HR:           77 bpm. [facility] Procedure: 2D Echo, Cardiac Doppler and Color Doppler Indications:    Stroke I63.9  History:        Patient has prior history of Echocardiogram examinations, most  recent 05/14/2022. Risk Factors:Hypertension, Diabetes,                 Dyslipidemia and Former Smoker.  Sonographer:    Celesta Gentile RCS Referring Phys: [id number] ASIA B ZIERLE-GHOSH  Sonographer Comments: Technically difficult study due to poor echo windows. IMPRESSIONS  1. Left ventricular ejection fraction, by estimation, is 55 to 60%. The left ventricle has normal function. The left ventricle has no regional wall motion abnormalities. Left ventricular diastolic parameters are consistent with Grade I diastolic dysfunction (impaired relaxation). Elevated left ventricular end-diastolic pressure.  2. Right ventricular systolic function is low normal. The right ventricular size is normal. Moderately increased right ventricular wall thickness.  3. The mitral valve is normal in structure. No evidence of mitral valve regurgitation. No evidence of mitral stenosis.  4. The aortic valve is tricuspid. Aortic valve regurgitation is not visualized. No aortic stenosis is present.  5. The inferior vena cava is normal in size but collapsibility could not be evaluated. Comparison(s): No significant change from prior study. Conclusion(s)/Recommendation(s): No left ventricular mural or apical thrombus/thrombi. FINDINGS  Left Ventricle: Left ventricular ejection fraction, by estimation, is 55 to 60%. The left ventricle has normal function. The left ventricle has no regional wall motion abnormalities. Definity contrast agent was given IV to delineate the left ventricular  endocardial borders. The left ventricular internal cavity size was normal  in size. There is no left ventricular hypertrophy. Abnormal (paradoxical) septal motion, consistent with left bundle branch block. Left ventricular diastolic parameters are consistent with Grade I diastolic dysfunction (impaired relaxation). Elevated left ventricular end-diastolic pressure. Right Ventricle: The right ventricular size is normal. Moderately increased right ventricular wall thickness. Right ventricular systolic function is low normal. Left Atrium: Left atrial size was normal in size. Right Atrium: Right atrial size was normal in size. Pericardium: There is no evidence of pericardial effusion. Mitral Valve: The mitral valve is normal in structure. No evidence of mitral valve regurgitation. No evidence of mitral valve stenosis. Tricuspid Valve: The tricuspid valve is not well visualized. Tricuspid valve regurgitation is not demonstrated. No evidence of tricuspid stenosis. Aortic Valve: The aortic valve is tricuspid. Aortic valve regurgitation is not visualized. No aortic stenosis is present. Pulmonic Valve: The pulmonic valve was not well visualized. Pulmonic valve regurgitation is not visualized. No evidence of pulmonic stenosis. Aorta: The aortic root is normal in size and structure. Venous: The inferior vena cava is normal in size but collapsibility could not be evaluated. IAS/Shunts: The interatrial septum was not well visualized.  LEFT VENTRICLE PLAX 2D LVIDd:         4.40 cm   Diastology LVIDs:         2.40 cm   LV e' medial:    4.79 cm/s LV PW:         0.90 cm   LV E/e' medial:  15.6 LV IVS:        0.90 cm   LV e' lateral:   5.33 cm/s LVOT diam:     2.00 cm   LV E/e' lateral: 14.0 LV SV:         63 LV SV Index:   37 LVOT Area:     3.14 cm  RIGHT VENTRICLE RV S prime:     8.49 cm/s TAPSE (M-mode): 1.5 cm LEFT ATRIUM             Index        RIGHT ATRIUM  Index LA diam:        2.90 cm 1.68 cm/m   RA Area:     11.10 cm LA Vol (A2C):   40.9 ml 23.70 ml/m  RA Volume:   22.30 ml  12.92  ml/m LA Vol (A4C):   40.8 ml 23.64 ml/m LA Biplane Vol: 40.9 ml 23.70 ml/m  AORTIC VALVE LVOT Vmax:   102.00 cm/s LVOT Vmean:  67.000 cm/s LVOT VTI:    0.202 m  AORTA Ao Root diam: 3.00 cm MITRAL VALVE MV Area (PHT): 2.91 cm     SHUNTS MV Decel Time: 261 msec     Systemic VTI:  0.20 m MV E velocity: 74.70 cm/s   Systemic Diam: 2.00 cm MV A velocity: 101.00 cm/s MV E/A ratio:  0.74 Vishnu Priya Mallipeddi Electronically signed by Winfield Rast Mallipeddi Signature Date/Time: 01/24/2023/1:18:03 PM    Final    MR BRAIN WO CONTRAST  Result Date: 01/24/2023 CLINICAL DATA:  Follow-up examination for stroke. EXAM: MRI HEAD WITHOUT CONTRAST TECHNIQUE: Multiplanar, multiecho pulse sequences of the brain and surrounding structures were obtained without intravenous contrast. COMPARISON:  Prior CTs from earlier the same day. FINDINGS: Brain: Mild age-related cerebral atrophy. Patchy and confluent T2/FLAIR hyperintensity involving the periventricular and deep white matter both cerebral hemispheres, distant with chronic small vessel ischemic disease, moderate to advanced in nature. Mild patchy involvement of the pons noted. Large confluent area of restricted diffusion involving the right temporal occipital region, consistent with an evolving acute right MCA distribution infarct (series 5, image 23). No associated hemorrhage. Early gyral swelling and edema without significant regional mass effect at this time. No other evidence for acute or subacute ischemia elsewhere. No acute or chronic intracranial blood products. No mass lesion, midline shift or mass effect. No hydrocephalus or extra-axial fluid collection. Pituitary gland and suprasellar region within normal limits. Vascular: Loss of normal flow void within the right ICA to the terminus, consistent with previously identified occlusion. Irregular and attenuated flow related signal noted within the right MCA distribution as well. Skull and upper cervical spine:  Craniocervical junction within normal limits. Bone marrow signal intensity normal. No scalp soft tissue abnormality. Sinuses/Orbits: Right gaze preference. Prior bilateral ocular lens replacement. Changes of chronic left sphenoid sinusitis. Paranasal sinuses are otherwise clear. Trace right mastoid effusion, of doubtful significance. Other: None. IMPRESSION: 1. Large evolving acute right MCA distribution infarct involving the right temporoccipital region. No associated hemorrhage or significant regional mass effect at this time. 2. Loss of normal flow void within the right ICA and MCA as above, consistent with previously identified occlusion. Findings better characterized on prior CTA. 3. Underlying age-related cerebral atrophy with moderate to advanced chronic microvascular ischemic disease. Electronically Signed   By: Rise Mu M.D.   On: 01/24/2023 00:27    Scheduled Meds:  aspirin EC  81 mg Oral Daily   fluticasone furoate-vilanterol  1 puff Inhalation Daily   And   umeclidinium bromide  1 puff Inhalation Daily   heparin  5,000 Units Subcutaneous Q8H   risperiDONE  1 mg Oral QHS   rosuvastatin  20 mg Oral Daily   venlafaxine XR  75 mg Oral Q breakfast   Continuous Infusions:  sodium chloride 83 mL/hr at 01/25/23 1648    LOS: 1 day   Shon Hale M.D on 01/25/2023 at 6:54 PM  Go to [email or web address] - for contact info  [facility] - Office  [phone]  If 7PM-7AM, please contact night-coverage [email or web address] 01/25/2023, 6:54 PM

## 2023-01-25 NOTE — Plan of Care (Signed)
  Problem: Ischemic Stroke/TIA Tissue Perfusion: Goal: Complications of ischemic stroke/TIA will be minimized Outcome: Progressing   Problem: Clinical Measurements: Goal: Will remain free from infection Outcome: Progressing Goal: Respiratory complications will improve Outcome: Progressing    Patient lethargic and confused.  Unable to verbalize understanding of medical condition or education.  Patient NPO due to lethargy and unable to pass bedside swallow at this time.    Problem: Education: Goal: Knowledge of disease or condition will improve Outcome: Not Progressing Goal: Knowledge of secondary prevention will improve (MUST DOCUMENT ALL) Outcome: Not Progressing Goal: Knowledge of patient specific risk factors will improve Loraine Leriche N/A or DELETE if not current risk factor) Outcome: Not Progressing   Problem: Nutrition: Goal: Risk of aspiration will decrease Outcome: Not Progressing Goal: Dietary intake will improve Outcome: Not Progressing

## 2023-01-25 NOTE — Progress Notes (Addendum)
Inpatient Rehab Admissions:  Inpatient Rehab Consult received.  I spoke with pt's daughter Efraim Kaufmann, per NSG suggestion, on the telephone for rehabilitation assessment and to discuss goals and expectations of an inpatient rehab admission.  Discussed average length of stay and discharge home after completion of CIR. She acknowledged  understanding. She is going to discuss rehab options with pt's husband and her sister prior to making a decision regarding CIR. Will continue to follow.  1542: Pt's daughter Efraim Kaufmann called [facility] to inform her that the family has decided to pursue CIR. Discussed insurance authorization requirement. She acknowledged understanding. She confirmed that pt's husband will be able to provide Min A physically and 24/7 support for pt after discharge. Will continue to follow.     Signed: Wolfgang Phoenix, MS, CCC-SLP Admissions Coordinator [phone]

## 2023-01-25 NOTE — PMR Pre-admission (Shared)
PMR Admission Coordinator Pre-Admission Assessment  Patient: Melinda Gomez is an 77 y.o., female MRN: [id number] DOB: 1945-12-03 Height:   Weight:    Insurance Information HMO: ***    PPO: ***     PCP:      IPA:      80/20:      OTHER:  PRIMARY: [employer]      Policy#: [id number]      Subscriber: patient CM Name: ***      Phone#: ***     Fax#: *** Pre-Cert#: ***      Employer: *** Benefits:  Phone #: ***     Name: *** Dolores Hoose. Date: ***     Deduct: ***      Out of Pocket Max: ***      Life Max: *** CIR: ***      SNF: *** Outpatient: ***     Co-Pay: *** Home Health: ***      Co-Pay: *** DME: ***     Co-Pay: *** Providers: in-network SECONDARY:       Policy#:      Phone#:   Financial Counselor:       Phone#:   The Data processing manager" for patients in Inpatient Rehabilitation Facilities with attached "Privacy Act Statement-Health Care Records" was provided and verbally reviewed with: {CHL IP Patient Family IO:[id number]}  Emergency Contact Information Contact Information     Name Relation Home Work Mobile   [place] [phone]  [phone]   CORNELIOUS,[name] Daughter   [phone]   RORER,TINA Daughter   [phone]      Other Contacts   None on File     Current Medical History  Patient Admitting Diagnosis: CVA History of Present Illness: Pt is a 77 year old female with medical hx significant for: CHF, COPD, CAD, HTN, hyperlipidemia.  Pt presented to [facility] on 01/23/23 d/t sudden onset of left-sided weakness. Imaging showed dense MCA on right. CTA head and neck showed occluded right ICA and right MCA along with possible clot extension into right  P-comm with fetal PCA. Pt not a candidate for TNK or thrombectomy. Therapy evaluations completed and CIR recommended d/t pt's deficits in functional mobility and dysphagia. Complete NIHSS TOTAL: 17  Patient's medical [facility] from [facility] has been reviewed by the  rehabilitation admission coordinator and physician.  Past Medical History  Past Medical History:  Diagnosis Date   CHF (congestive heart failure) (HCC)    COPD (chronic obstructive pulmonary disease) (HCC)    Coronary artery disease    GAD (generalized anxiety disorder)    GERD (gastroesophageal reflux disease)    Hypercholesteremia    Hypertension     Has the patient had major surgery during 100 days prior to admission? No  Family History   family history includes Stroke in her mother.  Current Medications  Current Facility-Administered Medications:    0.9 %  sodium chloride infusion, , Intravenous, Continuous, Emokpae, Courage, MD, Last Rate: 83 mL/hr at 01/25/23 1648, Infusion Verify at 01/25/23 1648   acetaminophen (TYLENOL) tablet 650 mg, 650 mg, Oral, Q4H PRN **OR** acetaminophen (TYLENOL) 160 MG/5ML solution 650 mg, 650 mg, Per Tube, Q4H PRN **OR** acetaminophen (TYLENOL) suppository 650 mg, 650 mg, Rectal, Q4H PRN, Zierle-Ghosh, Asia B, DO   albuterol (VENTOLIN HFA) 108 (90 Base) MCG/ACT inhaler 2 puff, 2 puff, Inhalation, Q4H PRN, Zierle-Ghosh, Asia B, DO   ALPRAZolam (XANAX) tablet 0.5 mg, 0.5 mg, Oral, TID PRN, Zierle-Ghosh, Asia B, DO  aspirin EC tablet 81 mg, 81 mg, Oral, Daily, Zierle-Ghosh, Asia B, DO   cholestyramine (QUESTRAN) packet 4 g, 4 g, Oral, Daily PRN, Zierle-Ghosh, Asia B, DO   fentaNYL (SUBLIMAZE) injection 25 mcg, 25 mcg, Intravenous, Q4H PRN, Mariea Clonts, Courage, MD, 25 mcg at 01/24/23 0842   fluticasone furoate-vilanterol (BREO ELLIPTA) 100-25 MCG/ACT 1 puff, 1 puff, Inhalation, Daily, 1 puff at 01/24/23 0926 **AND** umeclidinium bromide (INCRUSE ELLIPTA) 62.5 MCG/ACT 1 puff, 1 puff, Inhalation, Daily, Zierle-Ghosh, Asia B, DO, 1 puff at 01/24/23 0926   heparin injection 5,000 Units, 5,000 Units, Subcutaneous, Q8H, Zierle-Ghosh, Asia B, DO, 5,000 Units at 01/25/23 1332   risperiDONE (RISPERDAL) tablet 1 mg, 1 mg, Oral, QHS, Zierle-Ghosh, Asia B, DO    rosuvastatin (CRESTOR) tablet 20 mg, 20 mg, Oral, Daily, Zierle-Ghosh, Asia B, DO   senna-docusate (Senokot-S) tablet 1 tablet, 1 tablet, Oral, QHS PRN, Zierle-Ghosh, Asia B, DO   venlafaxine XR (EFFEXOR-XR) 24 hr capsule 75 mg, 75 mg, Oral, Q breakfast, Zierle-Ghosh, Asia B, DO  Patients Current Diet:  Diet Order             Diet NPO time specified  Diet effective now                   Precautions / Restrictions Precautions Precautions: Fall Restrictions Weight Bearing Restrictions: No   Has the patient had 2 or more falls or a fall with injury in the past year? No  Prior Activity Level Limited Community (1-2x/wk): leaves house for doctor's appointments  Prior Functional Level Self Care: Did the patient need help bathing, dressing, using the toilet or eating? Needed some help  Indoor Mobility: Did the patient need assistance with walking from room to room (with or without device)? Independent  Stairs: Did the patient need assistance with internal or external stairs (with or without device)? Daughter reports pt tries to avoid steps  Functional Cognition: Did the patient need help planning regular tasks such as shopping or remembering to take medications? Independent  Patient Information Are you of Hispanic, Latino/a,or Spanish origin?: X. Patient unable to respond, A. No, not of Hispanic, Latino/a, or Spanish origin What is your race?: X. Patient unable to respond, A. White Do you need or want an interpreter to communicate with a doctor or health care staff?: 9. Unable to respond  Patient's Response To:  Health Literacy and Transportation Is the patient able to respond to health literacy and transportation needs?: No Health Literacy - How often do you need to have someone help you when you read instructions, pamphlets, or other written material from your doctor or pharmacy?: Patient unable to respond (daughter reports pt pt did not need help reading medical material) In  the past 12 months, has lack of transportation kept you from medical appointments or from getting medications?:  (daughter reports pt has not missed a medical appointment due to transportation) In the past 12 months, has lack of transportation kept you from meetings, work, or from getting things needed for daily living?:  (daughter reports pt has not missed a non-medical appointment due to transportation)  Journalist, newspaper / Equipment Home Equipment: Rollator (4 wheels), Agricultural consultant (2 wheels), BSC/3in1, Grab bars - tub/shower, Grab bars - toilet, Wheelchair - manual, Tub bench  Prior Device Use: Indicate devices/aids used by the patient prior to current illness, exacerbation or injury? Walker and rollator  Current Functional Level Cognition  Overall Cognitive Status: Impaired/Different from baseline Current Attention Level: Focused, Sustained Orientation Level:  Oriented to person, Disoriented to place, Disoriented to time, Disoriented to situation General Comments: very lethargic, became more alert upon sitting up at bedside    Extremity Assessment (includes Sensation/Coordination)  Upper Extremity Assessment: Defer to OT evaluation RUE Deficits / Details: 3-/5 for shoulder flexion; Generally weak otherwise. RUE Sensation: WNL LUE Deficits / Details: Flaccid; no active movement. WFL P/ROM. No tone. LUE Sensation: WNL LUE Coordination: decreased fine motor, decreased gross motor  Lower Extremity Assessment: Generalized weakness, LLE deficits/detail LLE Deficits / Details: grossly -3/5 LLE Sensation: decreased light touch, decreased proprioception LLE Coordination: decreased fine motor, decreased gross motor    ADLs  Overall ADL's : Needs assistance/impaired Eating/Feeding: Moderate assistance, Bed level Grooming: Moderate assistance, Maximal assistance, Bed level Upper Body Bathing: Moderate assistance, Maximal assistance, Bed level Lower Body Bathing: Maximal assistance,  Total assistance, Bed level Upper Body Dressing : Moderate assistance, Maximal assistance, Bed level Lower Body Dressing: Maximal assistance, Total assistance, Bed level Toilet Transfer: Maximal assistance, Stand-pivot, Rolling walker (2 wheels) Toilet Transfer Details (indicate cue type and reason): Simulated via brief sit to stand and attempted steps laterally. Toileting- Clothing Manipulation and Hygiene: Maximal assistance, Total assistance, Bed level Tub/ Shower Transfer: Maximal assistance, Stand-pivot Functional mobility during ADLs: Maximal assistance, Total assistance, +2 for physical assistance    Mobility  Overal bed mobility: Needs Assistance Bed Mobility: Supine to Sit, Sit to Supine Supine to sit: Max assist Sit to supine: Max assist General bed mobility comments: Much assist; little active assist for mobility from pt.    Transfers  Overall transfer level: Needs assistance Equipment used: Rolling walker (2 wheels) Transfers: Sit to/from Stand, Bed to chair/wheelchair/BSC Sit to Stand: Max assist General transfer comment: very unsteady on feet with with diffiuclty holding onto RW using LUE    Ambulation / Gait / Stairs / Wheelchair Mobility  Ambulation/Gait Ambulation/Gait assistance: Max Chemical engineer (Feet): 3 Feet Assistive device: Rolling walker (2 wheels) Gait Pattern/deviations: Decreased step length - right, Decreased step length - left, Decreased stride length, Shuffle General Gait Details: limited to a few slow labored side steps with shuffling fo LLE due to unable to lift off floor Gait velocity: slow    Posture / Balance Dynamic Sitting Balance Sitting balance - Comments: seated at EOB Balance Overall balance assessment: Needs assistance Sitting-balance support: Feet supported, No upper extremity supported Sitting balance-Leahy Scale: Poor Sitting balance - Comments: seated at EOB Postural control: Posterior lean, Left lateral lean Standing  balance support: Single extremity supported, During functional activity, Reliant on assistive device for balance Standing balance-Leahy Scale: Poor Standing balance comment: using RW    Special needs/care consideration Continuous Drip IV  0.9% sodium chloride infusion, Bladder incontinence, External Urinary Catheter and Skin Erythema/Redness: groin, buttocks/bilateral   Previous Home Environment (from acute therapy documentation) Living Arrangements: Spouse/significant other  Lives With: Spouse Available Help at Discharge: Family, Available 24 hours/day Type of Home: House Home Layout: One level Home Access: Stairs to enter Entrance Stairs-Rails: Right, Left Entrance Stairs-Number of Steps: 1 Bathroom Shower/Tub: Health visitor: Handicapped height Bathroom Accessibility: Yes How Accessible: Accessible via wheelchair, Accessible via walker Home Care Services: No  Discharge Living Setting Plans for Discharge Living Setting: Patient's home Type of Home at Discharge: House Discharge Home Layout: One level Discharge Home Access: Stairs to enter Entrance Stairs-Rails: None Entrance Stairs-Number of Steps: 1 Discharge Bathroom Shower/Tub: Walk-in shower Discharge Bathroom Toilet: Handicapped height Discharge Bathroom Accessibility: Yes How Accessible: Accessible via walker Does the patient have  any problems obtaining your medications?: No  Social/Family/Support Systems Anticipated Caregiver: Yamili Lichtenwalner (husband), [name] Cornelious (daughter) Anticipated Caregiver's Contact Information: Windy Fast: [phone]; Efraim Kaufmann: [phone] Ability/Limitations of Caregiver: Min A Caregiver Availability: 24/7 Discharge Plan Discussed with Primary Caregiver: Yes Is Caregiver In Agreement with Plan?: Yes Does Caregiver/Family have Issues with Lodging/Transportation while Pt is in Rehab?: No  Goals Patient/Family Goal for Rehab: *** Expected length of stay: *** Pt/Family  Agrees to Admission and willing to participate: Yes Program Orientation Provided & Reviewed with Pt/Caregiver Including Roles  & Responsibilities: Yes  Decrease burden of Care through IP rehab admission: NA  Possible need for SNF placement upon discharge: Not anticipated  Patient Condition: I have reviewed medical records from [facility], spoken with {CHL IP CSW MV:[id number]}, and daughter. I discussed via phone for inpatient rehabilitation assessment.  Patient will benefit from ongoing PT, OT, and SLP, can actively participate in 3 hours of therapy a day 5 days of the week, and can make measurable gains during the admission.  Patient will also benefit from the coordinated team approach during an Inpatient Acute Rehabilitation admission.  The patient will receive intensive therapy as well as Rehabilitation physician, nursing, social worker, and care management interventions.  Due to bladder management, safety, skin/wound care, disease management, medication administration, pain management, and patient education the patient requires 24 hour a day rehabilitation nursing.  The patient is currently *** with mobility and basic ADLs.  Discharge setting and therapy post discharge at home with home health is anticipated.  Patient has agreed to participate in the Acute Inpatient Rehabilitation Program and will admit {Time; today/tomorrow:10263}.  Preadmission Screen Completed By:  Domingo Pulse, 01/25/2023 5:14 PM ______________________________________________________________________   Discussed status with Dr. Marland Kitchen on *** at *** and received approval for admission today.  Admission Coordinator:  Domingo Pulse, CCC-SLP, time ***/Date ***   Assessment/Plan: Diagnosis: Does the need for close, 24 hr/day Medical supervision in concert with the patient's rehab needs make it unreasonable for this patient to be served in a less intensive setting?  {yes_no_potentially:3041433} Co-Morbidities requiring supervision/potential complications: *** Due to {due MW:4132440}, does the patient require 24 hr/day rehab nursing? {yes_no_potentially:3041433} Does the patient require coordinated care of a physician, rehab nurse, PT, OT, and SLP to address physical and functional deficits in the context of the above medical diagnosis(es)? {yes_no_potentially:3041433} Addressing deficits in the following areas: {deficits:3041436} Can the patient actively participate in an intensive therapy program of at least 3 hrs of therapy 5 days a week? {yes_no_potentially:3041433} The potential for patient to make measurable gains while on inpatient rehab is {potential:3041437} Anticipated functional outcomes upon discharge from inpatient rehab: {functional outcomes:304600100} PT, {functional outcomes:304600100} OT, {functional outcomes:304600100} SLP Estimated rehab length of stay to reach the above functional goals is: *** Anticipated discharge destination: {anticipated dc setting:21604} 10. Overall Rehab/Functional Prognosis: {potential:3041437}   MD Signature: ***

## 2023-01-26 ENCOUNTER — Inpatient Hospital Stay (HOSPITAL_COMMUNITY): Payer: PPO

## 2023-01-26 DIAGNOSIS — I10 Essential (primary) hypertension: Secondary | ICD-10-CM | POA: Diagnosis not present

## 2023-01-26 DIAGNOSIS — J410 Simple chronic bronchitis: Secondary | ICD-10-CM

## 2023-01-26 DIAGNOSIS — I5022 Chronic systolic (congestive) heart failure: Secondary | ICD-10-CM | POA: Diagnosis not present

## 2023-01-26 DIAGNOSIS — I639 Cerebral infarction, unspecified: Secondary | ICD-10-CM | POA: Diagnosis not present

## 2023-01-26 LAB — BASIC METABOLIC PANEL
Anion gap: 12 (ref 5–15)
BUN: 14 mg/dL (ref 8–23)
CO2: 14 mmol/L — ABNORMAL LOW (ref 22–32)
Calcium: 8.7 mg/dL — ABNORMAL LOW (ref 8.9–10.3)
Chloride: 106 mmol/L (ref 98–111)
Creatinine, Ser: 0.94 mg/dL (ref 0.44–1.00)
GFR, Estimated: >60 mL/min (ref >60)
Glucose, Bld: 124 mg/dL — ABNORMAL HIGH (ref 70–99)
Potassium: 3.6 mmol/L (ref 3.5–5.1)
Sodium: 132 mmol/L — ABNORMAL LOW (ref 135–145)

## 2023-01-26 LAB — CBC
HCT: 42.1 % (ref 36.0–46.0)
Hemoglobin: 14.8 g/dL (ref 12.0–15.0)
MCH: 29.2 pg (ref 26.0–34.0)
MCHC: 35.2 g/dL (ref 30.0–36.0)
MCV: 83.2 fL (ref 80.0–100.0)
Platelets: 231 10*3/uL (ref 150–400)
RBC: 5.06 MIL/uL (ref 3.87–5.11)
RDW: 14 % (ref 11.5–15.5)
WBC: 13.3 10*3/uL — ABNORMAL HIGH (ref 4.0–10.5)
nRBC: 0 % (ref 0.0–0.2)

## 2023-01-26 LAB — URINALYSIS, ROUTINE W REFLEX MICROSCOPIC
Bilirubin Urine: NEGATIVE
Glucose, UA: >=500 mg/dL — AB
Ketones, ur: 20 mg/dL — AB
Leukocytes,Ua: NEGATIVE
Nitrite: POSITIVE — AB
Protein, ur: 30 mg/dL — AB
Specific Gravity, Urine: 1.025 (ref 1.005–1.030)
pH: 5 (ref 5.0–8.0)

## 2023-01-26 LAB — RAPID URINE DRUG SCREEN, HOSP PERFORMED
Amphetamines: NOT DETECTED
Barbiturates: NOT DETECTED
Benzodiazepines: NOT DETECTED
Cocaine: NOT DETECTED
Opiates: NOT DETECTED
Tetrahydrocannabinol: NOT DETECTED

## 2023-01-26 LAB — GLUCOSE, CAPILLARY
Glucose-Capillary: 102 mg/dL — ABNORMAL HIGH (ref 70–99)
Glucose-Capillary: 115 mg/dL — ABNORMAL HIGH (ref 70–99)
Glucose-Capillary: 136 mg/dL — ABNORMAL HIGH (ref 70–99)

## 2023-01-26 MED ORDER — CLOPIDOGREL BISULFATE 75 MG PO TABS
75.0000 mg | ORAL_TABLET | Freq: Every day | ORAL | Status: DC
  Administered 2023-01-26 – 2023-01-31 (×6): 75 mg via ORAL
  Filled 2023-01-26 (×6): qty 1

## 2023-01-26 MED ORDER — METOPROLOL TARTRATE 5 MG/5ML IV SOLN
5.0000 mg | Freq: Four times a day (QID) | INTRAVENOUS | Status: DC
  Administered 2023-01-26 – 2023-01-27 (×5): 5 mg via INTRAVENOUS
  Filled 2023-01-26 (×5): qty 5

## 2023-01-26 MED ORDER — ASPIRIN 300 MG RE SUPP
300.0000 mg | Freq: Once | RECTAL | Status: AC
  Administered 2023-01-26: 300 mg via RECTAL
  Filled 2023-01-26: qty 1

## 2023-01-26 NOTE — Progress Notes (Signed)
PROGRESS NOTE  Melinda Gomez, is a 77 y.o. female, DOB - 11/01/1945, ZOX:[id number]  Admit date - 01/23/2023   Admitting Physician Brayla Pat Mariea Clonts, MD  Outpatient Primary MD for the patient is Thana Ates, MD  LOS - 2  Chief Complaint  Patient presents with   Code Stroke       Brief Narrative:  77 y.o. female with medical history significant of CHF, COPD, CAD, GAD, GERD, hyperlipidemia, hypertension admitted on 01/23/2023 with large acute right MCA stroke   -Assessment and Plan: 1)Large Acute Rt MCA CVA  --MRI brain shows Large evolving acute right MCA distribution infarct involving the right temporoccipital region. -CTA head and neck shows right ICA occlusionComplete occlusion of the right ICA just distal to the bifurcation, with reconstitution in the cavernous segment, likely retrograde, with overall poor opacification and possible additional occlusion near the terminus, as well as Occlusion of the right M1, with reconstitution just before the bifurcation, likely via collaterals.   - Neuro was consulted and determined the patient was a poor candidate for TNK and for thrombectomy due to poor functional status -LDL is 16 , HDL is 49----Even if her lipid panel is within desired limits, patient should still take Crestor/Statin for it's Pleiotropic effects (beyond cholesterol lowering benefits) -A1c 6.4 -Echo with EF of 55 to 60% without regional wall motion normalities, but has grade 1 diastolic dysfunction.  No AS and no MS  take Aspirin 81 mg daily along with Plavix 75 mg daily for 90 days then after that STOP the Plavix  and continue ONLY Aspirin 81 mg daily indefinitely--for secondary stroke Prevention (Per The multicenter SAMMPRIS trial) 01/26/23 -Interactive, verbal -Able to do more with the left side -Speech pathology eval appreciated recommends---Dysphagia 2 (Fine chop);Thin liquid   --PT OT eval appreciated recommends CIR/inpatient rehab --c/n Aspirin, plavix and Crestor as  ordered (rectal aspirin until oral intake is reliable)  - 2)Chronic systolic CHF (congestive heart failure) (HCC) - -holding Entresto, metoprolol and spironolactone in the setting of permissive hypertension -Becoming more tachycardic--okay to start IV metoprolol until oral intake is reliable enough to restart oral metoprolol  3)GAD (generalized anxiety disorder) - Continue Xanax prn  4)Hypertension - Holding spironolactone, metoprolol, Entresto for permissive hypertension --Becoming more tachycardic--okay to start IV metoprolol until oral intake is reliable enough to restart oral metoprolol  5)COPD (chronic obstructive pulmonary disease) (HCC) -Stable, no acute exacerbation, continue bronchodilators  6) social/ethics Plan of care and advanced directives discussed with daughter and husband at bedside, patient is a DNR/DNI  7)Fevers--- T max 100.9 -Currently afebrile -Check UA  -Chest x-ray is unchanged from 10/02/2022 with left lower lobe collapse and small effusion - 8)FEN--gentle IV hydration until oral intake resumes  Status is: Inpatient   Disposition: The patient is from: Home              Anticipated d/c is to: CIR              Anticipated d/c date is: 2 days              Patient currently is not medically stable to d/c. Barriers: Not Clinically Stable-   Code Status :  -  Code Status: Limited: Do not attempt resuscitation (DNR) -DNR-LIMITED -Do Not Intubate/DNI    Family Communication:   Discussed with daughter and Husband at bedside  DVT Prophylaxis  :   - SCDs  heparin injection 5,000 Units Start: 01/23/23 2200 SCD's Start: 01/23/23 2157   Lab Results  Component  Value Date   PLT 231 01/26/2023   Inpatient Medications  Scheduled Meds:  aspirin EC  81 mg Oral Daily   aspirin  300 mg Rectal Once   fluticasone furoate-vilanterol  1 puff Inhalation Daily   And   umeclidinium bromide  1 puff Inhalation Daily   heparin  5,000 Units Subcutaneous Q8H   metoprolol  tartrate  5 mg Intravenous Q6H   rosuvastatin  20 mg Oral Daily   venlafaxine XR  75 mg Oral Q breakfast   Continuous Infusions:   PRN Meds:.acetaminophen **OR** acetaminophen (TYLENOL) oral liquid 160 mg/5 mL **OR** acetaminophen, albuterol, ALPRAZolam, cholestyramine, fentaNYL (SUBLIMAZE) injection, senna-docusate   Anti-infectives (From admission, onward)    None       Subjective: [name] today has no emesis,  No chest pain Husband and daughters Efraim Kaufmann and Inetta Fermo)  are at bedside -Swallowing difficulties persist -had fevers Yesterday , no further fevers today -Becoming more tachycardic--okay to start IV metoprolol until oral intake is reliable enough to restart oral metoprolol  Objective: Vitals:   01/26/23 0845 01/26/23 0948 01/26/23 1054 01/26/23 1316  BP:   (!) 148/67 (!) 150/87  Pulse:  (!) 140 (!) 104 97  Resp:   18 16  Temp:   98.6 F (37 C) 99 F (37.2 C)  TempSrc:   Oral Oral  SpO2: 96%  98% 97%    Intake/Output Summary (Last 24 hours) at 01/26/2023 1612 Last data filed at 01/26/2023 1500 Gross per 24 hour  Intake 848.24 ml  Output 1000 ml  Net -151.76 ml   Physical Exam  Gen:-Less sleepy,, no acute distress HEENT:- [place].AT, No sclera icterus Neck-Supple Neck,No JVD,.  Lungs-  CTAB , fair symmetrical air movement CV- S1, S2 normal, regular  Abd-  +ve B.Sounds, Abd Soft, No tenderness,    Extremity/Skin:- No  edema, pedal pulses present  Psych-affect is flat, able to follow instructions/comments Neuro--left-sided hemiparesis noted, facial Droop and dysarthria noted  Data Reviewed: I have personally reviewed following labs and imaging studies  CBC: Recent Labs  Lab 01/23/23 1739 01/23/23 1747 01/26/23 0505  WBC 8.9  --  13.3*  NEUTROABS 6.2  --   --   HGB 14.2 13.9 14.8  HCT 44.9 41.0 42.1  MCV 90.2  --  83.2  PLT 198  --  231   Basic Metabolic Panel: Recent Labs  Lab 01/23/23 1739 01/23/23 1747 01/26/23 0505  NA 136 137 132*  K  4.1 4.1 3.6  CL 110 112* 106  CO2 16*  --  14*  GLUCOSE 142* 135* 124*  BUN 16 17 14   CREATININE 1.21* 1.20* 0.94  CALCIUM 9.1  --  8.7*   Liver Function Tests: Recent Labs  Lab 01/23/23 1739  AST 20  ALT 23  ALKPHOS 59  BILITOT 0.3  PROT 7.2  ALBUMIN 4.0   HbA1C: Recent Labs    01/23/23 1739  HGBA1C 6.4*   Radiology Studies: DG CHEST PORT 1 VIEW  Result Date: 01/26/2023 CLINICAL DATA:  Fever EXAM: PORTABLE CHEST 1 VIEW COMPARISON:  10/02/2022 FINDINGS: Similar appearance of retrocardiac left base collapse/consolidation with small effusion. Right lung clear. Interstitial markings are diffusely coarsened with chronic features. Cardiopericardial silhouette is at upper limits of normal for size. Bones are diffusely demineralized. Telemetry leads overlie the chest. IMPRESSION: Similar appearance of retrocardiac left base collapse/consolidation with small effusion. Electronically Signed   By: Kennith Center M.D.   On: 01/26/2023 09:00    Scheduled Meds:  aspirin  EC  81 mg Oral Daily   aspirin  300 mg Rectal Once   fluticasone furoate-vilanterol  1 puff Inhalation Daily   And   umeclidinium bromide  1 puff Inhalation Daily   heparin  5,000 Units Subcutaneous Q8H   metoprolol tartrate  5 mg Intravenous Q6H   rosuvastatin  20 mg Oral Daily   venlafaxine XR  75 mg Oral Q breakfast   Continuous Infusions:   LOS: 2 days   Shon Hale M.D on 01/26/2023 at 4:12 PM  Go to [email or web address] - for contact info  [facility] - Office  [phone]  If 7PM-7AM, please contact night-coverage [email or web address] 01/26/2023, 4:12 PM

## 2023-01-26 NOTE — Progress Notes (Signed)
Physical Therapy Treatment Patient Details Name: Melinda Gomez MRN: [id number] DOB: 03-21-1946 Today's Date: 01/26/2023   History of Present Illness Melinda Gomez is a 77 y.o. female with medical history significant of CHF, COPD, CAD, GAD, GERD, hyperlipidemia, hypertension, presents to the ED with a chief complaint of difficulty swallowing.  Husband is patient's primary caretaker.  He reports he last saw her mid afternoon when he went out to work in the yard.  When he came back and she was complaining of a headache and asked for her rizatriptan.  It was the first time she had asked for it in 3 days.  He noted something was wrong when she could not pick it up with her hand.  Her left hand was not moving at all and her right hand could not get coordinated enough to pick it up.  He put in her mouth give her some Pepsi and she had a lot of difficulty swallowing it.  He does think she got it down.  After that he noted that her speech was not clear.  She had a left-sided droop as well.  He reports that prior to that she had been doing very well.  She even ambulated on her own to the bathroom and mostly took care of that herself except for redressing after using the toilet.  She has not been complaining of anything to him.  He called EMS who advised him to evaluate her pronator drift.  She could not lift her left arm.  They brought her in for stroke evaluation.  Unfortunately, patient is not able to provide much history as she is quite somnolent after her headache cocktail.    PT Comments  Pt tolerated today's treatment session, however was limited to EOB sitting, balance and education to family due to elevated HR, topped at 140 bpm, HR returned to 90's in supine position. Terminated further OOB mobility due to tachycardia. Did not lower until placed back in supine position. Today's session addressed progressive strengthening and therapeutic activity. Pt showing improved attention to L side with increased LLE  movement when mobilizing to EOB. Mod assist provided this session compared to max assist at evaluation. Showing improve LLE strength. Sitting balance improved as well with reduced posterolateral lean to L side. Able to sit @ EOB with intermittent min assist today. Pt showing increased return for receptive communication with improved consistency following single-step commands. Family members educated on PROM activities at bed level, gravity assisted activities, and staying on pt's left side with awake to improve pt's attention and awareness on Left side. Pt noted with continued functional mobility deficits due L side weakness in setting of recent CVA. Pt would continue to benefit from skilled acute physical therapy services in order to progress toward POC goals, safety/independence with functional mobility and QOL.     If plan is discharge home, recommend the following: A lot of help with bathing/dressing/bathroom;A lot of help with walking and/or transfers;Help with stairs or ramp for entrance;Assistance with cooking/housework   Can travel by private vehicle        Equipment Recommendations  None recommended by PT    Recommendations for Other Services       Precautions / Restrictions Precautions Precautions: Fall Restrictions Weight Bearing Restrictions: No     Mobility  Bed Mobility Overal bed mobility: Needs Assistance Bed Mobility: Supine to Sit     Supine to sit: Mod assist Sit to supine: Mod assist   General bed mobility comments: mod assist  provided with supine to sit. Slow, labored movement but able to follow commands with BLE movement toward EOB and cues given for trunk rolling and mod assist to elevate trunk from elevated HOB. Mod assist for anterior sliding toward EOB with drawpad. Mod assist for return back to supine with controlled trunk movement to [facility] and assist at BLE. Mod assist for superior sliding, pt able to perform hip extension, supine bridge and therapist use draw  sheet to slide superiorly for bed positioning. HR initially @ 90 bpm. Once placed in supine position HR returned back to 80-90s bpm. Patient Response: Flat affect, Cooperative  Transfers                        Ambulation/Gait                   Stairs             Wheelchair Mobility     Tilt Bed Tilt Bed Patient Response: Flat affect, Cooperative  Modified Rankin (Stroke Patients Only)       Balance Overall balance assessment: Needs assistance Sitting-balance support: Feet supported, No upper extremity supported Sitting balance-Leahy Scale: Fair Sitting balance - Comments: fair/poor sitting balance this session. Showed intermittent min assist to maintain sitting balance at EOB. Tolerated 5-8 minutes of sitting EOB. Elevated HR in 130-140 bpms with activity today. Reduced posterior lean compared to evaluation.                                    Cognition Arousal: Lethargic Behavior During Therapy: Flat affect Overall Cognitive Status: Impaired/Different from baseline Area of Impairment: Attention                   Current Attention Level: Focused, Sustained           General Comments: continued with lethargy throughout session        Exercises General Exercises - Lower Extremity Long Arc Quad: AROM, Strengthening, Both, 10 reps Heel Slides: Both, AROM, Strengthening, 10 reps Other Exercises Other Exercises: Pt's daughter and husband educed on atteunation to L side when at bedside. When pt is awake, stay on left side and promote increased visual tracking and attention to L side. Demonstrated and educated to pt's family bed level PROM on R side to perform bilaterally. Educated on ankle dorsiflexion in supine for passive muscle extensibility to reduce contractions. Educated on gravity assisted LLE activities to increase strength and mobility.    General Comments        Pertinent Vitals/Pain Pain Assessment Faces Pain  Scale: No hurt    Home Living                          Prior Function            PT Goals (current goals can now be found in the care plan section) Acute Rehab PT Goals Patient Stated Goal: return home PT Goal Formulation: With patient/family Time For Goal Achievement: 03/07/23 Potential to Achieve Goals: Fair Progress towards PT goals: Progressing toward goals    Frequency    Min 4X/week      PT Plan      Co-evaluation     PT goals addressed during session: Mobility/safety with mobility;Strengthening/ROM        AM-PAC PT "6 Clicks" Mobility   Outcome Measure  Help needed turning from your back to your side while in a flat bed without using bedrails?: A Lot Help needed moving from lying on your back to sitting on the side of a flat bed without using bedrails?: A Lot Help needed moving to and from a bed to a chair (including a wheelchair)?: A Lot Help needed standing up from a chair using your arms (e.g., wheelchair or bedside chair)?: A Lot Help needed to walk in hospital room?: A Lot Help needed climbing 3-5 steps with a railing? : Total 6 Click Score: 11    End of Session   Activity Tolerance: Patient tolerated treatment well;Patient limited by fatigue Patient left: in bed;with call bell/phone within reach;with family/visitor present Nurse Communication: Mobility status       Time: 0912-0938 PT Time Calculation (min) (ACUTE ONLY): 26 min  Charges:    $Therapeutic Activity: 23-37 mins PT General Charges $$ ACUTE PT VISIT: 1 Visit                     Nelida Meuse PT, DPT Physical Therapist with [facility] [phone] office   Nelida Meuse 01/26/2023, 9:57 AM

## 2023-01-26 NOTE — Evaluation (Signed)
Clinical/Bedside Swallow Evaluation Patient Details  Name: Melinda Gomez MRN: [id number] Date of Birth: 1945-07-21  Today's Date: 01/26/2023 Time: SLP Start Time (ACUTE ONLY): 1525 SLP Stop Time (ACUTE ONLY): 1554 SLP Time Calculation (min) (ACUTE ONLY): 29 min  Past Medical History:  Past Medical History:  Diagnosis Date   CHF (congestive heart failure) (HCC)    COPD (chronic obstructive pulmonary disease) (HCC)    Coronary artery disease    GAD (generalized anxiety disorder)    GERD (gastroesophageal reflux disease)    Hypercholesteremia    Hypertension    Past Surgical History:  Past Surgical History:  Procedure Laterality Date   ABDOMINAL HYSTERECTOMY     BLADDER SURGERY     RIGHT/LEFT HEART CATH AND CORONARY ANGIOGRAPHY N/A 05/01/2021   Procedure: RIGHT/LEFT HEART CATH AND CORONARY ANGIOGRAPHY;  Surgeon: [place], Peter M, MD;  Location: [facility]AS[facility];  Service: Cardiovascular;  Laterality: N/A;   HPI:  Melinda Gomez is a 77 y.o. female with medical history significant of CHF, COPD, CAD, GAD, GERD, hyperlipidemia, hypertension, presents to the ED with a chief complaint of difficulty swallowing.  Husband is patient's primary caretaker.  He reports he last saw her mid afternoon when he went out to work in the yard.  When he came back and she was complaining of a headache and asked for her rizatriptan.  It was the first time she had asked for it in 3 days.  He noted something was wrong when she could not pick it up with her hand.  Her left hand was not moving at all and her right hand could not get coordinated enough to pick it up.  He put in her mouth give her some Pepsi and she had a lot of difficulty swallowing it.  He does think she got it down.  After that he noted that her speech was not clear.  She had a left-sided droop as well.  He reports that prior to that she had been doing very well.  She even ambulated on her own to the bathroom and mostly took care of that herself  except for redressing after using the toilet.  She has not been complaining of anything to him.  He called EMS who advised him to evaluate her pronator drift.  She could not lift her left arm.  They brought her in for stroke evaluation.  Unfortunately, patient is not able to provide much history as she is quite somnolent after her headache cocktail. Pt failed the [facility] stroke swallow screen on 11/1 and was not alert later that day or the following day for BSE. BSE requested and Pt is alert to participate today    Assessment / Plan / Recommendation  Clinical Impression  Clinical swallowing evaluation completed while Pt was sitting upright in bed with husband and two daughters at bedside. Pt was alert and maintained alertness with eyes open through evaluation. Pt consumed thin liquids and puree textures without overt s/sx of oropharyngeal dysphagia. Pt did demonstrate belching throughout trials. Pt is edentulous and consumes a very soft diet at home. Recommend initiate D2/fine chop diet and thin liquid diet. Recommend meds be administered crushed in puree. ST will f/u X1 to ensure diet tolerance and for possible upgrade. Note after my evaluation Pt fell back asleep and family was educated that she should only be provided PO when alert and responsive. Thank you for this referral, SLP Visit Diagnosis: Dysphagia, unspecified (R13.10)    Aspiration Risk  Mild aspiration  risk    Diet Recommendation Dysphagia 2 (Fine chop);Thin liquid    Liquid Administration via: Cup;Straw Medication Administration: Crushed with puree Supervision: Patient able to self feed;Staff to assist with self feeding Compensations: Minimize environmental distractions;Slow rate;Small sips/bites Postural Changes: Seated upright at 90 degrees    Other  Recommendations Oral Care Recommendations: Oral care BID    Recommendations for follow up therapy are one component of a multi-disciplinary discharge planning process, led by the  attending physician.  Recommendations may be updated based on patient status, additional functional criteria and insurance authorization.  Follow up Recommendations Skilled nursing-short term rehab (<3 hours/day)      Assistance Recommended at Discharge    Functional Status Assessment Patient has had a recent decline in their functional status and demonstrates the ability to make significant improvements in function in a reasonable and predictable amount of time.  Frequency and Duration min 1 x/week  1 week       Prognosis Prognosis for improved oropharyngeal function: Good      Swallow Study   General Date of Onset: 01/23/23 HPI: Melinda Gomez is a 77 y.o. female with medical history significant of CHF, COPD, CAD, GAD, GERD, hyperlipidemia, hypertension, presents to the ED with a chief complaint of difficulty swallowing.  Husband is patient's primary caretaker.  He reports he last saw her mid afternoon when he went out to work in the yard.  When he came back and she was complaining of a headache and asked for her rizatriptan.  It was the first time she had asked for it in 3 days.  He noted something was wrong when she could not pick it up with her hand.  Her left hand was not moving at all and her right hand could not get coordinated enough to pick it up.  He put in her mouth give her some Pepsi and she had a lot of difficulty swallowing it.  He does think she got it down.  After that he noted that her speech was not clear.  She had a left-sided droop as well.  He reports that prior to that she had been doing very well.  She even ambulated on her own to the bathroom and mostly took care of that herself except for redressing after using the toilet.  She has not been complaining of anything to him.  He called EMS who advised him to evaluate her pronator drift.  She could not lift her left arm.  They brought her in for stroke evaluation.  Unfortunately, patient is not able to provide much history  as she is quite somnolent after her headache cocktail. Pt failed the [facility] stroke swallow screen on 11/1 and was not alert later that day or the following day for BSE. BSE requested and Pt is alert to participate today Type of Study: Bedside Swallow Evaluation Diet Prior to this Study: NPO Temperature Spikes Noted: No Respiratory Status: Room air History of Recent Intubation: No Behavior/Cognition: Alert Oral Cavity Assessment: Within Functional Limits Oral Care Completed by SLP: Yes Oral Cavity - Dentition: Edentulous Vision: Functional for self-feeding Self-Feeding Abilities: Able to feed self Patient Positioning: Upright in bed Baseline Vocal Quality: Normal Volitional Cough: Strong Volitional Swallow: Able to elicit    Oral/Motor/Sensory Function Overall Oral Motor/Sensory Function: Within functional limits   Ice Chips Ice chips: Within functional limits   Thin Liquid Thin Liquid: Within functional limits    Nectar Thick Nectar Thick Liquid: Not tested   Honey Thick Honey  Thick Liquid: Not tested   Puree Puree: Within functional limits   Solid      Melinda Gomez, CCC-SLP Speech Language Pathologist  Solid: Not tested      [name] 01/26/2023,3:57 PM

## 2023-01-27 ENCOUNTER — Telehealth: Payer: Self-pay | Admitting: Cardiovascular Disease

## 2023-01-27 DIAGNOSIS — I639 Cerebral infarction, unspecified: Secondary | ICD-10-CM | POA: Diagnosis not present

## 2023-01-27 DIAGNOSIS — I5022 Chronic systolic (congestive) heart failure: Secondary | ICD-10-CM | POA: Diagnosis not present

## 2023-01-27 DIAGNOSIS — I10 Essential (primary) hypertension: Secondary | ICD-10-CM | POA: Diagnosis not present

## 2023-01-27 LAB — CBC
HCT: 41.4 % (ref 36.0–46.0)
Hemoglobin: 14 g/dL (ref 12.0–15.0)
MCH: 28.3 pg (ref 26.0–34.0)
MCHC: 33.8 g/dL (ref 30.0–36.0)
MCV: 83.6 fL (ref 80.0–100.0)
Platelets: 219 10*3/uL (ref 150–400)
RBC: 4.95 MIL/uL (ref 3.87–5.11)
RDW: 14.2 % (ref 11.5–15.5)
WBC: 12.9 10*3/uL — ABNORMAL HIGH (ref 4.0–10.5)
nRBC: 0 % (ref 0.0–0.2)

## 2023-01-27 LAB — RENAL FUNCTION PANEL
Albumin: 3.3 g/dL — ABNORMAL LOW (ref 3.5–5.0)
Anion gap: 10 (ref 5–15)
BUN: 22 mg/dL (ref 8–23)
CO2: 18 mmol/L — ABNORMAL LOW (ref 22–32)
Calcium: 8.5 mg/dL — ABNORMAL LOW (ref 8.9–10.3)
Chloride: 105 mmol/L (ref 98–111)
Creatinine, Ser: 0.96 mg/dL (ref 0.44–1.00)
GFR, Estimated: >60 mL/min (ref >60)
Glucose, Bld: 131 mg/dL — ABNORMAL HIGH (ref 70–99)
Phosphorus: 1.8 mg/dL — ABNORMAL LOW (ref 2.5–4.6)
Potassium: 3.1 mmol/L — ABNORMAL LOW (ref 3.5–5.1)
Sodium: 133 mmol/L — ABNORMAL LOW (ref 135–145)

## 2023-01-27 LAB — GLUCOSE, CAPILLARY
Glucose-Capillary: 123 mg/dL — ABNORMAL HIGH (ref 70–99)
Glucose-Capillary: 125 mg/dL — ABNORMAL HIGH (ref 70–99)
Glucose-Capillary: 140 mg/dL — ABNORMAL HIGH (ref 70–99)
Glucose-Capillary: 148 mg/dL — ABNORMAL HIGH (ref 70–99)

## 2023-01-27 MED ORDER — POTASSIUM CHLORIDE CRYS ER 20 MEQ PO TBCR
40.0000 meq | EXTENDED_RELEASE_TABLET | ORAL | Status: AC
  Administered 2023-01-27 (×2): 40 meq via ORAL
  Filled 2023-01-27 (×2): qty 2

## 2023-01-27 MED ORDER — OXYCODONE HCL 5 MG PO TABS
5.0000 mg | ORAL_TABLET | Freq: Once | ORAL | Status: AC
  Administered 2023-01-27: 5 mg via ORAL
  Filled 2023-01-27: qty 1

## 2023-01-27 MED ORDER — SUMATRIPTAN SUCCINATE 50 MG PO TABS
50.0000 mg | ORAL_TABLET | Freq: Three times a day (TID) | ORAL | Status: DC | PRN
  Administered 2023-01-27 – 2023-01-29 (×2): 50 mg via ORAL
  Filled 2023-01-27 (×2): qty 1

## 2023-01-27 MED ORDER — METOPROLOL SUCCINATE ER 50 MG PO TB24
50.0000 mg | ORAL_TABLET | Freq: Every day | ORAL | Status: DC
  Administered 2023-01-27 – 2023-01-31 (×5): 50 mg via ORAL
  Filled 2023-01-27 (×5): qty 1

## 2023-01-27 NOTE — Telephone Encounter (Signed)
Daughter Efraim Kaufmann) called to let Dr. Flora Lipps know patient had a stroke and will be going to rehab.

## 2023-01-27 NOTE — Progress Notes (Signed)
Physical Therapy Treatment Patient Details Name: Melinda Gomez MRN: [id number] DOB: 1945-08-02 Today's Date: 01/27/2023   History of Present Illness Melinda Gomez is a 77 y.o. female with medical history significant of CHF, COPD, CAD, GAD, GERD, hyperlipidemia, hypertension, presents to the ED with a chief complaint of difficulty swallowing.  Husband is patient's primary caretaker.  He reports he last saw her mid afternoon when he went out to work in the yard.  When he came back and she was complaining of a headache and asked for her rizatriptan.  It was the first time she had asked for it in 3 days.  He noted something was wrong when she could not pick it up with her hand.  Her left hand was not moving at all and her right hand could not get coordinated enough to pick it up.  He put in her mouth give her some Pepsi and she had a lot of difficulty swallowing it.  He does think she got it down.  After that he noted that her speech was not clear.  She had a left-sided droop as well.  He reports that prior to that she had been doing very well.  She even ambulated on her own to the bathroom and mostly took care of that herself except for redressing after using the toilet.  She has not been complaining of anything to him.  He called EMS who advised him to evaluate her pronator drift.  She could not lift her left arm.  They brought her in for stroke evaluation.  Unfortunately, patient is not able to provide much history as she is quite somnolent after her headache cocktail.    PT Comments  Patient presents with increased left side strength and able to grip RW with left hand during gait training.  Patient tolerated walking in room/hallway followed with w/c for safety, limited due to fatigue requiring frequent sitting rest breaks, fair/good return for completing BLE while seated at EOB requiring verbal cueing and tolerated sitting up in chair after therapy.  Patient will benefit from continued skilled physical  therapy in hospital and recommended venue below to increase strength, balance, endurance for safe ADLs and gait.     If plan is discharge home, recommend the following: A lot of help with bathing/dressing/bathroom;A lot of help with walking and/or transfers;Help with stairs or ramp for entrance;Assistance with cooking/housework   Can travel by private vehicle        Equipment Recommendations  None recommended by PT    Recommendations for Other Services       Precautions / Restrictions Precautions Precautions: Fall Restrictions Weight Bearing Restrictions: No     Mobility  Bed Mobility Overal bed mobility: Needs Assistance Bed Mobility: Supine to Sit     Supine to sit: Mod assist     General bed mobility comments: increased time, improvement for using LUE to assist with supine to sitting, scooting to EOB    Transfers Overall transfer level: Needs assistance Equipment used: Rolling walker (2 wheels) Transfers: Sit to/from Stand, Bed to chair/wheelchair/BSC Sit to Stand: Min assist, Mod assist   Step pivot transfers: Mod assist       General transfer comment: able to grip RW with left hand, easily fatigued    Ambulation/Gait Ambulation/Gait assistance: Mod assist Gait Distance (Feet): 20 Feet Assistive device: Rolling walker (2 wheels) Gait Pattern/deviations: Decreased step length - right, Decreased step length - left, Decreased stride length, Trunk flexed Gait velocity: slow  General Gait Details: increased endurance/distance for taking steps followed with w/c for safety and limited mostly due to c/o fatigue   Stairs             Wheelchair Mobility     Tilt Bed    Modified Rankin (Stroke Patients Only)       Balance Overall balance assessment: Needs assistance Sitting-balance support: Feet supported, No upper extremity supported Sitting balance-Leahy Scale: Fair Sitting balance - Comments: fair/good with occasional lateral leaning to the  right, able maintain trunk in midline after verbal/tactile cueing Postural control: Right lateral lean Standing balance support: Reliant on assistive device for balance, During functional activity, Bilateral upper extremity supported Standing balance-Leahy Scale: Poor Standing balance comment: fair/poor using RW                            Cognition Arousal: Alert Behavior During Therapy: Flat affect Overall Cognitive Status: Within Functional Limits for tasks assessed                                          Exercises General Exercises - Lower Extremity Long Arc Quad: Seated, AROM, Strengthening, Both, 10 reps Hip Flexion/Marching: Seated, AROM, Strengthening, Both, 10 reps Toe Raises: Seated, AROM, Strengthening, Both, 10 reps Heel Raises: Seated, AROM, Strengthening, Both, 10 reps    General Comments        Pertinent Vitals/Pain Pain Assessment Pain Assessment: No/denies pain    Home Living                          Prior Function            PT Goals (current goals can now be found in the care plan section) Acute Rehab PT Goals Patient Stated Goal: return home PT Goal Formulation: With patient/family Time For Goal Achievement: 03/07/23 Potential to Achieve Goals: Good Progress towards PT goals: Progressing toward goals    Frequency    Min 4X/week      PT Plan      Co-evaluation PT/OT/SLP Co-Evaluation/Treatment: Yes Reason for Co-Treatment: Complexity of the patient's impairments (multi-system involvement) PT goals addressed during session: Mobility/safety with mobility;Strengthening/ROM;Proper use of DME;Balance        AM-PAC PT "6 Clicks" Mobility   Outcome Measure  Help needed turning from your back to your side while in a flat bed without using bedrails?: A Lot Help needed moving from lying on your back to sitting on the side of a flat bed without using bedrails?: A Lot Help needed moving to and from a bed  to a chair (including a wheelchair)?: A Lot Help needed standing up from a chair using your arms (e.g., wheelchair or bedside chair)?: A Lot Help needed to walk in hospital room?: A Lot Help needed climbing 3-5 steps with a railing? : A Lot 6 Click Score: 12    End of Session Equipment Utilized During Treatment: Gait belt Activity Tolerance: Patient tolerated treatment well;Patient limited by fatigue Patient left: in chair;with call bell/phone within reach;with family/visitor present Nurse Communication: Mobility status PT Visit Diagnosis: Unsteadiness on feet (R26.81);Other abnormalities of gait and mobility (R26.89);Muscle weakness (generalized) (M62.81)     Time: 1610-9604 PT Time Calculation (min) (ACUTE ONLY): 22 min  Charges:    $Gait Training: 8-22 mins $Therapeutic Activity: 8-22 mins PT General  Charges $$ ACUTE PT VISIT: 1 Visit                     2:35 PM, 01/27/23 Ocie Bob, MPT Physical Therapist with [facility] [phone] office [phone] mobile phone

## 2023-01-27 NOTE — Plan of Care (Signed)
Problem: Education: Goal: Knowledge of disease or condition will improve 01/27/2023 0432 by Luetta Nutting, RN Outcome: Progressing 01/26/2023 1943 by Luetta Nutting, RN Outcome: Not Progressing Goal: Knowledge of secondary prevention will improve (MUST DOCUMENT ALL) 01/27/2023 0432 by Luetta Nutting, RN Outcome: Progressing 01/26/2023 1943 by Luetta Nutting, RN Outcome: Not Progressing Goal: Knowledge of patient specific risk factors will improve Loraine Leriche N/A or DELETE if not current risk factor) 01/27/2023 0432 by Luetta Nutting, RN Outcome: Progressing 01/26/2023 1943 by Luetta Nutting, RN Outcome: Not Progressing   Problem: Ischemic Stroke/TIA Tissue Perfusion: Goal: Complications of ischemic stroke/TIA will be minimized 01/27/2023 0432 by Luetta Nutting, RN Outcome: Progressing 01/26/2023 1943 by Luetta Nutting, RN Outcome: Not Progressing   Problem: Coping: Goal: Will verbalize positive feelings about self 01/27/2023 0432 by Luetta Nutting, RN Outcome: Progressing 01/26/2023 1943 by Luetta Nutting, RN Outcome: Not Progressing Goal: Will identify appropriate support needs 01/27/2023 0432 by Luetta Nutting, RN Outcome: Progressing 01/26/2023 1943 by Luetta Nutting, RN Outcome: Not Progressing   Problem: Health Behavior/Discharge Planning: Goal: Ability to manage health-related needs will improve 01/27/2023 0432 by Luetta Nutting, RN Outcome: Progressing 01/26/2023 1943 by Luetta Nutting, RN Outcome: Not Progressing Goal: Goals will be collaboratively established with patient/family 01/27/2023 0432 by Luetta Nutting, RN Outcome: Progressing 01/26/2023 1943 by Luetta Nutting, RN Outcome: Not Progressing   Problem: Self-Care: Goal: Ability to participate in self-care as condition permits will improve 01/27/2023 0432 by Luetta Nutting, RN Outcome: Progressing 01/26/2023 1943 by Luetta Nutting, RN Outcome: Not Progressing Goal:  Verbalization of feelings and concerns over difficulty with self-care will improve 01/27/2023 0432 by Luetta Nutting, RN Outcome: Progressing 01/26/2023 1943 by Luetta Nutting, RN Outcome: Not Progressing Goal: Ability to communicate needs accurately will improve 01/27/2023 0432 by Luetta Nutting, RN Outcome: Progressing 01/26/2023 1943 by Luetta Nutting, RN Outcome: Not Progressing   Problem: Nutrition: Goal: Risk of aspiration will decrease 01/27/2023 0432 by Luetta Nutting, RN Outcome: Progressing 01/26/2023 1943 by Luetta Nutting, RN Outcome: Not Progressing Goal: Dietary intake will improve 01/27/2023 0432 by Luetta Nutting, RN Outcome: Progressing 01/26/2023 1943 by Luetta Nutting, RN Outcome: Not Progressing   Problem: Education: Goal: Knowledge of General Education information will improve Description: Including pain rating scale, medication(s)/side effects and non-pharmacologic comfort measures 01/27/2023 0432 by Luetta Nutting, RN Outcome: Progressing 01/26/2023 1943 by Luetta Nutting, RN Outcome: Not Progressing   Problem: Health Behavior/Discharge Planning: Goal: Ability to manage health-related needs will improve 01/27/2023 0432 by Luetta Nutting, RN Outcome: Progressing 01/26/2023 1943 by Luetta Nutting, RN Outcome: Not Progressing   Problem: Clinical Measurements: Goal: Ability to maintain clinical measurements within normal limits will improve 01/27/2023 0432 by Luetta Nutting, RN Outcome: Progressing 01/26/2023 1943 by Luetta Nutting, RN Outcome: Not Progressing Goal: Will remain free from infection 01/27/2023 0432 by Luetta Nutting, RN Outcome: Progressing 01/26/2023 1943 by Luetta Nutting, RN Outcome: Not Progressing Goal: Diagnostic test results will improve 01/27/2023 0432 by Luetta Nutting, RN Outcome: Progressing 01/26/2023 1943 by Luetta Nutting, RN Outcome: Not Progressing Goal: Respiratory  complications will improve 01/27/2023 0432 by Luetta Nutting, RN Outcome: Progressing 01/26/2023 1943 by Luetta Nutting, RN Outcome: Not Progressing Goal: Cardiovascular complication will be avoided 01/27/2023 0432 by Luetta Nutting, RN Outcome: Progressing 01/26/2023 1943 by Luetta Nutting, RN Outcome: Not Progressing  Problem: Activity: Goal: Risk for activity intolerance will decrease 01/27/2023 0432 by Luetta Nutting, RN Outcome: Progressing 01/26/2023 1943 by Luetta Nutting, RN Outcome: Not Progressing   Problem: Nutrition: Goal: Adequate nutrition will be maintained 01/27/2023 0432 by Luetta Nutting, RN Outcome: Progressing 01/26/2023 1943 by Luetta Nutting, RN Outcome: Not Progressing   Problem: Coping: Goal: Level of anxiety will decrease 01/27/2023 0432 by Luetta Nutting, RN Outcome: Progressing 01/26/2023 1943 by Luetta Nutting, RN Outcome: Not Progressing   Problem: Elimination: Goal: Will not experience complications related to bowel motility 01/27/2023 0432 by Luetta Nutting, RN Outcome: Progressing 01/26/2023 1943 by Luetta Nutting, RN Outcome: Not Progressing Goal: Will not experience complications related to urinary retention 01/27/2023 0432 by Luetta Nutting, RN Outcome: Progressing 01/26/2023 1943 by Luetta Nutting, RN Outcome: Not Progressing   Problem: Pain Management: Goal: General experience of comfort will improve 01/27/2023 0432 by Luetta Nutting, RN Outcome: Progressing 01/26/2023 1943 by Luetta Nutting, RN Outcome: Not Progressing   Problem: Safety: Goal: Ability to remain free from injury will improve 01/27/2023 0432 by Luetta Nutting, RN Outcome: Progressing 01/26/2023 1943 by Luetta Nutting, RN Outcome: Not Progressing   Problem: Skin Integrity: Goal: Risk for impaired skin integrity will decrease 01/27/2023 0432 by Luetta Nutting, RN Outcome: Progressing 01/26/2023 1943 by Luetta Nutting, RN Outcome: Not Progressing

## 2023-01-27 NOTE — TOC Transition Note (Signed)
Transition of Care [facility]) - CM/SW Discharge Note   Patient Details  Name: Melinda Gomez MRN: [id number] Date of Birth: 20-Sep-1945  Transition of Care [facility]) CM/SW Contact:  Renessa Wellnitz A Burna Atlas, RN Phone Number: 01/27/2023, 9:58 AM   Clinical Narrative:    [facility] ([facility]) will start Authorization today to transfer patient to their facility. Patient is medically ready when Berk[facility] Harvey is approved and bed is available. No further TOC needs at this time. TOC will continue to follow.   Final next level of care: IP Rehab Facility Barriers to Discharge: Other (must enter comment) (Going to [facility] when bed available)   Patient Goals and CMS Choice CMS Medicare.gov Compare Post Acute Care list provided to:: Patient Represenative (must comment) Choice offered to / list presented to : Spouse  Discharge Placement                  Patient to be transferred to facility by: Ambulance      Discharge Plan and Services Additional resources added to the After Visit Summary for                    DME Agency: NA       HH Arranged: NA HH Agency: NA        Social Determinants of Health (SDOH) Interventions SDOH Screenings   Tobacco Use: Medium Risk (01/14/2023)     Readmission Risk Interventions    01/27/2023    9:52 AM  Readmission Risk Prevention Plan  Transportation Screening Complete

## 2023-01-27 NOTE — Progress Notes (Signed)
Occupational Therapy Treatment Patient Details Name: Melinda Gomez MRN: [id number] DOB: 09/15/1945 Today's Date: 01/27/2023   History of present illness Melinda Gomez is a 77 y.o. female with medical history significant of CHF, COPD, CAD, GAD, GERD, hyperlipidemia, hypertension, presents to the ED with a chief complaint of difficulty swallowing.  Husband is patient's primary caretaker.  He reports he last saw her mid afternoon when he went out to work in the yard.  When he came back and she was complaining of a headache and asked for her rizatriptan.  It was the first time she had asked for it in 3 days.  He noted something was wrong when she could not pick it up with her hand.  Her left hand was not moving at all and her right hand could not get coordinated enough to pick it up.  He put in her mouth give her some Pepsi and she had a lot of difficulty swallowing it.  He does think she got it down.  After that he noted that her speech was not clear.  She had a left-sided droop as well.  He reports that prior to that she had been doing very well.  She even ambulated on her own to the bathroom and mostly took care of that herself except for redressing after using the toilet.  She has not been complaining of anything to him.  He called EMS who advised him to evaluate her pronator drift.  She could not lift her left arm.  They brought her in for stroke evaluation.  Unfortunately, patient is not able to provide much history as she is quite somnolent after her headache cocktail.   OT comments  Pt agreeable to OT and PT co-treatment. Pt was able to completed bed mobility with mod A and tranfers with min to mod A. Much improved A/ROM and strength in L UE. Pt was able to functionally use L UE to grasp RW and completed some seated L UE exercise. Pt still demonstrates some L visual neglect but shows significant improvement from last OT session. Pt left in the chair with family present. Pt will benefit from continued  OT in the hospital and recommended venue below to increase strength, balance, and endurance for safe ADL's.         If plan is discharge home, recommend the following:  A lot of help with bathing/dressing/bathroom;Assistance with cooking/housework;Assistance with feeding;Direct supervision/assist for medications management;Assist for transportation;Help with stairs or ramp for entrance;A lot of help with walking and/or transfers   Equipment Recommendations  None recommended by OT    Recommendations for Other Services      Precautions / Restrictions Precautions Precautions: Fall Restrictions Weight Bearing Restrictions: No       Mobility Bed Mobility Overal bed mobility: Needs Assistance Bed Mobility: Supine to Sit     Supine to sit: Mod assist     General bed mobility comments: Labored effort; assist to scoot to EOB    Transfers Overall transfer level: Needs assistance Equipment used: Rolling walker (2 wheels) Transfers: Sit to/from Stand, Bed to chair/wheelchair/BSC Sit to Stand: Min assist, Mod assist     Step pivot transfers: Mod assist     General transfer comment: Unsteady; labored effot; extended time; cuing for safety.     Balance Overall balance assessment: Needs assistance Sitting-balance support: No upper extremity supported, Feet supported Sitting balance-Leahy Scale: Fair Sitting balance - Comments: fair to good at EOB Postural control: Right lateral lean Standing balance support:  Bilateral upper extremity supported, During functional activity, Reliant on assistive device for balance Standing balance-Leahy Scale: Poor Standing balance comment: poor to fair using RW                           ADL either performed or assessed with clinical judgement   ADL                                       Functional mobility during ADLs: Moderate assistance;Minimal assistance;Rolling walker (2 wheels) General ADL Comments: Able to  ambulate in room and hall using RW for a short distance.    Extremity/Trunk Assessment Upper Extremity Assessment Upper Extremity Assessment: LUE deficits/detail LUE Deficits / Details: 3-/5 shoulder flexion; 3+/5 elbow flexion; 4-/5 grip.   Lower Extremity Assessment Lower Extremity Assessment: Defer to PT evaluation        Vision   Additional Comments: Pt appears to still have some L visual field deficits as noted by cuing needed to touch therapist's hand in L field of view.              Cognition Arousal: Alert Behavior During Therapy: Flat affect Overall Cognitive Status: Within Functional Limits for tasks assessed                                          Exercises Exercises: General Upper Extremity General Exercises - Upper Extremity Shoulder Flexion: AAROM, Left, 10 reps, Seated (x10 A/ROM for protratction as well.)                 Pertinent Vitals/ Pain       Pain Assessment Pain Assessment: No/denies pain                                                          Frequency  Min 2X/week        Progress Toward Goals  OT Goals(current goals can now be found in the care plan section)  Progress towards OT goals: Progressing toward goals  Acute Rehab OT Goals Patient Stated Goal: Improve function OT Goal Formulation: With family Time For Goal Achievement: 02/07/23 Potential to Achieve Goals: Fair ADL Goals Pt Will Perform Eating: with modified independence;sitting;with set-up Pt Will Perform Grooming: with min assist;sitting Pt Will Perform Upper Body Bathing: with min assist;sitting Pt Will Perform Upper Body Dressing: with min assist;sitting Pt Will Perform Lower Body Dressing: with mod assist;with adaptive equipment;sitting/lateral leans Pt Will Transfer to Toilet: with min assist;with mod assist;stand pivot transfer Pt Will Perform Toileting - Clothing Manipulation and hygiene: with min assist;with mod  assist;sitting/lateral leans Pt/caregiver will Perform Home Exercise Program: Increased ROM;Increased strength;Right Upper extremity;Left upper extremity;With minimal assist  Plan      Co-evaluation    PT/OT/SLP Co-Evaluation/Treatment: Yes Reason for Co-Treatment: Complexity of the patient's impairments (multi-system involvement)   OT goals addressed during session: ADL's and self-care                          End of Session Equipment Utilized During Treatment: Rolling walker (  2 wheels);Gait belt  OT Visit Diagnosis: Unsteadiness on feet (R26.81);Other abnormalities of gait and mobility (R26.89);Muscle weakness (generalized) (M62.81);Other symptoms and signs involving the nervous system (R29.898);Other symptoms and signs involving cognitive function;Hemiplegia and hemiparesis Hemiplegia - Right/Left: Left Hemiplegia - dominant/non-dominant: Non-Dominant Hemiplegia - caused by: Cerebral infarction   Activity Tolerance Patient tolerated treatment well   Patient Left in chair;with call bell/phone within reach;with chair alarm set             Time: 2956-2130 OT Time Calculation (min): 27 min  Charges: OT General Charges $OT Visit: 1 Visit OT Treatments $Therapeutic Exercise: 8-22 mins  Kristina Bertone OT, MOT   Danie Chandler 01/27/2023, 10:15 AM

## 2023-01-27 NOTE — Progress Notes (Signed)
Inpatient Rehab Admissions Coordinator:  ?Insurance authorization started. Will continue to follow. ? ? ?Adela Esteban Graves Madden, MS, CCC-SLP ?Admissions Coordinator ?[phone] ? ?

## 2023-01-27 NOTE — Progress Notes (Addendum)
PROGRESS NOTE  Melinda Gomez, is a 77 y.o. female, DOB - 08-19-1945, GUY:[id number]  Admit date - 01/23/2023   Admitting Physician Sona Nations Mariea Clonts, MD  Outpatient Primary MD for the patient is Thana Ates, MD  LOS - 3  Chief Complaint  Patient presents with   Code Stroke       Brief Narrative:  77 y.o. female with medical history significant of CHF, COPD, CAD, GAD, GERD, hyperlipidemia, hypertension admitted on 01/23/2023 with large acute right MCA stroke - Patient is medically stable for transfer to inpatient rehab when bed available and insurance authorization is obtained   -Assessment and Plan: 1)Large Acute Rt MCA CVA  --MRI brain shows Large evolving acute right MCA distribution infarct involving the right temporoccipital region. -CTA head and neck shows right ICA occlusionComplete occlusion of the right ICA just distal to the bifurcation, with reconstitution in the cavernous segment, likely retrograde, with overall poor opacification and possible additional occlusion near the terminus, as well as Occlusion of the right M1, with reconstitution just before the bifurcation, likely via collaterals.   - Neuro was consulted and determined the patient was a poor candidate for TNK and for thrombectomy due to poor functional status -LDL is 16 , HDL is 49----Even if her lipid panel is within desired limits, patient should still take Crestor/Statin for it's Pleiotropic effects (beyond cholesterol lowering benefits) -A1c 6.4 -Echo with EF of 55 to 60% without regional wall motion normalities, but has grade 1 diastolic dysfunction.  No AS and no MS  take Aspirin 81 mg daily along with Plavix 75 mg daily for 90 days then after that STOP the Plavix  and continue ONLY Aspirin 81 mg daily indefinitely--for secondary stroke Prevention (Per The multicenter SAMMPRIS trial) 01/26/23 -Interactive, verbal -Able to do more with the left side -Speech pathology eval appreciated recommends---Dysphagia  2 (Fine chop);Thin liquid   --PT OT eval appreciated recommends CIR/inpatient rehab --c/n Aspirin, plavix and Crestor as ordered (rectal aspirin until oral intake is reliable) Discussed with neurologist Dr. Melynda Ripple please see full Neuro consultation - 2)Chronic systolic CHF (congestive heart failure) (HCC) - -holding Entresto, metoprolol and spironolactone in the setting of permissive hypertension -Becoming more tachycardic--okay to start IV metoprolol until oral intake is reliable enough to restart oral metoprolol  3)GAD (generalized anxiety disorder) - Continue Xanax prn  4)Hypertension - Holding spironolactone, , Entresto for permissive hypertension -Tolerated IV metoprolol and started Florinef for tachycardia --- Restart oral metoprolol due to tachycardia  5)COPD (chronic obstructive pulmonary disease) (HCC) -Stable, no acute exacerbation, continue bronchodilators  6) social/ethics Plan of care and advanced directives discussed with daughter and husband at bedside, patient is a DNR/DNI  7)Fevers--- no further fevers -Currently afebrile -UA not suggestive of UTI -Chest x-ray is unchanged from 10/02/2022 with left lower lobe collapse and small effusion---similar to prior - 8)FEN--oral intake improving, No need for further IV fluids  Patient is medically stable for transfer to inpatient rehab when bed available and insurance authorization is obtained  Status is: Inpatient   Disposition: The patient is from: Home              Anticipated d/c is to: [facility]              Anticipated d/c date is: 1 day              Patient currently is medically stable to d/c. Barriers:--Awaiting transfer to inpatient rehab  Code Status :  -  Code Status: Limited: Do not  attempt resuscitation (DNR) -DNR-LIMITED -Do Not Intubate/DNI    Family Communication:   Discussed with daughter and Husband at bedside  DVT Prophylaxis  :   - SCDs  heparin injection 5,000 Units Start: 01/23/23 2200 SCD's Start:  01/23/23 2157   Lab Results  Component Value Date   PLT 219 01/27/2023   Inpatient Medications  Scheduled Meds:  aspirin EC  81 mg Oral Daily   clopidogrel  75 mg Oral Daily   fluticasone furoate-vilanterol  1 puff Inhalation Daily   And   umeclidinium bromide  1 puff Inhalation Daily   heparin  5,000 Units Subcutaneous Q8H   metoprolol tartrate  5 mg Intravenous Q6H   potassium chloride  40 mEq Oral Q3H   rosuvastatin  20 mg Oral Daily   venlafaxine XR  75 mg Oral Q breakfast   Continuous Infusions:   PRN Meds:.acetaminophen **OR** acetaminophen (TYLENOL) oral liquid 160 mg/5 mL **OR** acetaminophen, albuterol, ALPRAZolam, cholestyramine, fentaNYL (SUBLIMAZE) injection, senna-docusate   Anti-infectives (From admission, onward)    None       Subjective: [name] today has no emesis,  No chest pain - Husband at the bedside -No further fevers Patient is medically stable for transfer to inpatient rehab when bed available and insurance authorization is obtained  Objective: Vitals:   01/26/23 2003 01/27/23 0500 01/27/23 0753 01/27/23 1100  BP: (!) 163/75 (!) 141/85  (!) 153/78  Pulse: 90 92  91  Resp: 16 17  20   Temp: 98.9 F (37.2 C) 99.1 F (37.3 C)    TempSrc:  Oral    SpO2: 94% 92% 91%     Intake/Output Summary (Last 24 hours) at 01/27/2023 1309 Last data filed at 01/27/2023 0900 Gross per 24 hour  Intake 500 ml  Output 300 ml  Net 200 ml   Physical Exam Gen:-Awake, cooperative, no acute distress HEENT:- [place].AT, No sclera icterus Neck-Supple Neck,No JVD,.  Lungs-  CTAB , fair symmetrical air movement CV- S1, S2 normal, regular  Abd-  +ve B.Sounds, Abd Soft, No tenderness,    Extremity/Skin:- No  edema, pedal pulses present  Psych-affect is flat, able to follow instructions/comments Neuro--left-sided hemiparesis, facial Droop and dysarthria slightly improving  Data Reviewed: I have personally reviewed following labs and imaging  studies  CBC: Recent Labs  Lab 01/23/23 1739 01/23/23 1747 01/26/23 0505 01/27/23 0849  WBC 8.9  --  13.3* 12.9*  NEUTROABS 6.2  --   --   --   HGB 14.2 13.9 14.8 14.0  HCT 44.9 41.0 42.1 41.4  MCV 90.2  --  83.2 83.6  PLT 198  --  231 219   Basic Metabolic Panel: Recent Labs  Lab 01/23/23 1739 01/23/23 1747 01/26/23 0505 01/27/23 0849  NA 136 137 132* 133*  K 4.1 4.1 3.6 3.1*  CL 110 112* 106 105  CO2 16*  --  14* 18*  GLUCOSE 142* 135* 124* 131*  BUN 16 17 14 22   CREATININE 1.21* 1.20* 0.94 0.96  CALCIUM 9.1  --  8.7* 8.5*  PHOS  --   --   --  1.8*   Liver Function Tests: Recent Labs  Lab 01/23/23 1739 01/27/23 0849  AST 20  --   ALT 23  --   ALKPHOS 59  --   BILITOT 0.3  --   PROT 7.2  --   ALBUMIN 4.0 3.3*    Radiology Studies: DG CHEST PORT 1 VIEW  Result Date: 01/26/2023 CLINICAL DATA:  Fever EXAM:  PORTABLE CHEST 1 VIEW COMPARISON:  10/02/2022 FINDINGS: Similar appearance of retrocardiac left base collapse/consolidation with small effusion. Right lung clear. Interstitial markings are diffusely coarsened with chronic features. Cardiopericardial silhouette is at upper limits of normal for size. Bones are diffusely demineralized. Telemetry leads overlie the chest. IMPRESSION: Similar appearance of retrocardiac left base collapse/consolidation with small effusion. Electronically Signed   By: Kennith Center M.D.   On: 01/26/2023 09:00    Scheduled Meds:  aspirin EC  81 mg Oral Daily   clopidogrel  75 mg Oral Daily   fluticasone furoate-vilanterol  1 puff Inhalation Daily   And   umeclidinium bromide  1 puff Inhalation Daily   heparin  5,000 Units Subcutaneous Q8H   metoprolol tartrate  5 mg Intravenous Q6H   potassium chloride  40 mEq Oral Q3H   rosuvastatin  20 mg Oral Daily   venlafaxine XR  75 mg Oral Q breakfast   Continuous Infusions:   LOS: 3 days   Shon Hale M.D on 01/27/2023 at 1:10 PM  Go to [email or web address] - for contact info  [facility] - Office  [phone]  If 7PM-7AM, please contact night-coverage [email or web address] 01/27/2023, 1:10 PM

## 2023-01-27 NOTE — Progress Notes (Addendum)
I connected with  Melinda Gomez on 01/27/23 by a video enabled telemedicine application and verified that I am speaking with the correct person using two identifiers.   I discussed the limitations of evaluation and management by telemedicine. The patient expressed understanding and agreed to proceed.  Location of patient: [facility] Location of physician: [facility]  Subjective: No acute events overnight.  Daughter and husband at bedside.  ROS: negative except above  Examination  Vital signs in last 24 hours: Temp:  [98.6 F (37 C)-99.1 F (37.3 C)] 99.1 F (37.3 C) (11/04 0500) Pulse Rate:  [90-140] 92 (11/04 0500) Resp:  [16-18] 17 (11/04 0500) BP: (141-163)/(67-87) 141/85 (11/04 0500) SpO2:  [91 %-98 %] 91 % (11/04 0753)  General: lying in bed, NAD Neuro: AO x 2 (not oriented to time), no aphasia, no dysarthria, cranial nerves II resolutely intact except left hemianopia, antigravity strength in all 4 extremities with left hemiparesis (drifts), FTN intact bilaterally, reports sensory intact to light touch, difficult to assess neglect on tele    Basic Metabolic Panel: Recent Labs  Lab 01/23/23 1739 01/23/23 1747 01/26/23 0505  NA 136 137 132*  K 4.1 4.1 3.6  CL 110 112* 106  CO2 16*  --  14*  GLUCOSE 142* 135* 124*  BUN 16 17 14   CREATININE 1.21* 1.20* 0.94  CALCIUM 9.1  --  8.7*    CBC: Recent Labs  Lab 01/23/23 1739 01/23/23 1747 01/26/23 0505  WBC 8.9  --  13.3*  NEUTROABS 6.2  --   --   HGB 14.2 13.9 14.8  HCT 44.9 41.0 42.1  MCV 90.2  --  83.2  PLT 198  --  231     Coagulation Studies: No results for input(s): "LABPROT", "INR" in the last 72 hours.  Imaging MRI brain without contrast 01/24/2023 large evolving acute right MCA distribution infarct involving the right temporoccipital region. No associated hemorrhage or significant regional mass effect at this time.  Loss of normal flow void within the right ICA and MCA as above, consistent with  previously identified occlusion. Findings better characterized on prior CTA. Underlying age-related cerebral atrophy with moderate to advanced chronic  microvascular ischemic disease.  CT head without contrast 01/23/2023: Possible hypodensity and loss of gray-white differentiation in the right anterior temporal lobe, including the insula, which could represent an acute infarct. Possible hyperdense right MCA. No hemorrhage. ASPECTS is 10.  CTA head and neck with and without contrast 01/23/2023: Complete occlusion of the right ICA just distal to the bifurcation, with reconstitution in the cavernous segment, likely retrograde, with overall poor opacification and possible additional occlusion near the terminus.  Occlusion of the right M1, with reconstitution just before the bifurcation, likely via collaterals. The right MCA branches appear perfused, although the degree of opacification is diminished compared to the left MCA.  Near fetal origin of the right PCA from the right posterior communicating artery, which appears occluded near the ICA terminus. The right PCA and its branches are diminished in opacification compared to the left PCA. On perfusion imaging, the infarct core (58 mL) is in both the right MCA and PCA territory. 159 mL penumbra, with 101 mL mismatch. 50% stenosis in the proximal left ICA.  TTE 01/24/2023: Left ventricular ejection fraction 55 to 60%, no wall motion abnormalities, no thrombus, PFO not well-visualized.     ASSESSMENT AND PLAN: 77 year old female presented with symptoms consistent with right MCA syndrome. CTA head and neck reveals a right internal carotid  and right MCA occlusion along with possible right P-comm occlusion due to a fetal PCA.  She was not a candidate for thrombectomy due to modified Rankin score of 4-5 requiring 24/7 assistance with ADLs.  Acute ischemic stroke Etiology: Likely embolic  Recommendations -Aspirin 81 mg daily and Plavix 75 mg daily for 90 days  followed by monotherapy with aspirin 81 mg daily -LDL 16, continue rosuvastatin 20 mg daily -Recommend cardiac monitor to look for paroxysmal A-fib -Goal blood pressure: Normotension -PT/OT -Follow-up with neurology in 3 months (order placed). -Discussed plan with patient's daughter and husband at bedside -Discussed plan with Dr. Mariea Clonts via secure chat  I have spent a total of  36 minutes with the patient reviewing hospital notes,  test results, labs and examining the patient as well as establishing an assessment and plan that was discussed personally with the patient.  > 50% of time was spent in direct patient care.       Lindie Spruce Epilepsy [facility] For questions after 5pm please refer to AMION to reach the Neurologist on call

## 2023-01-28 DIAGNOSIS — I6389 Other cerebral infarction: Secondary | ICD-10-CM

## 2023-01-28 DIAGNOSIS — I5022 Chronic systolic (congestive) heart failure: Secondary | ICD-10-CM | POA: Diagnosis not present

## 2023-01-28 DIAGNOSIS — J410 Simple chronic bronchitis: Secondary | ICD-10-CM | POA: Diagnosis not present

## 2023-01-28 DIAGNOSIS — I10 Essential (primary) hypertension: Secondary | ICD-10-CM | POA: Diagnosis not present

## 2023-01-28 LAB — GLUCOSE, CAPILLARY
Glucose-Capillary: 139 mg/dL — ABNORMAL HIGH (ref 70–99)
Glucose-Capillary: 166 mg/dL — ABNORMAL HIGH (ref 70–99)
Glucose-Capillary: 144 mg/dL — ABNORMAL HIGH (ref 70–99)

## 2023-01-28 MED ORDER — ORAL CARE MOUTH RINSE: 15.0000 mL | OROMUCOSAL | Status: DC | PRN

## 2023-01-28 MED ORDER — SPIRONOLACTONE 12.5 MG HALF TABLET
12.5000 mg | ORAL_TABLET | Freq: Every day | ORAL | Status: DC
  Administered 2023-01-29 – 2023-01-31 (×3): 12.5 mg via ORAL
  Filled 2023-01-28 (×3): qty 1

## 2023-01-28 MED ORDER — ONDANSETRON HCL 4 MG/2ML IJ SOLN
4.0000 mg | Freq: Once | INTRAMUSCULAR | Status: AC
  Administered 2023-01-28: 4 mg via INTRAVENOUS
  Filled 2023-01-28: qty 2

## 2023-01-28 MED ORDER — SACUBITRIL-VALSARTAN 24-26 MG PO TABS
1.0000 | ORAL_TABLET | Freq: Two times a day (BID) | ORAL | Status: DC
  Administered 2023-01-28 – 2023-01-31 (×6): 1 via ORAL
  Filled 2023-01-28 (×6): qty 1

## 2023-01-28 NOTE — Progress Notes (Signed)
Inpatient Rehabilitation Admissions Coordinator   I await insurance determination for possible CIR admit at [facility] in [place].  Ottie Glazier, RN, MSN Rehab Admissions Coordinator [phone] 01/28/2023 10:57 AM

## 2023-01-28 NOTE — NC FL2 (Signed)
[place] MEDICAID FL2 LEVEL OF CARE FORM     IDENTIFICATION  Patient Name: Melinda Gomez Birthdate: 07/19/1945 Sex: female Admission Date (Current Location): 01/23/2023  [facility] and [place] Number:  [employer] and Address:  [facility],  [address] [place] [address]      Provider Number: [id number]  Attending Physician Name and Address:  Shon Hale, MD  Relative Name and Phone Number:  Pritika, Alvarez (Spouse)  [phone]    Current Level of Care: Hospital Recommended Level of Care: Skilled Nursing Facility Prior Approval Number:    Date Approved/Denied:   PASRR Number: [id number]  Discharge Plan: SNF    Current Diagnoses: Patient Active Problem List   Diagnosis Date Noted   Acute stroke due to ischemia [facility]) 01/24/2023   CVA (cerebral vascular accident) (HCC) 01/23/2023   Bronchiectasis without complication (HCC) 11/07/2022   Acute hypoxic respiratory failure (HCC) 10/02/2022   CAP (community acquired pneumonia) 10/02/2022   Sacral decubitus ulcer 10/02/2022   Chronic systolic CHF (congestive heart failure) (HCC) 05/01/2021   Hypokalemia 02/23/2021   Hypertension    Hypercholesteremia    COPD (chronic obstructive pulmonary disease) (HCC)    GAD (generalized anxiety disorder)    Chest pain 02/22/2021   PTSD (post-traumatic stress disorder) 01/02/2018   GERD (gastroesophageal reflux disease) 01/02/2018   Chronic insomnia 01/02/2018    Orientation RESPIRATION BLADDER Height & Weight     Self, Time, Situation, Place  Normal Continent Weight:   Height:     BEHAVIORAL SYMPTOMS/MOOD NEUROLOGICAL BOWEL NUTRITION STATUS      Continent Diet (See DC summary)  AMBULATORY STATUS COMMUNICATION OF NEEDS Skin   Extensive Assist Verbally Bruising                       Personal Care Assistance Level of Assistance  Bathing, Feeding, Dressing Bathing Assistance: Maximum assistance Feeding assistance: Limited  assistance Dressing Assistance: Maximum assistance     Functional Limitations Info  Sight, Hearing, Speech Sight Info: Adequate Hearing Info: Adequate Speech Info: Adequate    SPECIAL CARE FACTORS FREQUENCY  PT (By licensed PT)     PT Frequency: 5 times a week              Contractures Contractures Info: Not present    Additional Factors Info  Code Status, Allergies Code Status Info: DNR Allergies Info: Hydrochlorothiazide, lisinopril, norpramin, Estratest, Talwin, Verapamil           Current Medications (01/28/2023):  This is the current hospital active medication list Current Facility-Administered Medications  Medication Dose Route Frequency Provider Last Rate Last Admin   acetaminophen (TYLENOL) tablet 650 mg  650 mg Oral Q4H PRN Zierle-Ghosh, Asia B, DO   650 mg at 01/28/23 1302   Or   acetaminophen (TYLENOL) 160 MG/5ML solution 650 mg  650 mg Per Tube Q4H PRN Zierle-Ghosh, Asia B, DO   650 mg at 01/25/23 1807   Or   acetaminophen (TYLENOL) suppository 650 mg  650 mg Rectal Q4H PRN Zierle-Ghosh, Asia B, DO   650 mg at 01/25/23 2205   albuterol (VENTOLIN HFA) 108 (90 Base) MCG/ACT inhaler 2 puff  2 puff Inhalation Q4H PRN Zierle-Ghosh, Asia B, DO       ALPRAZolam (XANAX) tablet 0.5 mg  0.5 mg Oral TID PRN Zierle-Ghosh, Asia B, DO   0.5 mg at 01/26/23 2131   aspirin EC tablet 81 mg  81 mg Oral Daily Zierle-Ghosh, Asia B, DO  81 mg at 01/28/23 0852   cholestyramine (QUESTRAN) packet 4 g  4 g Oral Daily PRN Zierle-Ghosh, Asia B, DO       clopidogrel (PLAVIX) tablet 75 mg  75 mg Oral Daily Emokpae, Courage, MD   75 mg at 01/28/23 0852   fentaNYL (SUBLIMAZE) injection 25 mcg  25 mcg Intravenous Q4H PRN Shon Hale, MD   25 mcg at 01/26/23 1807   fluticasone furoate-vilanterol (BREO ELLIPTA) 100-25 MCG/ACT 1 puff  1 puff Inhalation Daily Zierle-Ghosh, Asia B, DO   1 puff at 01/28/23 0812   And   umeclidinium bromide (INCRUSE ELLIPTA) 62.5 MCG/ACT 1 puff  1 puff  Inhalation Daily Zierle-Ghosh, Asia B, DO   1 puff at 01/28/23 0812   heparin injection 5,000 Units  5,000 Units Subcutaneous Q8H Zierle-Ghosh, Asia B, DO   5,000 Units at 01/28/23 1309   metoprolol succinate (TOPROL-XL) 24 hr tablet 50 mg  50 mg Oral Daily Emokpae, Courage, MD   50 mg at 01/28/23 1610   Oral care mouth rinse  15 mL Mouth Rinse PRN Emokpae, Courage, MD       rosuvastatin (CRESTOR) tablet 20 mg  20 mg Oral Daily Zierle-Ghosh, Asia B, DO   20 mg at 01/28/23 9604   senna-docusate (Senokot-S) tablet 1 tablet  1 tablet Oral QHS PRN Zierle-Ghosh, Asia B, DO       SUMAtriptan (IMITREX) tablet 50 mg  50 mg Oral Q8H PRN Emokpae, Courage, MD   50 mg at 01/27/23 1400   venlafaxine XR (EFFEXOR-XR) 24 hr capsule 75 mg  75 mg Oral Q breakfast Zierle-Ghosh, Asia B, DO   75 mg at 01/28/23 5409     Discharge Medications: Please see discharge summary for a list of discharge medications.  Relevant Imaging Results:  Relevant Lab Results:   Additional Information SS# [id number]  Leitha Bleak, RN

## 2023-01-28 NOTE — Progress Notes (Addendum)
Inpatient Rehabilitation Admissions Coordinator   I have received a denial from [employer] for CIR admit. They do not feel she can tolerate the intensity of CIR rehab and would need a longer rehab recovery than we offer. I spoke with her daughter, Efraim Kaufmann, by phone at her bedside. She was made aware. I alerted acute team, TOC, and Dr Mariea Clonts of the determination . If Dr Mariea Clonts would like to discuss with Dr Logan Bores of [employer], I can provide his contact information. We will sign off.  Ottie Glazier, RN, MSN Rehab Admissions Coordinator [phone] 01/28/2023 2:33 PM

## 2023-01-28 NOTE — Progress Notes (Signed)
PROGRESS NOTE  Melinda Gomez, is a 77 y.o. female, DOB - 01-08-1946, [id number]  Admit date - 01/23/2023   Admitting Physician Emmarae Cowdery Mariea Clonts, MD  Outpatient Primary MD for the patient is Thana Ates, MD  LOS - 4  Chief Complaint  Patient presents with   Code Stroke       Brief Narrative:  77 y.o. female with medical history significant of CHF, COPD, CAD, GAD, GERD, hyperlipidemia, hypertension admitted on 01/23/2023 with large acute right MCA stroke - Received a Denial from [employer] for  CIR/inpatient rehab -Discussed with patient family and TOC will start to pursue SNF rehab instead Patient is medically stable for transfer to inpatient rehab when bed available and insurance authorization is obtained   -Assessment and Plan: 1)Large Acute Rt MCA CVA  --MRI brain shows Large evolving acute right MCA distribution infarct involving the right temporoccipital region. -CTA head and neck shows right ICA occlusionComplete occlusion of the right ICA just distal to the bifurcation, with reconstitution in the cavernous segment, likely retrograde, with overall poor opacification and possible additional occlusion near the terminus, as well as Occlusion of the right M1, with reconstitution just before the bifurcation, likely via collaterals.   - Neuro was consulted and determined the patient was a poor candidate for TNK and for thrombectomy due to poor functional status -LDL is 16 , HDL is 49----Even if her lipid panel is within desired limits, patient should still take Crestor/Statin for it's Pleiotropic effects (beyond cholesterol lowering benefits) -A1c 6.4 -Echo with EF of 55 to 60% without regional wall motion normalities, but has grade 1 diastolic dysfunction.  No AS and no MS  take Aspirin 81 mg daily along with Plavix 75 mg daily for 90 days then after that STOP the Plavix  and continue ONLY Aspirin 81 mg daily indefinitely--for secondary stroke Prevention (Per The  multicenter SAMMPRIS trial) 01/28/23 -Interactive, verbal -Able to do more with the left side -Speech pathology eval appreciated recommends---Dysphagia 2 (Fine chop);Thin liquid   --PT OT eval appreciated  -Received a Denial from [employer] for  CIR/inpatient rehab -Discussed with patient family and TOC will start to pursue SNF rehab instead --c/n Aspirin, plavix and Crestor   Discussed with neurologist Dr. Melynda Ripple please see full Neuro consultation - 2)Chronic systolic CHF (congestive heart failure) (HCC) - -We initially held Entresto, metoprolol and spironolactone in the setting of permissive hypertension -Okay to restart PTA cardiac medications at this time  3)GAD (generalized anxiety disorder) - Continue Xanax prn  4)Hypertension - Restart spironolactone, metoprolol and  Entresto   5)COPD (chronic obstructive pulmonary disease) (HCC) -Stable, no acute exacerbation, continue bronchodilators  6) social/ethics Plan of care and advanced directives discussed with daughter and husband at bedside, patient is a DNR/DNI  7)Fevers--- no further fevers -Currently afebrile -UA not suggestive of UTI -Chest x-ray is unchanged from 10/02/2022 with left lower lobe collapse and small effusion---similar to prior - 8)FEN--oral intake improving, No need for further IV fluids  Patient is medically stable for transfer to SNF rehab when bed available and insurance authorization is obtained  Status is: Inpatient   Disposition: The patient is from: Home              Anticipated d/c is to: SNF              Anticipated d/c date is: 1 day              Patient currently is medically stable to  d/c. Barriers:--Awaiting transfer to SNF rehab  Code Status :  -  Code Status: Limited: Do not attempt resuscitation (DNR) -DNR-LIMITED -Do Not Intubate/DNI    Family Communication:   Discussed with daughter and Husband at bedside  DVT Prophylaxis  :   - SCDs  heparin injection 5,000 Units Start:  01/23/23 2200 SCD's Start: 01/23/23 2157   Lab Results  Component Value Date   PLT 219 01/27/2023   Inpatient Medications  Scheduled Meds:  aspirin EC  81 mg Oral Daily   clopidogrel  75 mg Oral Daily   fluticasone furoate-vilanterol  1 puff Inhalation Daily   And   umeclidinium bromide  1 puff Inhalation Daily   heparin  5,000 Units Subcutaneous Q8H   metoprolol succinate  50 mg Oral Daily   rosuvastatin  20 mg Oral Daily   venlafaxine XR  75 mg Oral Q breakfast   Continuous Infusions:   PRN Meds:.acetaminophen **OR** acetaminophen (TYLENOL) oral liquid 160 mg/5 mL **OR** acetaminophen, albuterol, ALPRAZolam, cholestyramine, fentaNYL (SUBLIMAZE) injection, mouth rinse, senna-docusate, SUMAtriptan   Anti-infectives (From admission, onward)    None       Subjective: [name] today has no emesis,  No chest pain - Husband and daughter at the bedside -No further fevers -Received a Denial from [employer] for  CIR/inpatient rehab -Discussed with patient family and TOC will start to pursue SNF rehab instead Patient is medically stable for transfer to inpatient rehab when bed available and insurance authorization is obtained  Objective: Vitals:   01/27/23 1100 01/27/23 2002 01/28/23 0510 01/28/23 0814  BP: (!) 153/78 (!) 157/73 (!) 157/63   Pulse: 91 83 86   Resp: 20 20 18    Temp:  97.9 F (36.6 C) 98.6 F (37 C)   TempSrc:   Oral   SpO2:  94% 96% 90%    Intake/Output Summary (Last 24 hours) at 01/28/2023 1517 Last data filed at 01/28/2023 0800 Gross per 24 hour  Intake 720 ml  Output 350 ml  Net 370 ml   Physical Exam Gen:-Awake, cooperative, no acute distress HEENT:- [place].AT, No sclera icterus Neck-Supple Neck,No JVD,.  Lungs-  CTAB , fair symmetrical air movement CV- S1, S2 normal, regular  Abd-  +ve B.Sounds, Abd Soft, No tenderness, increased truncal adiposity    Extremity/Skin:- No  edema, pedal pulses present  Psych-affect is flat, able  to follow instructions/comments Neuro--left-sided hemiparesis, facial Droop and dysarthria slightly improving  Data Reviewed: I have personally reviewed following labs and imaging studies  CBC: Recent Labs  Lab 01/23/23 1739 01/23/23 1747 01/26/23 0505 01/27/23 0849  WBC 8.9  --  13.3* 12.9*  NEUTROABS 6.2  --   --   --   HGB 14.2 13.9 14.8 14.0  HCT 44.9 41.0 42.1 41.4  MCV 90.2  --  83.2 83.6  PLT 198  --  231 219   Basic Metabolic Panel: Recent Labs  Lab 01/23/23 1739 01/23/23 1747 01/26/23 0505 01/27/23 0849  NA 136 137 132* 133*  K 4.1 4.1 3.6 3.1*  CL 110 112* 106 105  CO2 16*  --  14* 18*  GLUCOSE 142* 135* 124* 131*  BUN 16 17 14 22   CREATININE 1.21* 1.20* 0.94 0.96  CALCIUM 9.1  --  8.7* 8.5*  PHOS  --   --   --  1.8*   Liver Function Tests: Recent Labs  Lab 01/23/23 1739 01/27/23 0849  AST 20  --   ALT 23  --  ALKPHOS 59  --   BILITOT 0.3  --   PROT 7.2  --   ALBUMIN 4.0 3.3*   Scheduled Meds:  aspirin EC  81 mg Oral Daily   clopidogrel  75 mg Oral Daily   fluticasone furoate-vilanterol  1 puff Inhalation Daily   And   umeclidinium bromide  1 puff Inhalation Daily   heparin  5,000 Units Subcutaneous Q8H   metoprolol succinate  50 mg Oral Daily   rosuvastatin  20 mg Oral Daily   venlafaxine XR  75 mg Oral Q breakfast   Continuous Infusions:   LOS: 4 days   Shon Hale M.D on 01/28/2023 at 3:17 PM  Go to [email or web address] - for contact info  [facility] - Office  [phone]  If 7PM-7AM, please contact night-coverage [email or web address] 01/28/2023, 3:17 PM

## 2023-01-28 NOTE — Progress Notes (Signed)
Physical Therapy Treatment Patient Details Name: Melinda Gomez MRN: [id number] DOB: 09/14/1945 Today's Date: 01/28/2023   History of Present Illness Melinda Gomez is a 77 y.o. female with medical history significant of CHF, COPD, CAD, GAD, GERD, hyperlipidemia, hypertension, presents to the ED with a chief complaint of difficulty swallowing.  Husband is patient's primary caretaker.  He reports he last saw her mid afternoon when he went out to work in the yard.  When he came back and she was complaining of a headache and asked for her rizatriptan.  It was the first time she had asked for it in 3 days.  He noted something was wrong when she could not pick it up with her hand.  Her left hand was not moving at all and her right hand could not get coordinated enough to pick it up.  He put in her mouth give her some Pepsi and she had a lot of difficulty swallowing it.  He does think she got it down.  After that he noted that her speech was not clear.  She had a left-sided droop as well.  He reports that prior to that she had been doing very well.  She even ambulated on her own to the bathroom and mostly took care of that herself except for redressing after using the toilet.  She has not been complaining of anything to him.  He called EMS who advised him to evaluate her pronator drift.  She could not lift her left arm.  They brought her in for stroke evaluation.  Unfortunately, patient is not able to provide much history as she is quite somnolent after her headache cocktail.    PT Comments  Pt sitting in chair with husband present upon arrival.  Difficult to keep aroused initially with tactile cues to complete therex in seated.  PT with difficulty scooting to edge of chair and maintaining upright seated posturing.  PT tends to lean backward into extension, only able to maintain with bil UE assist.  Max assist with standing as also into extension posturing and would not advance walker forward without therapist  directing.  Very small, shuffling steps with max cues.  Pt became fatigue following 5 steps and sat back down.     If plan is discharge home, recommend the following: A lot of help with bathing/dressing/bathroom;A lot of help with walking and/or transfers;Help with stairs or ramp for entrance;Assistance with cooking/housework   Can travel by private vehicle        Equipment Recommendations  None recommended by PT       Precautions / Restrictions Precautions Precautions: Fall Restrictions Weight Bearing Restrictions: No     Mobility  Bed Mobility               General bed mobility comments: pt was already up into chair upon therapist arrival    Transfers Overall transfer level: Needs assistance Equipment used: Rolling walker (2 wheels) Transfers: Sit to/from Stand Sit to Stand: Mod assist, Min assist           General transfer comment: pt with difficutly keeping weight shifted forward, tends to pull rather than push.  Extension posturing upon standing.  Max cues    Ambulation/Gait Ambulation/Gait assistance: Mod assist, Max assist Gait Distance (Feet): 5 Feet Assistive device: Rolling walker (2 wheels) Gait Pattern/deviations: Decreased step length - right, Decreased step length - left, Decreased stride length, Leaning posteriorly Gait velocity: slow     General Gait Details: pt required  max assist from therapist for walker navigation, advancing forward as would just stand in place.  Posterior posturing also today.         Balance  Difficulty establishing/maintaining seated balance     Cognition Arousal: Lethargic Behavior During Therapy: Flat affect Overall Cognitive Status: Within Functional Limits for tasks assessed Area of Impairment: Safety/judgement, Awareness                   Current Attention Level: Alternating                    Exercises General Exercises - Lower Extremity Long Arc Quad: Seated, AROM, Strengthening, Both,  10 reps Hip Flexion/Marching: Seated, AROM, Strengthening, Both, 10 reps Toe Raises: Seated, AROM, Strengthening, Both, 10 reps Heel Raises: Seated, AROM, Strengthening, Both, 10 reps    General Comments        Pertinent Vitals/Pain Pain Assessment Pain Assessment: No/denies pain     PT Goals (current goals can now be found in the care plan section) Acute Rehab PT Goals Patient Stated Goal: return home PT Goal Formulation: With patient/family Time For Goal Achievement: 03/07/23 Potential to Achieve Goals: Good    Frequency    Min 4X/week      PT Plan  Continue towards goals    Co-evaluation PT/OT/SLP Co-Evaluation/Treatment: Yes Reason for Co-Treatment: Complexity of the patient's impairments (multi-system involvement) PT goals addressed during session: Mobility/safety with mobility;Strengthening/ROM;Proper use of DME;Balance        AM-PAC PT "6 Clicks" Mobility   Outcome Measure  Help needed turning from your back to your side while in a flat bed without using bedrails?: A Lot Help needed moving from lying on your back to sitting on the side of a flat bed without using bedrails?: A Lot Help needed moving to and from a bed to a chair (including a wheelchair)?: A Lot Help needed standing up from a chair using your arms (e.g., wheelchair or bedside chair)?: A Lot Help needed to walk in hospital room?: A Lot Help needed climbing 3-5 steps with a railing? : A Lot 6 Click Score: 12    End of Session Equipment Utilized During Treatment: Gait belt Activity Tolerance: Patient tolerated treatment well;Patient limited by fatigue Patient left: in chair;with call bell/phone within reach;with family/visitor present Nurse Communication: Mobility status PT Visit Diagnosis: Unsteadiness on feet (R26.81);Other abnormalities of gait and mobility (R26.89);Muscle weakness (generalized) (M62.81)     Time: 1030-1055 PT Time Calculation (min) (ACUTE ONLY): 25 min  Charges:     $Gait Training: 8-22 mins $Therapeutic Exercise: 8-22 mins PT General Charges $$ ACUTE PT VISIT: 1 Visit                     Lurena Nida, PTA/CLT [facility] Ph: [phone]    Lurena Nida 01/28/2023, 11:36 AM

## 2023-01-28 NOTE — Plan of Care (Signed)
  Problem: Education: Goal: Knowledge of disease or condition will improve Outcome: Progressing Goal: Knowledge of patient specific risk factors will improve Loraine Leriche N/A or DELETE if not current risk factor) Outcome: Progressing   Problem: Ischemic Stroke/TIA Tissue Perfusion: Goal: Complications of ischemic stroke/TIA will be minimized Outcome: Progressing   Problem: Coping: Goal: Will verbalize positive feelings about self Outcome: Progressing Goal: Will identify appropriate support needs Outcome: Progressing   Problem: Health Behavior/Discharge Planning: Goal: Goals will be collaboratively established with patient/family Outcome: Progressing   Problem: Self-Care: Goal: Ability to participate in self-care as condition permits will improve Outcome: Progressing Goal: Verbalization of feelings and concerns over difficulty with self-care will improve Outcome: Progressing Goal: Ability to communicate needs accurately will improve Outcome: Progressing   Problem: Nutrition: Goal: Risk of aspiration will decrease Outcome: Progressing Goal: Dietary intake will improve Outcome: Progressing   Problem: Education: Goal: Knowledge of General Education information will improve Description: Including pain rating scale, medication(s)/side effects and non-pharmacologic comfort measures Outcome: Progressing   Problem: Clinical Measurements: Goal: Will remain free from infection Outcome: Progressing Goal: Diagnostic test results will improve Outcome: Progressing Goal: Respiratory complications will improve Outcome: Progressing Goal: Cardiovascular complication will be avoided Outcome: Progressing   Problem: Activity: Goal: Risk for activity intolerance will decrease Outcome: Progressing   Problem: Nutrition: Goal: Adequate nutrition will be maintained Outcome: Progressing   Problem: Coping: Goal: Level of anxiety will decrease Outcome: Progressing   Problem: Pain  Management: Goal: General experience of comfort will improve Outcome: Progressing   Problem: Safety: Goal: Ability to remain free from injury will improve Outcome: Progressing   Problem: Skin Integrity: Goal: Risk for impaired skin integrity will decrease Outcome: Progressing

## 2023-01-28 NOTE — TOC Progression Note (Addendum)
Transition of Care [facility]) - Progression Note    Patient Details  Name: Melinda Gomez MRN: [id number] Date of Birth: 07-12-1945  [facility] of [facility]) CM/SW Contact  Leitha Bleak, RN Phone Number: 01/28/2023, 3:15 PM  Clinical Narrative:   Insurance did not approve CIR. MD discussed SNF with family, they are agreeable. CM email her daughter CMS list with ratings. She replied back and is requesting [facility].  FL2 completed and sent out for bed offers to discuss with daughter.   INS AUTH started with [facility] for SNF and EMS, TOC following to added bed of choice.   Expected Discharge Plan: Skilled Nursing Facility Barriers to Discharge: Continued Medical Work up, No SNF bed, Insurance Authorization  Expected Discharge Plan and Services       Living arrangements for the past 2 months: Single Family Home

## 2023-01-29 DIAGNOSIS — I639 Cerebral infarction, unspecified: Secondary | ICD-10-CM | POA: Diagnosis not present

## 2023-01-29 DIAGNOSIS — I10 Essential (primary) hypertension: Secondary | ICD-10-CM | POA: Diagnosis not present

## 2023-01-29 DIAGNOSIS — I5022 Chronic systolic (congestive) heart failure: Secondary | ICD-10-CM | POA: Diagnosis not present

## 2023-01-29 DIAGNOSIS — F411 Generalized anxiety disorder: Secondary | ICD-10-CM | POA: Diagnosis not present

## 2023-01-29 LAB — GLUCOSE, CAPILLARY
Glucose-Capillary: 116 mg/dL — ABNORMAL HIGH (ref 70–99)
Glucose-Capillary: 143 mg/dL — ABNORMAL HIGH (ref 70–99)
Glucose-Capillary: 167 mg/dL — ABNORMAL HIGH (ref 70–99)

## 2023-01-29 NOTE — Plan of Care (Signed)
  Problem: Education: Goal: Knowledge of disease or condition will improve Outcome: Progressing   Problem: Education: Goal: Knowledge of secondary prevention will improve (MUST DOCUMENT ALL) Outcome: Progressing   

## 2023-01-29 NOTE — TOC Progression Note (Signed)
Transition of Care [facility] - Progression Note    Patient Details  Name: Melinda Gomez MRN: [id number] Date of Birth: May 18, 1945  Transition of Care [facility]) CM/SW Contact  Leitha Bleak, RN Phone Number: 01/29/2023, 10:52 AM  Clinical Narrative:   Discussed bed offer with her husband at the bedside. They accepted [facility] bed offer. CM confirmed bed with Rhonda and added bed choice to [facility] AUTH.    Expected Discharge Plan: Skilled Nursing Facility Barriers to Discharge: Continued Medical Work up, No SNF bed, English as a second language teacher  Expected Discharge Plan and Services      Living arrangements for the past 2 months: Single Family Home                  DME Agency: NA      HH Arranged: NA HH Agency: NA    Social Determinants of Health (SDOH) Interventions SDOH Screenings   Tobacco Use: Medium Risk (01/14/2023)

## 2023-01-29 NOTE — Plan of Care (Signed)
  Problem: Ischemic Stroke/TIA Tissue Perfusion: Goal: Complications of ischemic stroke/TIA will be minimized Outcome: Progressing   

## 2023-01-29 NOTE — Progress Notes (Signed)
PROGRESS NOTE  Melinda Gomez, is a 77 y.o. female, DOB - May 20, 1945, [id number]  Admit date - 01/23/2023   Admitting Physician Courage Mariea Clonts, MD  Outpatient Primary MD for the patient is Thana Ates, MD  LOS - 5  Chief Complaint  Patient presents with   Code Stroke       Brief Narrative:  77 y.o. female with medical history significant of CHF, COPD, CAD, GAD, GERD, hyperlipidemia, hypertension admitted on 01/23/2023 with large acute right MCA stroke - Received a Denial from [employer] for  CIR/inpatient rehab -Discussed with patient family and TOC will start to pursue SNF rehab instead Patient is medically stable for transfer to inpatient rehab when bed available and insurance authorization is obtained   -Assessment and Plan: 1)Large Acute Rt MCA CVA  --MRI brain shows Large evolving acute right MCA distribution infarct involving the right temporoccipital region. -CTA head and neck shows right ICA occlusionComplete occlusion of the right ICA just distal to the bifurcation, with reconstitution in the cavernous segment, likely retrograde, with overall poor opacification and possible additional occlusion near the terminus, as well as Occlusion of the right M1, with reconstitution just before the bifurcation, likely via collaterals.   - Neuro was consulted and determined the patient was a poor candidate for TNK and for thrombectomy due to poor functional status -LDL is 16 , HDL is 49----Even if her lipid panel is within desired limits, patient should still take Crestor/Statin for it's Pleiotropic effects (beyond cholesterol lowering benefits) -A1c 6.4 -Echo with EF of 55 to 60% without regional wall motion normalities, but has grade 1 diastolic dysfunction.  No AS and no MS  take Aspirin 81 mg daily along with Plavix 75 mg daily for 90 days then after that STOP the Plavix  and continue ONLY Aspirin 81 mg daily indefinitely--for secondary stroke Prevention (Per The  multicenter SAMMPRIS trial) 01/28/23 -Interactive, verbal -Able to do more with the left side -Speech pathology eval appreciated recommends---Dysphagia 2 (Fine chop);Thin liquid   --PT OT eval appreciated  -Received a Denial from [employer] for  CIR/inpatient rehab -Discussed with patient family and TOC will start to pursue SNF rehab instead. -Patient is medically stable for discharge. --c/n Aspirin, plavix and Crestor   --Case discussed with neurologist Dr. Melynda Ripple please see full Neuro consultation - 2)Chronic systolic CHF (congestive heart failure) (HCC) - -We initially held Entresto, metoprolol and spironolactone in the setting of permissive hypertension -Okay to restart PTA cardiac medications at this time  3)GAD (generalized anxiety disorder) - Continue Xanax prn  4)Hypertension - Restart spironolactone, metoprolol and  Entresto   5)COPD (chronic obstructive pulmonary disease) (HCC) -Stable, no acute exacerbation, continue bronchodilators  6) social/ethics Plan of care and advanced directives discussed with daughter and husband at bedside, patient is a DNR/DNI  7)Fevers--- no further fevers -Currently has remained afebrile -UA not suggestive of UTI -Chest x-ray is unchanged from 10/02/2022 with left lower lobe collapse and small effusion---similar to prior -Continue as needed antipyretics.  8)FEN--oral intake improving, No need for further IV fluids  Patient remains medically stable for transfer to SNF rehab when bed available and insurance authorization is obtained.  Status is: Inpatient   Disposition: The patient is from: Home              Anticipated d/c is to: SNF              Anticipated d/c date is: 1 day  Patient currently is medically stable to d/c. Barriers:--Awaiting transfer to SNF rehab  Code Status :  -  Code Status: Limited: Do not attempt resuscitation (DNR) -DNR-LIMITED -Do Not Intubate/DNI    Family Communication:   Discussed  with daughter and Husband at bedside  DVT Prophylaxis  :   - SCDs  heparin injection 5,000 Units Start: 01/23/23 2200 SCD's Start: 01/23/23 2157   Lab Results  Component Value Date   PLT 219 01/27/2023   Inpatient Medications  Scheduled Meds:  aspirin EC  81 mg Oral Daily   clopidogrel  75 mg Oral Daily   fluticasone furoate-vilanterol  1 puff Inhalation Daily   And   umeclidinium bromide  1 puff Inhalation Daily   heparin  5,000 Units Subcutaneous Q8H   metoprolol succinate  50 mg Oral Daily   rosuvastatin  20 mg Oral Daily   sacubitril-valsartan  1 tablet Oral BID   spironolactone  12.5 mg Oral Daily   venlafaxine XR  75 mg Oral Q breakfast   Continuous Infusions:   PRN Meds:.acetaminophen **OR** acetaminophen (TYLENOL) oral liquid 160 mg/5 mL **OR** acetaminophen, albuterol, ALPRAZolam, cholestyramine, fentaNYL (SUBLIMAZE) injection, mouth rinse, senna-docusate, SUMAtriptan   Anti-infectives (From admission, onward)    None       Subjective: Melinda Gomez afebrile, no chest pain, no nausea, no vomiting.  Following commands appropriately, no new focal deficits and in no acute distress.  Objective: Vitals:   01/28/23 2100 01/29/23 0436 01/29/23 0830 01/29/23 1445  BP: (!) 158/68 (!) 134/50  137/66  Pulse: 74 74  81  Resp: 18 18  17   Temp: 98.1 F (36.7 C) 98.5 F (36.9 C)  98.7 F (37.1 C)  TempSrc: Oral Oral  Oral  SpO2: 97% 97% 97% 97%    Intake/Output Summary (Last 24 hours) at 01/29/2023 1757 Last data filed at 01/29/2023 1300 Gross per 24 hour  Intake 480 ml  Output --  Net 480 ml   Physical Exam General exam: Alert, awake, cooperative, following commands appropriately and in no acute distress. Respiratory system: Clear to auscultation. Respiratory effort normal.  Good saturation on room air. Cardiovascular system:RRR. No murmurs, rubs, gallops. Gastrointestinal system: Abdomen is obese, nondistended, soft and nontender. No organomegaly or masses  felt. Normal bowel sounds heard. Central nervous system: No new focal neurological deficits.  Continue demonstrating improvement in left-sided hemiparesis, facial droop and dysarthria. Extremities: No sign of clubbing. Skin: No petechiae. Psychiatry: Judgement and insight appear normal. Mood & affect appropriate.   Data Reviewed: I have personally reviewed following labs and imaging studies  CBC: Recent Labs  Lab 01/23/23 1739 01/23/23 1747 01/26/23 0505 01/27/23 0849  WBC 8.9  --  13.3* 12.9*  NEUTROABS 6.2  --   --   --   HGB 14.2 13.9 14.8 14.0  HCT 44.9 41.0 42.1 41.4  MCV 90.2  --  83.2 83.6  PLT 198  --  231 219   Basic Metabolic Panel: Recent Labs  Lab 01/23/23 1739 01/23/23 1747 01/26/23 0505 01/27/23 0849  NA 136 137 132* 133*  K 4.1 4.1 3.6 3.1*  CL 110 112* 106 105  CO2 16*  --  14* 18*  GLUCOSE 142* 135* 124* 131*  BUN 16 17 14 22   CREATININE 1.21* 1.20* 0.94 0.96  CALCIUM 9.1  --  8.7* 8.5*  PHOS  --   --   --  1.8*   Liver Function Tests: Recent Labs  Lab 01/23/23 1739 01/27/23 0849  AST 20  --  ALT 23  --   ALKPHOS 59  --   BILITOT 0.3  --   PROT 7.2  --   ALBUMIN 4.0 3.3*   Scheduled Meds:  aspirin EC  81 mg Oral Daily   clopidogrel  75 mg Oral Daily   fluticasone furoate-vilanterol  1 puff Inhalation Daily   And   umeclidinium bromide  1 puff Inhalation Daily   heparin  5,000 Units Subcutaneous Q8H   metoprolol succinate  50 mg Oral Daily   rosuvastatin  20 mg Oral Daily   sacubitril-valsartan  1 tablet Oral BID   spironolactone  12.5 mg Oral Daily   venlafaxine XR  75 mg Oral Q breakfast   Continuous Infusions:   LOS: 5 days   Vassie Loll M.D on 01/29/2023 at 5:57 PM  Go to [email or web address] - for contact info  [facility] - Office  [phone]  If 7PM-7AM, please contact night-coverage [email or web address] 01/29/2023, 5:57 PM

## 2023-01-29 NOTE — Progress Notes (Signed)
Speech Language Pathology Treatment: Dysphagia  Patient Details Name: Melinda Gomez MRN: [id number] DOB: 1945/06/15 Today's Date: 01/29/2023 Time: 6606-3016 SLP Time Calculation (min) (ACUTE ONLY): 29 min  Assessment / Plan / Recommendation Clinical Impression  Ongoing dysphagia therapy provided today; Pt was easily roused and repositioned to upright in the chair. Pt consumed sips of thin liquid via straw and ate a cup of icecream without overt s/sx of oropharyngeal dysphagia. Pt's husband reports good tolerance of D2/fine chop diet. He does report some impulsivity when Pt self feeds. SLP educated husband that he should remind Pt to slow down. Recommend continue with D2/fine chop diet and thin liquids. There are no further ST needs noted while in acute setting, however, recommend ST f/u at SNF to ensure diet tolerance there. Pt may also benefit from SLE work up at d/c location. Thank you for allowing [place] to care for this patient, our service will sign off.    HPI HPI: Melinda Gomez is a 77 y.o. female with medical history significant of CHF, COPD, CAD, GAD, GERD, hyperlipidemia, hypertension, presents to the ED with a chief complaint of difficulty swallowing.  Husband is patient's primary caretaker.  He reports he last saw her mid afternoon when he went out to work in the yard.  When he came back and she was complaining of a headache and asked for her rizatriptan.  It was the first time she had asked for it in 3 days.  He noted something was wrong when she could not pick it up with her hand.  Her left hand was not moving at all and her right hand could not get coordinated enough to pick it up.  He put in her mouth give her some Pepsi and she had a lot of difficulty swallowing it.  He does think she got it down.  After that he noted that her speech was not clear.  She had a left-sided droop as well.  He reports that prior to that she had been doing very well.  She even ambulated on her own to the bathroom  and mostly took care of that herself except for redressing after using the toilet.  She has not been complaining of anything to him.  He called EMS who advised him to evaluate her pronator drift.  She could not lift her left arm.  They brought her in for stroke evaluation.  Unfortunately, patient is not able to provide much history as she is quite somnolent after her headache cocktail. Pt failed the [facility] stroke swallow screen on 11/1 and was not alert later that day or the following day for BSE. BSE requested and Pt is alert to participate today      SLP Plan  All goals met      Recommendations for follow up therapy are one component of a multi-disciplinary discharge planning process, led by the attending physician.  Recommendations may be updated based on patient status, additional functional criteria and insurance authorization.    Recommendations  Diet recommendations: Dysphagia 2 (fine chop);Thin liquid Liquids provided via: Cup;Straw Supervision: Patient able to self feed Compensations: Minimize environmental distractions;Slow rate;Small sips/bites Postural Changes and/or Swallow Maneuvers: Seated upright 90 degrees                  Oral care BID           All goals met   Melinda Gomez, CCC-SLP Speech Language Pathologist   Melinda Gomez  01/29/2023, 3:09 PM

## 2023-01-30 DIAGNOSIS — J449 Chronic obstructive pulmonary disease, unspecified: Secondary | ICD-10-CM | POA: Diagnosis not present

## 2023-01-30 DIAGNOSIS — F5105 Insomnia due to other mental disorder: Secondary | ICD-10-CM

## 2023-01-30 DIAGNOSIS — E7800 Pure hypercholesterolemia, unspecified: Secondary | ICD-10-CM | POA: Diagnosis not present

## 2023-01-30 DIAGNOSIS — I639 Cerebral infarction, unspecified: Secondary | ICD-10-CM | POA: Diagnosis not present

## 2023-01-30 DIAGNOSIS — I10 Essential (primary) hypertension: Secondary | ICD-10-CM | POA: Diagnosis not present

## 2023-01-30 MED ORDER — ACETAMINOPHEN 500 MG PO TABS: 1000.0000 mg | ORAL_TABLET | Freq: Three times a day (TID) | ORAL | Status: AC | PRN

## 2023-01-30 MED ORDER — ASPIRIN 81 MG PO TBEC: 81.0000 mg | DELAYED_RELEASE_TABLET | Freq: Every day | ORAL | Status: AC

## 2023-01-30 MED ORDER — CLOPIDOGREL BISULFATE 75 MG PO TABS: 75.0000 mg | ORAL_TABLET | Freq: Every day | ORAL | 1 refills | Status: DC

## 2023-01-30 MED ORDER — ALPRAZOLAM 0.5 MG PO TABS: 0.5000 mg | ORAL_TABLET | Freq: Three times a day (TID) | ORAL | 0 refills | Status: DC | PRN

## 2023-01-30 MED ORDER — TRAZODONE HCL 100 MG PO TABS: 100.0000 mg | ORAL_TABLET | Freq: Every evening | ORAL | Status: DC | PRN

## 2023-01-30 NOTE — Discharge Summary (Signed)
Physician Discharge Summary   Patient: Melinda Gomez MRN: [id number] DOB: 03-14-1946  Admit date:     01/23/2023  Discharge date: 01/31/23  Discharge Physician: Vassie Loll   PCP: Thana Ates, MD   Recommendations at discharge:  Repeat basic metabolic panel to follow ultralights renal function Reassess blood pressure with further adjustment to antihypertensive regimen as needed Continue close monitoring of patient volume status with adjustment to diuretic regimen. Outpatient follow-up with cardiology service for further 2 weeks on her GDMT CHF management. Outpatient follow-up with neurology as instructed.  Discharge Diagnoses: Principal Problem:   CVA (cerebral vascular accident) (HCC) Active Problems:   Chronic systolic CHF (congestive heart failure) (HCC)   Hypertension   GAD (generalized anxiety disorder)   Hypercholesteremia   COPD (chronic obstructive pulmonary disease) (HCC)   Acute stroke due to ischemia (HCC)   Insomnia due to mental condition  Brief Hospital admission course: As per H&P written by Dr. Carren Rang on 01/23/23 Melinda Gomez is a 77 y.o. female with medical history significant of CHF, COPD, CAD, GAD, GERD, hyperlipidemia, hypertension, presents to the ED with a chief complaint of difficulty swallowing.  Husband is patient's primary caretaker.  He reports he last saw her mid afternoon when he went out to work in the yard.  When he came back and she was complaining of a headache and asked for her rizatriptan.  It was the first time she had asked for it in 3 days.  He noted something was wrong when she could not pick it up with her hand.  Her left hand was not moving at all and her right hand could not get coordinated enough to pick it up.  He put in her mouth give her some Pepsi and she had a lot of difficulty swallowing it.  He does think she got it down.  After that he noted that her speech was not clear.  She had a left-sided droop as well.  He reports  that prior to that she had been doing very well.  She even ambulated on her own to the bathroom and mostly took care of that herself except for redressing after using the toilet.  She has not been complaining of anything to him.  He called EMS who advised him to evaluate her pronator drift.  She could not lift her left arm.  They brought her in for stroke evaluation.  Unfortunately, patient is not able to provide much history as she is quite somnolent after her headache cocktail.   Patient does not smoke and does not drink. Patient has ACP documents showing that she is DNR and family confirmed that at bedside.  Assessment and Plan: 1)Large Acute Rt MCA CVA  --MRI brain shows Large evolving acute right MCA distribution infarct involving the right temporoccipital region. -CTA head and neck shows right ICA occlusionComplete occlusion of the right ICA just distal to the bifurcation, with reconstitution in the cavernous segment, likely retrograde, with overall poor opacification and possible additional occlusion near the terminus, as well as Occlusion of the right M1, with reconstitution just before the bifurcation, likely via collaterals.   - Neuro was consulted and determined the patient was a poor candidate for TNK and for thrombectomy due to poor functional status -LDL is 16 , HDL is 49----Even if her lipid panel is within desired limits, patient should still take Crestor/Statin for it's Pleiotropic effects (beyond cholesterol lowering benefits) -A1c 6.4 -Echo with EF of 55 to 60% without regional wall  motion normalities, but has grade 1 diastolic dysfunction.  No AS and no MS  take Aspirin 81 mg daily along with Plavix 75 mg daily for 90 days then after that STOP the Plavix  and continue ONLY Aspirin 81 mg daily indefinitely--for secondary stroke Prevention (Per The multicenter SAMMPRIS trial) 01/28/23 -Interactive, verbal -Able to do more with the left side -Speech pathology eval appreciated  recommends---Dysphagia 2 (Fine chop);Thin liquid   --PT OT eval appreciated  -Received a Denial from [facility] for  CIR/inpatient rehab. -Care Discussed with patient's family and TOC started to pursue SNF rehab instead.  Patient will be transfer to a skilled nursing facility on 01/31/2023 for further rehabilitation and care. -Continue aspirin, plavix and Crestor . -Outpatient follow-up with neurology service. --Case discussed with neurologist Dr. Melynda Ripple please see full Neuro consultation for further details.  2)Chronic systolic CHF (congestive heart failure) (HCC) - -We initially held Entresto, metoprolol and spironolactone in the setting of permissive hypertension -Okay to restart PTA cardiac medications at this time. -Continue to follow daily weights, follow low-sodium diet and continue patient follow-up with cardiology service.   3)GAD (generalized anxiety disorder) - Continue Xanax prn -Stable mood at discharge.   4)Hypertension - Restarted on spironolactone, metoprolol and  Entresto  -Continue to follow vital signs. -Heart healthy/low-sodium diet discussed with patient.   5)COPD (chronic obstructive pulmonary disease) (HCC) -Stable, no acute exacerbation, continue home bronchodilators regimen. -Good saturation on room air.   6) social/ethics Plan of care and advanced directives discussed with daughter and husband at bedside, patient is a DNR/DNI. -Goal DNR form has been signed and patient will take it to receiving facility.   7)Fevers--- no further fevers -Currently has remained afebrile -UA not suggestive of UTI -Chest x-ray is unchanged from 10/02/2022 with left lower lobe collapse and small effusion---similar to prior -Continue as needed antipyretics.   8)FEN--oral intake improving, No need for further IV fluids -Patient able to adequately maintain nutrition and hydration following normal consistency and thin liquids.  Consultants: Neurology service;  CIR. Procedures performed: See below for x-ray reports.  2D echo. Disposition: Skilled nursing facility Diet recommendation: Heart healthy/low-sodium diet.  DISCHARGE MEDICATION: Allergies as of 01/30/2023       Reactions   Hydrochlorothiazide Other (See Comments)   hyponatremia   Lisinopril Cough   Norpramin [desipramine] Other (See Comments)   Jittery   Other Other (See Comments)   Estratest  depression & palpitation   Talwin [pentazocine] Other (See Comments)   hallucinations   Verapamil Cough        Medication List     STOP taking these medications    risperiDONE 1 MG tablet Commonly known as: RISPERDAL       TAKE these medications    acetaminophen 500 MG tablet Commonly known as: TYLENOL Take 2 tablets (1,000 mg total) by mouth every 8 (eight) hours as needed for fever, headache or mild pain (pain score 1-3).   albuterol 108 (90 Base) MCG/ACT inhaler Commonly known as: VENTOLIN HFA Inhale 2 puffs into the lungs every 4 (four) hours as needed for shortness of breath or wheezing.   ALPRAZolam 0.5 MG tablet Commonly known as: XANAX Take 1 tablet (0.5 mg total) by mouth 3 (three) times daily as needed for anxiety.   aspirin EC 81 MG tablet Take 1 tablet (81 mg total) by mouth daily. Swallow whole. Start taking on: January 31, 2023   Breztri Aerosphere 160-9-4.8 MCG/ACT Aero Generic drug: Budeson-Glycopyrrol-Formoterol Inhale 2 puffs into  the lungs in the morning and at bedtime.   cetirizine 10 MG tablet Commonly known as: ZYRTEC Take 10 mg by mouth daily.   cholestyramine 4 GM/DOSE powder Commonly known as: QUESTRAN Take 4 g by mouth See admin instructions. 13 grams added to 2-6 oz of water as needed   clopidogrel 75 MG tablet Commonly known as: PLAVIX Take 1 tablet (75 mg total) by mouth daily. Start taking on: January 31, 2023   dapagliflozin propanediol 10 MG Tabs tablet Commonly known as: FARXIGA Take 1 tablet (10 mg total) by mouth daily  before breakfast.   Entresto 24-26 MG Generic drug: sacubitril-valsartan Take 1 tablet by mouth 2 (two) times daily.   metoprolol succinate 50 MG 24 hr tablet Commonly known as: TOPROL-XL Take 1 tablet (50 mg total) by mouth daily for heart and blood pressure. Take with or immediately following a meal.   promethazine 12.5 MG tablet Commonly known as: PHENERGAN Take 12.5 mg by mouth every 6 (six) hours as needed for nausea.   rizatriptan 5 MG tablet Commonly known as: MAXALT Take 5 mg by mouth as needed for migraine.   rosuvastatin 20 MG tablet Commonly known as: CRESTOR Take 20 mg by mouth daily.   spironolactone 25 MG tablet Commonly known as: ALDACTONE TAKE 1/2 TABLET BY MOUTH DAILY FOR FLUID. What changed: See the new instructions.   traZODone 100 MG tablet Commonly known as: DESYREL Take 1 tablet (100 mg total) by mouth at bedtime as needed for sleep. TAKE 1 TO2 TABLETS AT BEDTIME AS NEEDED FOR SLEEP. USE THE LOWEST DOSE THAT WORKS. What changed:  how much to take how to take this when to take this reasons to take this   venlafaxine XR 75 MG 24 hr capsule Commonly known as: EFFEXOR-XR 1 capsule in the morning and 2 capsules in the evening What changed:  how much to take how to take this when to take this        Contact information for follow-up providers     Thana Ates, MD. Schedule an appointment as soon as possible for a visit in 10 day(s).   Specialty: Internal Medicine Why: After discharge from the skilled nursing facility. Contact information: [address] [place] [address] [phone]              Contact information for after-discharge care     Destination     [facility] Preferred SNF .   Service: Skilled Nursing Contact information: [address] [place] [address] [phone]                    Discharge Exam: General exam: Alert, awake,  cooperative, following commands appropriately and in no acute distress. Respiratory system: Clear to auscultation. Respiratory effort normal.  Good saturation on room air. Cardiovascular system:RRR. No murmurs, rubs, gallops. Gastrointestinal system: Abdomen is obese, nondistended, soft and nontender. No organomegaly or masses felt. Normal bowel sounds heard. Central nervous system: No new focal neurological deficits.  Continue demonstrating improvement in left-sided hemiparesis, facial droop and dysarthria. Extremities: No sign of clubbing. Skin: No petechiae. Psychiatry: Judgement and insight appear normal. Mood & affect appropriate.   Condition at discharge: Stable and improved.  The results of significant diagnostics from this hospitalization (including imaging, microbiology, ancillary and laboratory) are listed below for reference.   Imaging Studies: DG CHEST PORT 1 VIEW  Result Date: 01/26/2023 CLINICAL DATA:  Fever EXAM: PORTABLE CHEST 1 VIEW COMPARISON:  10/02/2022 FINDINGS: Similar appearance of retrocardiac left base collapse/consolidation with small effusion. Right lung clear. Interstitial markings are diffusely coarsened with chronic features. Cardiopericardial silhouette is at upper limits of normal for size. Bones are diffusely demineralized. Telemetry leads overlie the chest. IMPRESSION: Similar appearance of retrocardiac left base collapse/consolidation with small effusion. Electronically Signed   By: Kennith Center M.D.   On: 01/26/2023 09:00   ECHOCARDIOGRAM COMPLETE  Result Date: 01/24/2023    ECHOCARDIOGRAM REPORT   Patient Name:   ANNELY SLIVA Date of Exam: 01/24/2023 Medical Rec #:  [id number]       Height:       62.0 in Accession #:    [id number]      Weight:       157.2 lb Date of Birth:  17-Sep-1945        BSA:          1.726 m Patient Age:    77 years        BP:           143/64 mmHg Patient Gender: F               HR:           77 bpm. [facility]  Procedure: 2D Echo, Cardiac Doppler and Color Doppler Indications:    Stroke I63.9  History:        Patient has prior history of Echocardiogram examinations, most                 recent 05/14/2022. Risk Factors:Hypertension, Diabetes,                 Dyslipidemia and Former Smoker.  Sonographer:    Celesta Gentile RCS Referring Phys: [id number] ASIA B ZIERLE-GHOSH  Sonographer Comments: Technically difficult study due to poor echo windows. IMPRESSIONS  1. Left ventricular ejection fraction, by estimation, is 55 to 60%. The left ventricle has normal function. The left ventricle has no regional wall motion abnormalities. Left ventricular diastolic parameters are consistent with Grade I diastolic dysfunction (impaired relaxation). Elevated left ventricular end-diastolic pressure.  2. Right ventricular systolic function is low normal. The right ventricular size is normal. Moderately increased right ventricular wall thickness.  3. The mitral valve is normal in structure. No evidence of mitral valve regurgitation. No evidence of mitral stenosis.  4. The aortic valve is tricuspid. Aortic valve regurgitation is not visualized. No aortic stenosis is present.  5. The inferior vena cava is normal in size but collapsibility could not be evaluated. Comparison(s): No significant change from prior study. Conclusion(s)/Recommendation(s): No left ventricular mural or apical thrombus/thrombi. FINDINGS  Left Ventricle: Left ventricular ejection fraction, by estimation, is 55 to 60%. The left ventricle has normal function. The left ventricle has no regional wall motion abnormalities. Definity contrast agent was given IV to delineate the left ventricular  endocardial borders. The left ventricular internal cavity size was normal in size. There is no left ventricular hypertrophy. Abnormal (paradoxical) septal motion, consistent with left bundle branch block. Left ventricular diastolic parameters are consistent with Grade I diastolic dysfunction  (impaired relaxation). Elevated left ventricular end-diastolic pressure. Right Ventricle: The right ventricular size is normal. Moderately increased right ventricular wall thickness. Right ventricular systolic function is low normal. Left Atrium: Left atrial size was normal in size. Right Atrium: Right atrial size was normal in size. Pericardium: There is no evidence of pericardial effusion. Mitral Valve: The mitral valve is normal in structure. No evidence of  mitral valve regurgitation. No evidence of mitral valve stenosis. Tricuspid Valve: The tricuspid valve is not well visualized. Tricuspid valve regurgitation is not demonstrated. No evidence of tricuspid stenosis. Aortic Valve: The aortic valve is tricuspid. Aortic valve regurgitation is not visualized. No aortic stenosis is present. Pulmonic Valve: The pulmonic valve was not well visualized. Pulmonic valve regurgitation is not visualized. No evidence of pulmonic stenosis. Aorta: The aortic root is normal in size and structure. Venous: The inferior vena cava is normal in size but collapsibility could not be evaluated. IAS/Shunts: The interatrial septum was not well visualized.  LEFT VENTRICLE PLAX 2D LVIDd:         4.40 cm   Diastology LVIDs:         2.40 cm   LV e' medial:    4.79 cm/s LV PW:         0.90 cm   LV E/e' medial:  15.6 LV IVS:        0.90 cm   LV e' lateral:   5.33 cm/s LVOT diam:     2.00 cm   LV E/e' lateral: 14.0 LV SV:         63 LV SV Index:   37 LVOT Area:     3.14 cm  RIGHT VENTRICLE RV S prime:     8.49 cm/s TAPSE (M-mode): 1.5 cm LEFT ATRIUM             Index        RIGHT ATRIUM           Index LA diam:        2.90 cm 1.68 cm/m   RA Area:     11.10 cm LA Vol (A2C):   40.9 ml 23.70 ml/m  RA Volume:   22.30 ml  12.92 ml/m LA Vol (A4C):   40.8 ml 23.64 ml/m LA Biplane Vol: 40.9 ml 23.70 ml/m  AORTIC VALVE LVOT Vmax:   102.00 cm/s LVOT Vmean:  67.000 cm/s LVOT VTI:    0.202 m  AORTA Ao Root diam: 3.00 cm MITRAL VALVE MV Area (PHT):  2.91 cm     SHUNTS MV Decel Time: 261 msec     Systemic VTI:  0.20 m MV E velocity: 74.70 cm/s   Systemic Diam: 2.00 cm MV A velocity: 101.00 cm/s MV E/A ratio:  0.74 Vishnu Priya Mallipeddi Electronically signed by Winfield Rast Mallipeddi Signature Date/Time: 01/24/2023/1:18:03 PM    Final    MR BRAIN WO CONTRAST  Result Date: 01/24/2023 CLINICAL DATA:  Follow-up examination for stroke. EXAM: MRI HEAD WITHOUT CONTRAST TECHNIQUE: Multiplanar, multiecho pulse sequences of the brain and surrounding structures were obtained without intravenous contrast. COMPARISON:  Prior CTs from earlier the same day. FINDINGS: Brain: Mild age-related cerebral atrophy. Patchy and confluent T2/FLAIR hyperintensity involving the periventricular and deep white matter both cerebral hemispheres, distant with chronic small vessel ischemic disease, moderate to advanced in nature. Mild patchy involvement of the pons noted. Large confluent area of restricted diffusion involving the right temporal occipital region, consistent with an evolving acute right MCA distribution infarct (series 5, image 23). No associated hemorrhage. Early gyral swelling and edema without significant regional mass effect at this time. No other evidence for acute or subacute ischemia elsewhere. No acute or chronic intracranial blood products. No mass lesion, midline shift or mass effect. No hydrocephalus or extra-axial fluid collection. Pituitary gland and suprasellar region within normal limits. Vascular: Loss of normal flow void within the right ICA to the terminus,  consistent with previously identified occlusion. Irregular and attenuated flow related signal noted within the right MCA distribution as well. Skull and upper cervical spine: Craniocervical junction within normal limits. Bone marrow signal intensity normal. No scalp soft tissue abnormality. Sinuses/Orbits: Right gaze preference. Prior bilateral ocular lens replacement. Changes of chronic left sphenoid  sinusitis. Paranasal sinuses are otherwise clear. Trace right mastoid effusion, of doubtful significance. Other: None. IMPRESSION: 1. Large evolving acute right MCA distribution infarct involving the right temporoccipital region. No associated hemorrhage or significant regional mass effect at this time. 2. Loss of normal flow void within the right ICA and MCA as above, consistent with previously identified occlusion. Findings better characterized on prior CTA. 3. Underlying age-related cerebral atrophy with moderate to advanced chronic microvascular ischemic disease. Electronically Signed   By: Rise Mu M.D.   On: 01/24/2023 00:27   CT ANGIO HEAD NECK W WO CM W PERF (CODE STROKE)  Result Date: 01/23/2023 CLINICAL DATA:  Left-sided weakness, left-sided facial droop EXAM: CT ANGIOGRAPHY HEAD AND NECK WITH AND WITHOUT CONTRAST CT PERFUSION TECHNIQUE: Multidetector CT imaging of the head and neck was performed using the standard protocol during bolus administration of intravenous contrast. Multiplanar CT image reconstructions and MIPs were obtained to evaluate the vascular anatomy. Carotid stenosis measurements (when applicable) are obtained utilizing NASCET criteria, using the distal internal carotid diameter as the denominator. Multiphase CT imaging of the brain was performed following IV bolus contrast injection. Subsequent parametric perfusion maps were calculated using RAPID software. RADIATION DOSE REDUCTION: This exam was performed according to the departmental dose-optimization program which includes automated exposure control, adjustment of the mA and/or kV according to patient size and/or use of iterative reconstruction technique. COMPARISON:  CT head 01/23/2023 FINDINGS: CT HEAD FINDINGS For noncontrast findings, please see same day CT head. CTA NECK FINDINGS Aortic arch: Choose 1 aortic atherosclerosis. Right carotid system: Complete occlusion of the right ICA just distal to the bifurcation  (series 6, image 229), without evidence of reconstitution until the cavernous segment. The common carotid and external carotid are patent. Left carotid system: 50% stenosis in the proximal left ICA. No evidence of dissection. Vertebral arteries: No evidence of dissection, occlusion, or hemodynamically significant stenosis (greater than 50%). Skeleton: No acute osseous abnormality. Degenerative changes in the cervical spine. Other neck: No acute finding. Upper chest: Multiple small pulmonary nodules in the right upper lobe, the largest of which measures up to 5 mm. No pleural effusion. Review of the MIP images confirms the above findings CTA HEAD FINDINGS Anterior circulation: Reconstitution of the right ICA in the cavernous segment, likely retrograde, with overall poor opacification and possible additional occlusion near the terminus (appear 6, image 106). The left ICA is patent to the terminus without significant stenosis. Occlusion of the right M1 (series 6, image 104), with reconstitution just before the bifurcation, likely via collaterals. The right MCA branches appear perfused, although the degree of opacification is diminished compared to the left MCA. No focal stenosis in the right M 2 and more distal MCA branches. The left MCA is patent to its distal aspects without significant stenosis. A1 segments patent. Normal anterior communicating artery. Anterior cerebral arteries are patent to their distal aspects without significant stenosis. No M1 stenosis or occlusion. MCA branches perfused to their distal aspects without significant stenosis. Posterior circulation: Vertebral arteries patent to the vertebrobasilar junction without significant stenosis. Posterior inferior cerebellar arteries patent proximally. Basilar patent to its distal aspect without significant stenosis. Superior cerebellar arteries patent proximally. Patent  left P1. Likely hypoplastic right P1. Near fetal origin of the right PCA from the right  posterior communicating artery, which appears thrombosed near the ICA terminus (series 6, image 111-112). The right PCA and its branches are diminished in opacification compared to the left PCA. The left PCA is patent from its origin through its distal branches, without significant stenosis. The left posterior communicating artery is not definitively visualized. Venous sinuses: As permitted by contrast timing, patent. Anatomic variants: Near fetal origin of the right PCA. No evidence of aneurysm or vascular malformation. Review of the MIP images confirms the above findings CT Brain Perfusion Findings: ASPECTS: 9 CBF (<30%) Volume: 58mL Perfusion (Tmax>6.0s) volume: Mismatch Volume: Infarction Location:Infarct is primarily in the right temporal lobe and inferior frontal lobe, in the right MCA and, to a lesser extent, some of the PCA territory, with the penumbra involving the remainder of the right MCA territory and some of the right PCA territory. IMPRESSION: 1. Complete occlusion of the right ICA just distal to the bifurcation, with reconstitution in the cavernous segment, likely retrograde, with overall poor opacification and possible additional occlusion near the terminus. 2. Occlusion of the right M1, with reconstitution just before the bifurcation, likely via collaterals. The right MCA branches appear perfused, although the degree of opacification is diminished compared to the left MCA. 3. Near fetal origin of the right PCA from the right posterior communicating artery, which appears occluded near the ICA terminus. The right PCA and its branches are diminished in opacification compared to the left PCA. 4. On perfusion imaging, the infarct core (58 mL) is in both the right MCA and PCA territory. 159 mL penumbra, with 101 mL mismatch 5. 50% stenosis in the proximal left ICA. 6. Multiple small pulmonary nodules in the right upper lobe, the largest of which measures up to 5 mm. No follow-up needed if  patient is low-risk (and has no known or suspected primary neoplasm). Non-contrast chest CT can be considered in 12 months if patient is high-risk. This recommendation follows the consensus statement: Guidelines for Management of Incidental Pulmonary Nodules Detected on CT Images: From the Fleischner Society 2017; Radiology 2017; 284:228-243. 7. Aortic atherosclerosis. Aortic Atherosclerosis (ICD10-I70.0). Code stroke imaging results from the CTA were communicated on 01/23/2023 at 6:03 pm to provider [facility] and at 6:15 p.m. to Dr. Wilford Corner via telephone, who verbally acknowledged these results. Electronically Signed   By: Wiliam Ke M.D.   On: 01/23/2023 18:19   CT HEAD CODE STROKE WO CONTRAST  Result Date: 01/23/2023 CLINICAL DATA:  Code stroke.  Stroke suspected EXAM: CT HEAD WITHOUT CONTRAST TECHNIQUE: Contiguous axial images were obtained from the base of the skull through the vertex without intravenous contrast. RADIATION DOSE REDUCTION: This exam was performed according to the departmental dose-optimization program which includes automated exposure control, adjustment of the mA and/or kV according to patient size and/or use of iterative reconstruction technique. COMPARISON:  None Available. FINDINGS: Brain: Possible hypodensity and loss of gray-white differentiation in the right anterior temporal lobe, including the anterior insula. No evidence of acute hemorrhage, mass, mass effect, or midline shift. No hydrocephalus or extra-axial collection. Periventricular white matter changes, likely the sequela of chronic small vessel ischemic disease. Vascular: Possible hyperdense right MCA. Skull: Negative for fracture or focal lesion. Sinuses/Orbits: Chronic left sphenoid sinusitis, with complete opacification and osseous thickening. Otherwise clear paranasal sinuses. Status post bilateral lens replacements. Other: The mastoid air cells are well aerated. ASPECTS [facility] Stroke Program Early CT Score) -  Ganglionic level infarction (caudate, lentiform nuclei, internal capsule, insula, M1-M3 cortex): 6 - Supraganglionic infarction (M4-M6 cortex): 3 Total score (0-10 with 10 being normal): 10 IMPRESSION: 1. Possible hypodensity and loss of gray-white differentiation in the right anterior temporal lobe, including the insula, which could represent an acute infarct. Possible hyperdense right MCA. No hemorrhage. 2. ASPECTS is 10. Code stroke imaging results were communicated on 01/23/2023 at 5:51 pm to provider [facility] via telephone, who verbally acknowledged these results. Electronically Signed   By: Wiliam Ke M.D.   On: 01/23/2023 17:53    Microbiology: Results for orders placed or performed during the hospital encounter of 02/22/21  MRSA Next Gen by PCR, Nasal     Status: None   Collection Time: 02/23/21 12:34 AM   Specimen: Nasal Mucosa; Nasal Swab  Result Value Ref Range Status   MRSA by PCR Next Gen NOT DETECTED NOT DETECTED Final    Comment: (NOTE) The GeneXpert MRSA Assay (FDA approved for NASAL specimens only), is one component of a comprehensive MRSA colonization surveillance program. It is not intended to diagnose MRSA infection nor to guide or monitor treatment for MRSA infections. Test performance is not FDA approved in patients less than 60 years old. Performed at [facility], [address] [place], [place] [address]     Labs: CBC: Recent Labs  Lab 01/23/23 1739 01/23/23 1747 01/26/23 0505 01/27/23 0849  WBC 8.9  --  13.3* 12.9*  NEUTROABS 6.2  --   --   --   HGB 14.2 13.9 14.8 14.0  HCT 44.9 41.0 42.1 41.4  MCV 90.2  --  83.2 83.6  PLT 198  --  231 219   Basic Metabolic Panel: Recent Labs  Lab 01/23/23 1739 01/23/23 1747 01/26/23 0505 01/27/23 0849  NA 136 137 132* 133*  K 4.1 4.1 3.6 3.1*  CL 110 112* 106 105  CO2 16*  --  14* 18*  GLUCOSE 142* 135* 124* 131*  BUN 16 17 14 22   CREATININE 1.21* 1.20* 0.94 0.96  CALCIUM 9.1  --  8.7* 8.5*  PHOS   --   --   --  1.8*   Liver Function Tests: Recent Labs  Lab 01/23/23 1739 01/27/23 0849  AST 20  --   ALT 23  --   ALKPHOS 59  --   BILITOT 0.3  --   PROT 7.2  --   ALBUMIN 4.0 3.3*   CBG: Recent Labs  Lab 01/28/23 1100 01/28/23 1618 01/29/23 0737 01/29/23 1115 01/29/23 2355  GLUCAP 166* 144* 143* 116* 167*    Discharge time spent: greater than 30 minutes.  Signed: Vassie Loll, MD [facility] 01/30/2023

## 2023-01-30 NOTE — Plan of Care (Signed)
  Problem: Education: Goal: Knowledge of disease or condition will improve Outcome: Progressing   Problem: Ischemic Stroke/TIA Tissue Perfusion: Goal: Complications of ischemic stroke/TIA will be minimized Outcome: Progressing   

## 2023-01-30 NOTE — Care Management Important Message (Signed)
Important Message  Patient Details  Name: Melinda Gomez MRN: [id number] Date of Birth: 1946-01-31   Important Message Given:  Yes - Medicare IM     Corey Harold 01/30/2023, 11:46 AM

## 2023-01-30 NOTE — TOC Progression Note (Signed)
Transition of Care [facility]) - Progression Note    Patient Details  Name: Melinda Gomez MRN: [id number] Date of Birth: 08-27-1945  Transition of Care [facility]) CM/SW Contact  Geni Bers, RN Phone Number: 01/30/2023, 2:21 PM  Clinical Narrative:    THA called insurance auth for [facility] [id number], [facility] [phone].    Expected Discharge Plan: Skilled Nursing Facility Barriers to Discharge: Continued Medical Work up, No SNF bed, English as a second language teacher  Expected Discharge Plan and Services       Living arrangements for the past 2 months: Single Family Home                   DME Agency: NA       HH Arranged: NA HH Agency: NA         Social Determinants of Health (SDOH) Interventions SDOH Screenings   Tobacco Use: Medium Risk (01/14/2023)    Readmission Risk Interventions    01/27/2023    9:52 AM  Readmission Risk Prevention Plan  Transportation Screening Complete

## 2023-01-30 NOTE — TOC Progression Note (Signed)
Transition of Care [facility]) - Progression Note    Patient Details  Name: Melinda Gomez MRN: [id number] Date of Birth: 11/02/1945  Transition of Care [facility]) CM/SW Contact  Geni Bers, RN Phone Number: 01/30/2023, 10:12 AM  Clinical Narrative:    Insurance authorization pending.    Expected Discharge Plan: Skilled Nursing Facility Barriers to Discharge: Continued Medical Work up, No SNF bed, English as a second language teacher  Expected Discharge Plan and Services       Living arrangements for the past 2 months: Single Family Home                   DME Agency: NA       HH Arranged: NA HH Agency: NA         Social Determinants of Health (SDOH) Interventions SDOH Screenings   Tobacco Use: Medium Risk (01/14/2023)    Readmission Risk Interventions    01/27/2023    9:52 AM  Readmission Risk Prevention Plan  Transportation Screening Complete

## 2023-01-30 NOTE — Progress Notes (Signed)
Physical Therapy Treatment Patient Details Name: Melinda Gomez MRN: [id number] DOB: 1946-02-11 Today's Date: 01/30/2023   History of Present Illness Melinda Gomez is a 77 y.o. female with medical history significant of CHF, COPD, CAD, GAD, GERD, hyperlipidemia, hypertension, presents to the ED with a chief complaint of difficulty swallowing.  Husband is patient's primary caretaker.  He reports he last saw her mid afternoon when he went out to work in the yard.  When he came back and she was complaining of a headache and asked for her rizatriptan.  It was the first time she had asked for it in 3 days.  He noted something was wrong when she could not pick it up with her hand.  Her left hand was not moving at all and her right hand could not get coordinated enough to pick it up.  He put in her mouth give her some Pepsi and she had a lot of difficulty swallowing it.  He does think she got it down.  After that he noted that her speech was not clear.  She had a left-sided droop as well.  He reports that prior to that she had been doing very well.  She even ambulated on her own to the bathroom and mostly took care of that herself except for redressing after using the toilet.  She has not been complaining of anything to him.  He called EMS who advised him to evaluate her pronator drift.  She could not lift her left arm.  They brought her in for stroke evaluation.  Unfortunately, patient is not able to provide much history as she is quite somnolent after her headache cocktail.    PT Comments  Pt asleep in chair upon therapist entrance with husband in room, easily awaken and willing to participate with therapy.  Mod A with transfer training and cueing for hand and foot placement as well as forward lean to assist with standing.  Pt lethargic but followed commands.  Gait training complete beside bed for safety with cueing to advance Lt foot as tendency to pause and just stand rather than walk.  Pt limited by fatigue  with activity.  EOS pt left in chair where she completed seated therex for strengthening, cueing for full range of motion.  Call bell within reach and husband present in room.     If plan is discharge home, recommend the following:     Can travel by private vehicle        Equipment Recommendations       Recommendations for Other Services       Precautions / Restrictions Precautions Precautions: Fall Restrictions Weight Bearing Restrictions: No     Mobility  Bed Mobility               General bed mobility comments: pt was already up into chair upon therapist arrival Patient Response: Flat affect, Cooperative  Transfers Overall transfer level: Needs assistance Equipment used: Rolling walker (2 wheels) Transfers: Sit to/from Stand Sit to Stand: Mod assist           General transfer comment: Cueing for hand and foot placement to assist with sit to stand, cueing to lean forward to stand.  Used RW for stability upon standing wiht no LOB    Ambulation/Gait Ambulation/Gait assistance: Mod assist Gait Distance (Feet): 10 Feet Assistive device: Rolling walker (2 wheels) Gait Pattern/deviations: Decreased step length - right, Decreased step length - left, Decreased stride length, Leaning posteriorly Gait velocity: slow  General Gait Details: Forward/backward gait beside bed for safety with cueing to advance Lt foot as would pause and stand in place   Stairs             Wheelchair Mobility     Tilt Bed Tilt Bed Patient Response: Flat affect, Cooperative  Modified Rankin (Stroke Patients Only)       Balance                                            Cognition Arousal: Lethargic Behavior During Therapy: Flat affect Overall Cognitive Status: Within Functional Limits for tasks assessed Area of Impairment: Safety/judgement, Awareness                   Current Attention Level: Alternating           General Comments:  continued to be letharic throughout session        Exercises General Exercises - Lower Extremity Long Arc Quad: Seated, AROM, Strengthening, Both, 10 reps Hip Flexion/Marching: Strengthening, Both, 10 reps, Standing (marching in place wiht RW) Toe Raises: Seated, AROM, Strengthening, Both, 10 reps Heel Raises: Seated, AROM, Strengthening, Both, 10 reps    General Comments        Pertinent Vitals/Pain Pain Assessment Pain Assessment: No/denies pain    Home Living                          Prior Function            PT Goals (current goals can now be found in the care plan section)      Frequency           PT Plan      Co-evaluation              AM-PAC PT "6 Clicks" Mobility   Outcome Measure  Help needed turning from your back to your side while in a flat bed without using bedrails?: A Lot Help needed moving from lying on your back to sitting on the side of a flat bed without using bedrails?: A Lot Help needed moving to and from a bed to a chair (including a wheelchair)?: A Lot Help needed standing up from a chair using your arms (e.g., wheelchair or bedside chair)?: A Lot Help needed to walk in hospital room?: A Lot Help needed climbing 3-5 steps with a railing? : A Lot 6 Click Score: 12    End of Session Equipment Utilized During Treatment: Gait belt Activity Tolerance: Patient tolerated treatment well;Patient limited by fatigue Patient left: in chair;with call bell/phone within reach;with family/visitor present Nurse Communication: Mobility status       Time: 2130-8657 PT Time Calculation (min) (ACUTE ONLY): 23 min  Charges:    $Therapeutic Exercise: 8-22 mins $Therapeutic Activity: 8-22 mins PT General Charges $$ ACUTE PT VISIT: 1 Visit                     Becky Sax, LPTA/CLT; CBIS [phone]  Juel Burrow 01/30/2023, 1:01 PM

## 2023-01-31 ENCOUNTER — Other Ambulatory Visit: Payer: Self-pay

## 2023-01-31 ENCOUNTER — Encounter (HOSPITAL_COMMUNITY): Payer: Self-pay | Admitting: Family Medicine

## 2023-01-31 LAB — GLUCOSE, CAPILLARY: Glucose-Capillary: 147 mg/dL — ABNORMAL HIGH (ref 70–99)

## 2023-01-31 NOTE — TOC Progression Note (Signed)
Transition of Care [facility]) - Progression Note    Patient Details  Name: SATCHA LEGLEITER MRN: [id number] Date of Birth: [birth year]  Transition of Care [facility]) CM/SW Contact  Geni Bers, RN Phone Number: 01/31/2023, 10:12 AM  Clinical Narrative:    Spoke with Mr. Brautigam to make aware that pt would discharge to [facility] today. [facility] was called for transport.    Expected Discharge Plan: Skilled Nursing Facility Barriers to Discharge: Continued Medical Work up, No SNF bed, Insurance Authorization  Expected Discharge Plan and Services       Living arrangements for the past 2 months: Single Family Home Expected Discharge Date: 01/30/23                 DME Agency: NA       HH Arranged: NA HH Agency: NA         Social Determinants of Health (SDOH) Interventions SDOH Screenings   Food Insecurity: No Food Insecurity (01/31/2023)  Housing: Low Risk  (01/31/2023)  Transportation Needs: No Transportation Needs (01/31/2023)  Utilities: Not At Risk (01/31/2023)  Tobacco Use: Medium Risk (01/31/2023)    Readmission Risk Interventions    01/27/2023    9:52 AM  Readmission Risk Prevention Plan  Transportation Screening Complete

## 2023-01-31 NOTE — Progress Notes (Signed)
Report called to Nunzio Cory at [employer].

## 2023-01-31 NOTE — Progress Notes (Signed)
Patient removed IV. Zierle-Ghosh notified. Told okay to leave IV out since patient is supposed to discharge today.

## 2023-01-31 NOTE — Progress Notes (Signed)
Patient seen and examined; has remained medically stable and is ready for discharge.  Patient's chart has been reviewed and not changes needed to discharge summary written on 01/30/2023.  Patient going to skilled nursing facility for further care and rehabilitation.  Vassie Loll MD [phone]

## 2023-01-31 NOTE — Plan of Care (Signed)
  Problem: Education: Goal: Knowledge of disease or condition will improve Outcome: Adequate for Discharge   Problem: Ischemic Stroke/TIA Tissue Perfusion: Goal: Complications of ischemic stroke/TIA will be minimized Outcome: Adequate for Discharge   Problem: Coping: Goal: Will verbalize positive feelings about self Outcome: Adequate for Discharge Goal: Will identify appropriate support needs Outcome: Adequate for Discharge

## 2023-01-31 NOTE — Care Management Important Message (Signed)
Important Message  Patient Details  Name: Melinda Gomez MRN: [id number] Date of Birth: May 30, 1945   Important Message Given:  Yes - Medicare IM     Corey Harold 01/31/2023, 10:35 AM

## 2023-01-31 NOTE — Plan of Care (Signed)
  Problem: Education: Goal: Knowledge of disease or condition will improve 01/31/2023 0912 by Haze Rushing, LPN Outcome: Adequate for Discharge 01/31/2023 0911 by Haze Rushing, LPN Outcome: Adequate for Discharge   Problem: Ischemic Stroke/TIA Tissue Perfusion: Goal: Complications of ischemic stroke/TIA will be minimized 01/31/2023 0912 by Haze Rushing, LPN Outcome: Adequate for Discharge 01/31/2023 0911 by Haze Rushing, LPN Outcome: Adequate for Discharge   Problem: Coping: Goal: Will verbalize positive feelings about self 01/31/2023 0912 by Haze Rushing, LPN Outcome: Adequate for Discharge 01/31/2023 0911 by Haze Rushing, LPN Outcome: Adequate for Discharge Goal: Will identify appropriate support needs 01/31/2023 0912 by Haze Rushing, LPN Outcome: Adequate for Discharge 01/31/2023 0911 by Haze Rushing, LPN Outcome: Adequate for Discharge

## 2023-02-02 ENCOUNTER — Other Ambulatory Visit: Payer: Self-pay | Admitting: Psychiatry

## 2023-02-02 DIAGNOSIS — F4310 Post-traumatic stress disorder, unspecified: Secondary | ICD-10-CM

## 2023-02-02 DIAGNOSIS — F331 Major depressive disorder, recurrent, moderate: Secondary | ICD-10-CM

## 2023-02-03 NOTE — Telephone Encounter (Signed)
10/21 LF; LV 10/22; NV 04/22

## 2023-02-07 ENCOUNTER — Ambulatory Visit: Payer: PPO | Admitting: Cardiovascular Disease

## 2023-03-03 ENCOUNTER — Encounter: Payer: Self-pay | Admitting: Cardiovascular Disease

## 2023-03-03 NOTE — Telephone Encounter (Signed)
Called and spoke to daughter.   RN informed daughter- it maybe wise to do patient assistance this month.  Also check to see if patient has  Part -D plan.  What would be on patient's formulary.   RN informed daughter to bring information and will fax.  Discussed the upcoming changes with possible patient assistance.  Daughter verbalized understanding.

## 2023-03-04 ENCOUNTER — Emergency Department (HOSPITAL_COMMUNITY): Payer: PPO

## 2023-03-04 ENCOUNTER — Encounter (HOSPITAL_COMMUNITY): Payer: Self-pay | Admitting: *Deleted

## 2023-03-04 ENCOUNTER — Other Ambulatory Visit: Payer: Self-pay

## 2023-03-04 ENCOUNTER — Emergency Department (HOSPITAL_COMMUNITY)
Admission: EM | Admit: 2023-03-04 | Discharge: 2023-03-04 | Disposition: A | Discharge status: Home / Self Care | Payer: PPO | Attending: Emergency Medicine | Admitting: Emergency Medicine

## 2023-03-04 DIAGNOSIS — Z79899 Other long term (current) drug therapy: Secondary | ICD-10-CM | POA: Diagnosis not present

## 2023-03-04 DIAGNOSIS — Z87891 Personal history of nicotine dependence: Secondary | ICD-10-CM | POA: Diagnosis not present

## 2023-03-04 DIAGNOSIS — Z7982 Long term (current) use of aspirin: Secondary | ICD-10-CM | POA: Insufficient documentation

## 2023-03-04 DIAGNOSIS — I2510 Atherosclerotic heart disease of native coronary artery without angina pectoris: Secondary | ICD-10-CM | POA: Diagnosis not present

## 2023-03-04 DIAGNOSIS — I110 Hypertensive heart disease with heart failure: Secondary | ICD-10-CM | POA: Diagnosis not present

## 2023-03-04 DIAGNOSIS — J449 Chronic obstructive pulmonary disease, unspecified: Secondary | ICD-10-CM | POA: Diagnosis not present

## 2023-03-04 DIAGNOSIS — R531 Weakness: Secondary | ICD-10-CM | POA: Insufficient documentation

## 2023-03-04 DIAGNOSIS — Z20822 Contact with and (suspected) exposure to covid-19: Secondary | ICD-10-CM | POA: Diagnosis not present

## 2023-03-04 DIAGNOSIS — R410 Disorientation, unspecified: Secondary | ICD-10-CM | POA: Insufficient documentation

## 2023-03-04 DIAGNOSIS — I509 Heart failure, unspecified: Secondary | ICD-10-CM | POA: Diagnosis not present

## 2023-03-04 DIAGNOSIS — R262 Difficulty in walking, not elsewhere classified: Secondary | ICD-10-CM | POA: Diagnosis not present

## 2023-03-04 LAB — BASIC METABOLIC PANEL
Anion gap: 13 (ref 5–15)
BUN: 18 mg/dL (ref 8–23)
CO2: 18 mmol/L — ABNORMAL LOW (ref 22–32)
Calcium: 9.7 mg/dL (ref 8.9–10.3)
Chloride: 108 mmol/L (ref 98–111)
Creatinine, Ser: 1.52 mg/dL — ABNORMAL HIGH (ref 0.44–1.00)
GFR, Estimated: 35 mL/min — ABNORMAL LOW (ref >60)
Glucose, Bld: 118 mg/dL — ABNORMAL HIGH (ref 70–99)
Potassium: 3.9 mmol/L (ref 3.5–5.1)
Sodium: 139 mmol/L (ref 135–145)

## 2023-03-04 LAB — CBC
HCT: 41.6 % (ref 36.0–46.0)
Hemoglobin: 12.8 g/dL (ref 12.0–15.0)
MCH: 27.9 pg (ref 26.0–34.0)
MCHC: 30.8 g/dL (ref 30.0–36.0)
MCV: 90.8 fL (ref 80.0–100.0)
Platelets: 185 10*3/uL (ref 150–400)
RBC: 4.58 MIL/uL (ref 3.87–5.11)
RDW: 15.1 % (ref 11.5–15.5)
WBC: 7.3 10*3/uL (ref 4.0–10.5)
nRBC: 0 % (ref 0.0–0.2)

## 2023-03-04 LAB — RESP PANEL BY RT-PCR (RSV, FLU A&B, COVID)  RVPGX2
Influenza A by PCR: NEGATIVE
Influenza B by PCR: NEGATIVE
Resp Syncytial Virus by PCR: NEGATIVE
SARS Coronavirus 2 by RT PCR: NEGATIVE

## 2023-03-04 LAB — URINALYSIS, ROUTINE W REFLEX MICROSCOPIC
Bilirubin Urine: NEGATIVE
Glucose, UA: >=500 mg/dL — AB
Hgb urine dipstick: NEGATIVE
Ketones, ur: NEGATIVE mg/dL
Leukocytes,Ua: NEGATIVE
Nitrite: NEGATIVE
Protein, ur: 30 mg/dL — AB
Specific Gravity, Urine: 1.015 (ref 1.005–1.030)
pH: 5 (ref 5.0–8.0)

## 2023-03-04 LAB — CBG MONITORING, ED: Glucose-Capillary: 110 mg/dL — ABNORMAL HIGH (ref 70–99)

## 2023-03-04 LAB — HEPATIC FUNCTION PANEL
ALT: 25 U/L (ref 0–44)
AST: 27 U/L (ref 15–41)
Albumin: 4 g/dL (ref 3.5–5.0)
Alkaline Phosphatase: 56 U/L (ref 38–126)
Bilirubin, Direct: 0.2 mg/dL (ref 0.0–0.2)
Indirect Bilirubin: 0.3 mg/dL (ref 0.3–0.9)
Total Bilirubin: 0.5 mg/dL (ref <1.2)
Total Protein: 7.1 g/dL (ref 6.5–8.1)

## 2023-03-04 LAB — TROPONIN I (HIGH SENSITIVITY)
Troponin I (High Sensitivity): 10 ng/L (ref <18)
Troponin I (High Sensitivity): 10 ng/L (ref <18)

## 2023-03-04 LAB — AMMONIA: Ammonia: 13 umol/L (ref 9–35)

## 2023-03-04 IMAGING — DX DG CHEST 1V PORT
1 series · 1 of 1 positions shown · non-contrast
Comparison: Chest x-ray 02/21/2021.

CLINICAL DATA: 75-year-old female with history of shortness of
breath.

EXAM:
PORTABLE CHEST 1 VIEW

[chest]
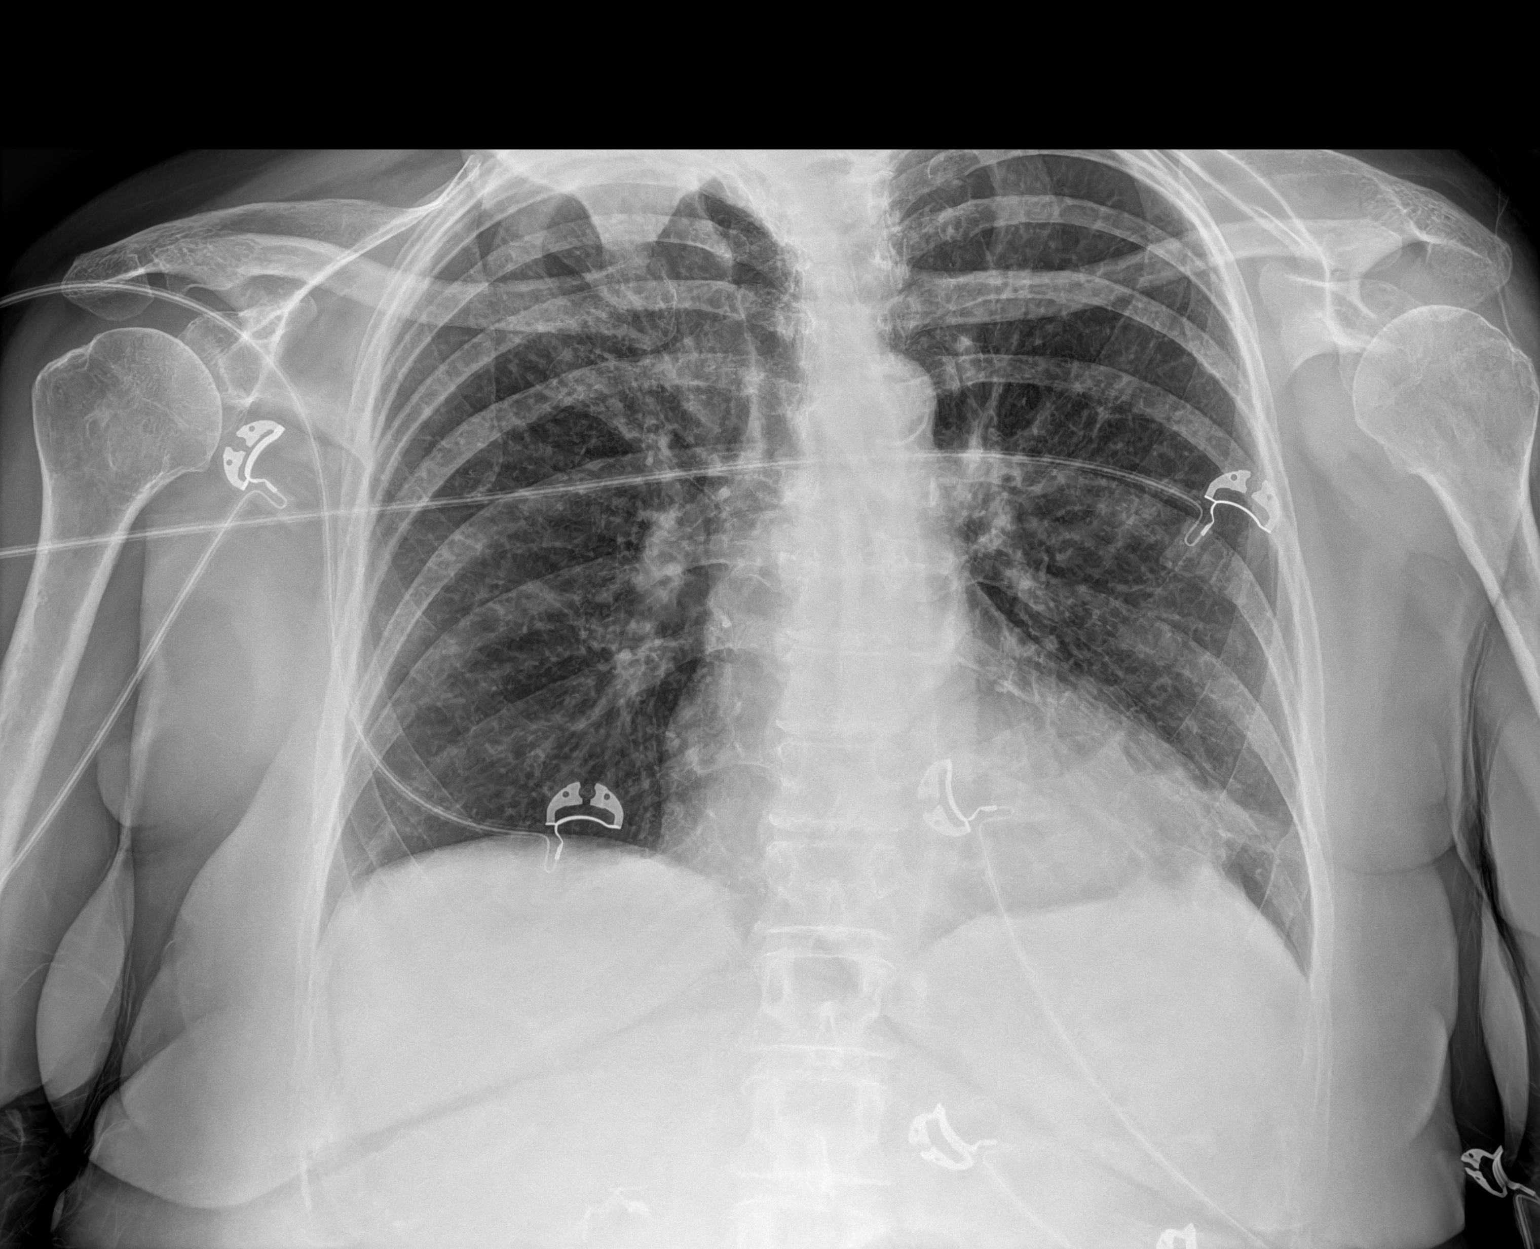

[1 of 1 positions shown; findings below may reference images not displayed]

FINDINGS: Mild chronic scarring in the left lung base. Lung volumes are
normal. No acute consolidative airspace disease. No pleural
effusions. No suspicious appearing pulmonary nodules or masses are
noted. No evidence of pulmonary edema. Heart size is normal. Upper
mediastinal contours are within normal limits. Atherosclerotic
calcifications are noted in the thoracic aorta.
IMPRESSION: 1. No radiographic evidence of acute cardiopulmonary disease.
2. Aortic atherosclerosis.

## 2023-03-04 MED ORDER — ACETAMINOPHEN 325 MG PO TABS
650.0000 mg | ORAL_TABLET | Freq: Once | ORAL | Status: AC
Start: 2023-03-04 — End: 2023-03-04
  Administered 2023-03-04: 650 mg via ORAL
  Filled 2023-03-04: qty 2

## 2023-03-04 NOTE — ED Notes (Signed)
Pt given 8oz orange juice w/ EDP permission.

## 2023-03-04 NOTE — ED Triage Notes (Signed)
Pt in from home via [facility], per report pt family reports generalized and anorexia for several days, the pts home health RN came to the house and called EMS d/t low sats of 78% with crackles in the lower right lobe, pt reported to be 93% on RA with EMS, denies complaints generalized weakness and occasional dizziness, A&O x4, hx of CVA x 2 mths with L sided deficits at baseline

## 2023-03-04 NOTE — ED Notes (Signed)
Pt's husband reports Pt is starting to get a headache and has not had any of her home medications.  EDP made aware.

## 2023-03-04 NOTE — Discharge Instructions (Signed)
As discussed, workup today overall reassuring.  No evidence of stroke, infectious etiology as cause of symptoms.  Recommend close follow-up with your primary care provider for reassessment of your symptoms.  Please do not hesitate if the worrisome signs and symptoms we discussed become apparent or if you are not able to take care of patient at home.

## 2023-03-04 NOTE — ED Provider Notes (Signed)
[place] EMERGENCY DEPARTMENT AT [facility] Provider Note   CSN: [id number] Arrival date & time: 03/04/23  1042     History  Chief Complaint  Patient presents with   Back Pain   Weakness    Melinda Gomez is a 77 y.o. female.  HPI   77 year old female presents emergency department accompanied by daughter who is primary historian.  Daughter states that patient woke up this morning and her husband was trying to get the patient out of bed when the patient collapsed to the floor.  Was let down gently with no traumatic injury reported.  Per daughter, patient has had decreased p.o. intake over the past several days.  After incident occurred this morning, they called home health nurse and patient's oxygen saturation was reportedly 78% on room air.  Denies history of O2 use at home.  Per daughter, patient has been slightly more confused this morning compared to yesterday.  Denies any fever, abdominal pain, nausea, vomiting, urinary symptoms, change in bowel habits.  Patient's daughter does state that the patient has been with cough for the past 3 to 5 days.  Past medical history significant for COPD, CAD, GERD, GAD, CHF, hypertension, hypercholesterolemia, CVA with left-sided deficits.  Home Medications Prior to Admission medications   Medication Sig Start Date End Date Taking? Authorizing Provider  acetaminophen (TYLENOL) 500 MG tablet Take 2 tablets (1,000 mg total) by mouth every 8 (eight) hours as needed for fever, headache or mild pain (pain score 1-3). 01/30/23 01/30/24  Vassie Loll, MD  albuterol (VENTOLIN HFA) 108 (90 Base) MCG/ACT inhaler Inhale 2 puffs into the lungs every 4 (four) hours as needed for shortness of breath or wheezing. 10/03/22   Shon Hale, MD  ALPRAZolam Prudy Feeler) 0.5 MG tablet Take 1 tablet (0.5 mg total) by mouth 3 (three) times daily as needed for anxiety. 01/30/23   Vassie Loll, MD  aspirin EC 81 MG tablet Take 1 tablet (81 mg total) by mouth  daily. Swallow whole. 01/31/23   Vassie Loll, MD  Budeson-Glycopyrrol-Formoterol (BREZTRI AEROSPHERE) 160-9-4.8 MCG/ACT AERO Inhale 2 puffs into the lungs in the morning and at bedtime. 11/07/22   Luciano Cutter, MD  cetirizine (ZYRTEC) 10 MG tablet Take 10 mg by mouth daily.    [provider]  cholestyramine Lanetta Inch) 4 GM/DOSE powder Take 4 g by mouth See admin instructions. 13 grams added to 2-6 oz of water as needed    [provider]  clopidogrel (PLAVIX) 75 MG tablet Take 1 tablet (75 mg total) by mouth daily. 01/31/23   Vassie Loll, MD  dapagliflozin propanediol (FARXIGA) 10 MG TABS tablet Take 1 tablet (10 mg total) by mouth daily before breakfast. 05/27/22   O'Neal, Ronnald Ramp, MD  metoprolol succinate (TOPROL-XL) 50 MG 24 hr tablet Take 1 tablet (50 mg total) by mouth daily for heart and blood pressure. Take with or immediately following a meal. 11/26/21   O'Neal, Ronnald Ramp, MD  promethazine (PHENERGAN) 12.5 MG tablet Take 12.5 mg by mouth every 6 (six) hours as needed for nausea.    [provider]  rizatriptan (MAXALT) 5 MG tablet Take 5 mg by mouth as needed for migraine. 09/24/22   [provider]  rosuvastatin (CRESTOR) 20 MG tablet Take 20 mg by mouth daily.    [provider]  sacubitril-valsartan (ENTRESTO) 24-26 MG Take 1 tablet by mouth 2 (two) times daily. 10/24/22   O'NealRonnald Ramp, MD  spironolactone (ALDACTONE) 25 MG tablet TAKE  1/2 TABLET BY MOUTH DAILY FOR FLUID. Patient taking differently: Take 12.5 mg by mouth daily. 04/18/22   Sande Rives, MD  traZODone (DESYREL) 100 MG tablet Take 1 tablet (100 mg total) by mouth at bedtime as needed for sleep. TAKE 1 TO2 TABLETS AT BEDTIME AS NEEDED FOR SLEEP. USE THE LOWEST DOSE THAT WORKS. 01/30/23   Vassie Loll, MD  venlafaxine XR (EFFEXOR-XR) 75 MG 24 hr capsule TAKE 1 CAPSULE DURING THE DAY AND 2 CAPSULES AT BEDTIME FOR DEPRESSION. 02/10/23   Cottle, Steva Ready., MD       Allergies    Hydrochlorothiazide, Lisinopril, Norpramin [desipramine], Other, Talwin [pentazocine], and Verapamil    Review of Systems   Review of Systems  All other systems reviewed and are negative.   Physical Exam Updated Vital Signs BP 112/63 (BP Location: Right Arm)   Pulse 76   Temp 97.6 F (36.4 C) (Oral)   Resp 18   Ht 5\' 5"  (1.651 m)   Wt 63.5 kg   SpO2 92%   BMI 23.30 kg/m  Physical Exam Vitals and nursing note reviewed.  Constitutional:      General: She is not in acute distress.    Appearance: She is well-developed.  HENT:     Head: Normocephalic and atraumatic.  Eyes:     Conjunctiva/sclera: Conjunctivae normal.  Cardiovascular:     Rate and Rhythm: Normal rate and regular rhythm.     Heart sounds: No murmur heard. Pulmonary:     Effort: Pulmonary effort is normal. No respiratory distress.     Breath sounds: Normal breath sounds.  Abdominal:     Palpations: Abdomen is soft.     Tenderness: There is no abdominal tenderness.  Musculoskeletal:        General: No swelling.     Cervical back: Neck supple.     Comments: No midline tenderness of cervical, thoracic spine.  Skin:    General: Skin is warm and dry.     Capillary Refill: Capillary refill takes less than 2 seconds.  Neurological:     Mental Status: She is alert.     Comments: Alert and oriented to self, place, and event.   Speech is fluent, clear without dysarthria or dysphasia.   Strength symmetric in upper/lower extremities   Sensation intact in upper/lower extremities   Patient able to ambulate with walker independently.   CN I not tested  CN II not tested  CN III, IV, VI PERRLA and EOMs intact bilaterally  CN V Intact sensation to sharp and light touch to the face  CN VII facial movements symmetric  CN VIII not tested  CN IX, X no uvula deviation, symmetric rise of soft palate  CN XI symmetric SCM and trapezius strength bilaterally  CN XII Midline tongue protrusion, symmetric  L/R movements     Psychiatric:        Mood and Affect: Mood normal.     ED Results / Procedures / Treatments   Labs (all labs ordered are listed, but only abnormal results are displayed) Labs Reviewed  BASIC METABOLIC PANEL - Abnormal; Notable for the following components:      Result Value   CO2 18 (*)    Glucose, Bld 118 (*)    Creatinine, Ser 1.52 (*)    GFR, Estimated 35 (*)    All other components within normal limits  URINALYSIS, ROUTINE W REFLEX MICROSCOPIC - Abnormal; Notable for the following components:   APPearance HAZY (*)  Glucose, UA >=500 (*)    Protein, ur 30 (*)    Bacteria, UA RARE (*)    All other components within normal limits  CBG MONITORING, ED - Abnormal; Notable for the following components:   Glucose-Capillary 110 (*)    All other components within normal limits  RESP PANEL BY RT-PCR (RSV, FLU A&B, COVID)  RVPGX2  CBC  AMMONIA  HEPATIC FUNCTION PANEL  TROPONIN I (HIGH SENSITIVITY)  TROPONIN I (HIGH SENSITIVITY)    EKG EKG Interpretation Date/Time:  Tuesday March 04 2023 11:21:14 EST Ventricular Rate:  78 PR Interval:  214 QRS Duration:  156 QT Interval:  454 QTC Calculation: 517 R Axis:   19  Text Interpretation: Sinus rhythm with 1st degree A-V block Left bundle branch block Confirmed by Vivi Barrack [phone]) on 03/04/2023 12:07:04 PM  Radiology DG Chest 2 View  Result Date: 03/04/2023 CLINICAL DATA:  Weakness.  Hypoxia. EXAM: CHEST - 2 VIEW COMPARISON:  Chest x-ray dated January 26, 2023. FINDINGS: The heart size and mediastinal contours are within normal limits. Similar appearance of the left costophrenic angle due to prominent pericardial fat pad. Chronic interstitial coarsening, similar to prior study. No focal consolidation, pleural effusion, or pneumothorax. No acute osseous abnormality. IMPRESSION: 1. No acute cardiopulmonary disease. Electronically Signed   By: Obie Dredge M.D.   On: 03/04/2023 14:33   CT Lumbar Spine  Wo Contrast  Result Date: 03/04/2023 CLINICAL DATA:  Generalized weakness and anorexia. EXAM: CT LUMBAR SPINE WITHOUT CONTRAST TECHNIQUE: Multidetector CT imaging of the lumbar spine was performed without intravenous contrast administration. Multiplanar CT image reconstructions were also generated. RADIATION DOSE REDUCTION: This exam was performed according to the departmental dose-optimization program which includes automated exposure control, adjustment of the mA and/or kV according to patient size and/or use of iterative reconstruction technique. COMPARISON:  None Available. FINDINGS: Segmentation: 5 lumbar type vertebral bodies. Alignment: Normal Vertebrae: No fracture or focal bone lesion. Paraspinal and other soft tissues: No significant finding. Ordinary atherosclerosis of the aorta and its branch vessels. Disc levels: No significant disc level pathology. No sign of disc herniation or spinal stenosis. Ordinary mild facet osteoarthritis in the lower lumbar spine but without slippage or encroachment upon the neural spaces. IMPRESSION: 1. No acute or traumatic finding. No evidence of significant stenosis or neural compression. Ordinary mild facet osteoarthritis in the lower lumbar spine but without slippage or encroachment upon the neural spaces. Aortic Atherosclerosis (ICD10-I70.0). Electronically Signed   By: Paulina Fusi M.D.   On: 03/04/2023 13:18   CT Head Wo Contrast  Result Date: 03/04/2023 CLINICAL DATA:  Mental status change of unknown cause. Weakness and loss of appetite over the last several days. EXAM: CT HEAD WITHOUT CONTRAST TECHNIQUE: Contiguous axial images were obtained from the base of the skull through the vertex without intravenous contrast. RADIATION DOSE REDUCTION: This exam was performed according to the departmental dose-optimization program which includes automated exposure control, adjustment of the mA and/or kV according to patient size and/or use of iterative reconstruction  technique. COMPARISON:  CT and MR studies 01/23/2023 FINDINGS: Brain: No focal abnormality seen affecting the brainstem or cerebellum. Left cerebral hemisphere shows chronic small-vessel ischemic changes within the white matter but no evidence of recent or large vessel territory stroke. Right hemisphere shows widespread chronic small-vessel ischemic changes and subacute infarction in the right middle cerebral artery territory affecting the temporal lobe, temporoparietal junction region and basal ganglia. This was acute in late October. No evidence of mass effect or  shift. No evidence of hemorrhagic transformation. Vascular: There is atherosclerotic calcification of the major vessels at the base of the brain. Skull: Negative Sinuses/Orbits: Clear other than chronic opacification of the left division of the sphenoid sinus. Orbits negative. Other: None IMPRESSION: 1. Subacute infarction in the right middle cerebral artery territory affecting the temporal lobe, temporoparietal junction region and basal ganglia. This was acute in late October. No evidence of mass effect or shift. No evidence of hemorrhagic transformation. No evidence of gross extension. 2. Widespread chronic small-vessel ischemic changes elsewhere in the brain. 3. Chronic inflammatory changes of the left division of the sphenoid sinus. Electronically Signed   By: Paulina Fusi M.D.   On: 03/04/2023 13:16    Procedures Procedures    Medications Ordered in ED Medications  acetaminophen (TYLENOL) tablet 650 mg (650 mg Oral Given 03/04/23 1509)    ED Course/ Medical Decision Making/ A&P                                 Medical Decision Making Amount and/or Complexity of Data Reviewed Labs: ordered. Radiology: ordered.  Risk OTC drugs.   This patient presents to the ED for concern of weakness, this involves an extensive number of treatment options, and is a complaint that carries with it a high risk of complications and morbidity.  The  differential diagnosis includes sepsis, pneumonia, UTI, CVA, cauda equina/valvular abscess, metabolic derangement, other   Co morbidities that complicate the patient evaluation  See HPI   Additional history obtained:  Additional history obtained from EMR External records from outside source obtained and reviewed including hospital records   Lab Tests:  I Ordered, and personally interpreted labs.  The pertinent results include: No leukocytosis.  No evidence of anemia.  Placed within range.  Mild decrease in bicarb otherwise, electrolytes within normal limits.  Patient with baseline renal dysfunction with creatinine of 1.52, GFR 35 BUN of 18.  Respiratory viral panel negative.  UA without obvious infection.  Troponin of 10.  Ammonia of 13.   Imaging Studies ordered:  I ordered imaging studies including chest x-ray, CT head, CT lumbar spine I independently visualized and interpreted imaging which showed  Chest x-ray: No acute cardiopulmonary abnormality. CT head: Subacute infarction of right middle cerebral artery.  No evidence of mass effect.  No evidence of hemorrhagic transformation.  Widespread chronic small vessel ischemic changes. CT lumbar spine: No acute traumatic finding. I agree with the radiologist interpretation  Cardiac Monitoring: / EKG:  The patient was maintained on a cardiac monitor.  I personally viewed and interpreted the cardiac monitored which showed an underlying rhythm of: Sinus rhythm with left bundle wrench block.  First-degree AV block.   Consultations Obtained:  I requested consultation with attending physician Dr. Jarold Motto independently evaluated patient was agreement with treatment plan going forward.   Problem List / ED Course / Critical interventions / Medication management  Difficulty ambulating I ordered medication including Tylenol   Reevaluation of the patient after these medicines showed that the patient improved I have reviewed the  patients home medicines and have made adjustments as needed   Social Determinants of Health:  Former cigarette use.  Denies illicit drug use.   Test / Admission - Considered:  Difficulty ambulating Vitals signs within normal range and stable throughout visit. Laboratory/imaging studies significant for: See above 77 year old female presents emergency department accompanied by daughter as well as husband of an episode  occurred today when patient was trying to get out of bed was unable to stand and subsequently was gently let down on the floor without traumatic injury.  Per more detailed husband's history, he has been having difficulty getting patient out of bed and walking in the morning for several weeks now with similar occurrence this morning.  Patient was reportedly found to be hypoxic at home with oxygen saturations of 78% but has been with normal oxygen saturations on room air for 9+ hours while in the ED.  On exam, no focal weakness of lower extremities labs reassuring.  Imaging studies reassuring.  Patient was subsequently fed and then ambulated independently with walker as at baseline per daughter and husband.  Patient seems to have difficulty in beginning ambulation.  Will stand at the bedside with walker in front of her for 30 seconds to a minute before attempting to walk.  She would then sit down and then attempt again.  When she actually began walking, walked 20 to 30 yards before turning around and sitting back down in the bed as at baseline per family members.  Low suspicion for CVA.  Low suspicion for cauda equina, spinal epidural abscess, other spinal cord compression/impingement.  Low suspicion for infectious etiology or metabolic derangement as cause of patient's symptoms.  Offered patient and family to see consultation for placement for physical therapy given progressively worsening difficulty in ambulating the patient and family declined due to reassuring workup.  They will attempt to  continue to have at home physical therapy, home health evaluations and follow-up with primary care.  Treatment plan discussed at length with patient and family and they acknowledge understanding were agreeable to said plan.  Patient overall well-appearing, afebrile in no acute distress upon discharge. Worrisome signs and symptoms were discussed with the patient/family, and they acknowledged understanding to return to the ED if noticed. Patient was stable upon discharge.          Final Clinical Impression(s) / ED Diagnoses Final diagnoses:  Walking troubles    Rx / DC Orders ED Discharge Orders     None         Peter Garter, [place] 03/05/23 0957    Loetta Rough, MD 03/09/23 [phone]

## 2023-03-04 NOTE — ED Notes (Signed)
Pt c/o back pain "at waistline" w/ walking for several days.  Pt denies n/v/d.  Sts she "just hasn't felt like eating."  Currently, denies pain while laying in bed. Pt reports she fell, but back pain started prior to fall.

## 2023-03-04 NOTE — ED Notes (Signed)
Pt has been able to tolerate orange juice and water.

## 2023-03-04 NOTE — ED Notes (Signed)
Pt able to tolerate apple sauce

## 2023-03-04 NOTE — ED Notes (Signed)
Phlebotomy asked to draw blood d/t IV not giving blood.

## 2023-03-17 ENCOUNTER — Encounter: Payer: Self-pay | Admitting: Podiatry

## 2023-03-17 ENCOUNTER — Ambulatory Visit: Payer: PPO | Admitting: Podiatry

## 2023-03-17 DIAGNOSIS — M79675 Pain in left toe(s): Secondary | ICD-10-CM | POA: Diagnosis not present

## 2023-03-17 DIAGNOSIS — B351 Tinea unguium: Secondary | ICD-10-CM | POA: Diagnosis not present

## 2023-03-17 DIAGNOSIS — M79674 Pain in right toe(s): Secondary | ICD-10-CM | POA: Diagnosis not present

## 2023-03-17 DIAGNOSIS — Z7901 Long term (current) use of anticoagulants: Secondary | ICD-10-CM

## 2023-03-17 NOTE — Progress Notes (Signed)
  Subjective:  Patient ID: Melinda Gomez, female    DOB: 1945/12/24,   MRN: [id number]  No chief complaint on file.   77 y.o. female presents for concern of thickened elongated and painful nails that are difficult to trim. Requesting to have them trimmed today. Patient is on plavix and at risk for foot care  PCP:  Thana Ates, MD    . Denies any other pedal complaints. Denies n/v/f/c.   Past Medical History:  Diagnosis Date   CHF (congestive heart failure) (HCC)    COPD (chronic obstructive pulmonary disease) (HCC)    Coronary artery disease    GAD (generalized anxiety disorder)    GERD (gastroesophageal reflux disease)    Hypercholesteremia    Hypertension     Objective:  Physical Exam: Vascular: DP/PT pulses 2/4 bilateral. CFT <3 seconds. Normal hair growth on digits. No edema.  Skin. No lacerations or abrasions bilateral feet. Nails 1-5 bilateral are thickened elongated and dystrophic with subungual debris.  Musculoskeletal: MMT 5/5 bilateral lower extremities in DF, PF, Inversion and Eversion. Deceased ROM in DF of ankle joint.  Neurological: Sensation intact to light touch.   Assessment:   1. Pain due to onychomycosis of toenails of both feet   2. Chronic anticoagulation      Plan:  Patient was evaluated and treated and all questions answered. -Mechanically debrided all nails 1-5 bilateral using sterile nail nipper and filed with dremel without incident  -Answered all patient questions -Patient to return  in 3 months for at risk foot care -Patient advised to call the office if any problems or questions arise in the meantime.   Louann Sjogren, DPM

## 2023-03-23 NOTE — Progress Notes (Signed)
  Cardiology Office Note:  .   Date:  03/25/2023  ID:  Melinda Gomez, DOB [birth year], MRN [id number] PCP: Melinda Trula SQUIBB, MD   HeartCare Providers Cardiologist:  Darryle ONEIDA Decent, MD {  History of Present Illness: .   Melinda Gomez is a 77 y.o. female with history of CHF, CAD, CVA who presents for follow-up.    History of Present Illness   The patient is a 77 year old female with a history of systolic heart failure, nonobstructive CAD, hyperlipidemia, and diabetes. Recently, the patient was admitted to the hospital due to a right MCA stroke caused R ICA occlusion. The patient has been on home rehab since being discharged from the hospital. The patient has been diligent with her exercise routine, which has helped regain strength. However, the patient has been experiencing dizziness, which has been a cause for concern. The patient's weight has also decreased, which could be a contributing factor to the dizziness. The patient's blood pressure has been noted to be low. The patient is currently on multiple medications, including metoprolol  and Lasix .          Problem List COPD/Tobacco abuse HTN HLD LBBB, QRS 168 ms Systolic HF (non-ischemic) -02/2021: EF 25-30% -09/2021: EF 40-45% -04/2022: EF 50-55% -01/2023: EF 55-60% -MPI infarct 02/23/2021 -LHC 30% RCA (non-obstructive) 6. HLD -T chol 86, HDL 49, LDL 16, TG 104 7. DM -A1c 6.4 8. R MCA CVA 01/23/2023 -R ICA occlusion     ROS: All other ROS reviewed and negative. Pertinent positives noted in the HPI.     Studies Reviewed: Melinda Gomez       Physical Exam:   VS:  BP 108/60 (BP Location: Right Arm, Patient Position: Sitting, Cuff Size: Normal)   Pulse 77   Ht 5' 2 (1.575 m)   Wt 146 lb 3.2 oz (66.3 kg)   SpO2 94%   BMI 26.74 kg/m    Wt Readings from Last 3 Encounters:  03/25/23 146 lb 3.2 oz (66.3 kg)  03/04/23 140 lb (63.5 kg)  12/02/22 157 lb 3.2 oz (71.3 kg)    GEN: Well nourished, well developed in no acute  distress NECK: No JVD; No carotid bruits CARDIAC: RRR, no murmurs, rubs, gallops RESPIRATORY:  Clear to auscultation without rales, wheezing or rhonchi  ABDOMEN: Soft, non-tender, non-distended EXTREMITIES:  No edema; No deformity  ASSESSMENT AND PLAN: .   Assessment and Plan    Recent Right MCA Stroke Due to large vessel occlusion of the right internal carotid artery. No evidence of cardioembolic source. Neurology follow-up scheduled in February. -Continue Aspirin  and Plavix  as per neurology.  Systolic Heart Failure with Recovered EF, EF 55-60% Reports of dizziness and lightheadedness. Weight loss noted. Blood pressure on the lower side. -Reduce Metoprolol  succinate to 25mg  daily. -Change Lasix  to 20mg  daily as needed. -Continue Entresto  24/26mg  BID, Aldactone  12.5mg  daily, and Farxiga  10mg  daily.  Diabetes Well-controlled with most recent A1C of 6.4. -Continue current management.  Hyperlipidemia Well-controlled with LDL of 16. -Continue Crestor  20mg  daily.  Follow-up In 6 months for further discussion and management.              Follow-up: Return in about 6 months (around 09/22/2023).   Signed, Darryle ONEIDA. Decent, MD, [facility]  [facility]  [address], [address] [place], [place] [address] [phone]  12:25 PM

## 2023-03-25 ENCOUNTER — Encounter: Payer: Self-pay | Admitting: Cardiovascular Disease

## 2023-03-25 ENCOUNTER — Ambulatory Visit: Payer: PPO | Attending: Cardiovascular Disease | Admitting: Cardiovascular Disease

## 2023-03-25 VITALS — BP 108/60 | HR 77 | Ht 62.0 in | Wt 146.2 lb

## 2023-03-25 DIAGNOSIS — I2510 Atherosclerotic heart disease of native coronary artery without angina pectoris: Secondary | ICD-10-CM | POA: Diagnosis not present

## 2023-03-25 DIAGNOSIS — I10 Essential (primary) hypertension: Secondary | ICD-10-CM | POA: Diagnosis not present

## 2023-03-25 DIAGNOSIS — E785 Hyperlipidemia, unspecified: Secondary | ICD-10-CM | POA: Diagnosis not present

## 2023-03-25 DIAGNOSIS — I447 Left bundle-branch block, unspecified: Secondary | ICD-10-CM

## 2023-03-25 DIAGNOSIS — I5022 Chronic systolic (congestive) heart failure: Secondary | ICD-10-CM | POA: Diagnosis not present

## 2023-03-25 DIAGNOSIS — I639 Cerebral infarction, unspecified: Secondary | ICD-10-CM

## 2023-03-25 MED ORDER — FUROSEMIDE 20 MG PO TABS: ORAL_TABLET | ORAL | 3 refills | Status: AC

## 2023-03-25 MED ORDER — METOPROLOL SUCCINATE ER 25 MG PO TB24: 25.0000 mg | ORAL_TABLET | Freq: Every day | ORAL | 3 refills | Status: AC

## 2023-03-25 NOTE — Patient Instructions (Addendum)
 Medication Instructions:  - Start Metoprolol  Succinate 25mg  daily  - Start LASIX  20MG  as needed for weight gain greater than 3lb overnight or 5lb in 1 week.    *If you need a refill on your cardiac medications before your next appointment, please call your pharmacy*   Lab Work: None    If you have labs (blood work) drawn today and your tests are completely normal, you will receive your results only by: MyChart Message (if you have MyChart) OR A paper copy in the mail If you have any lab test that is abnormal or we need to change your treatment, we will call you to review the results.   Testing/Procedures: None    Follow-Up: At [facility], you and your health needs are our priority.  As part of our continuing mission to provide you with exceptional heart care, we have created designated Provider Care Teams.  These Care Teams include your primary Cardiologist (physician) and Advanced Practice Providers (APPs -  Physician Assistants and Nurse Practitioners) who all work together to provide you with the care you need, when you need it.  We recommend signing up for the patient portal called MyChart.  Sign up information is provided on this After Visit Summary.  MyChart is used to connect with patients for Virtual Visits (Telemedicine).  Patients are able to view lab/test results, encounter notes, upcoming appointments, etc.  Non-urgent messages can be sent to your provider as well.   To learn more about what you can do with MyChart, go to [email or web address].    Your next appointment:   6 month(s)  The format for your next appointment:   In Person  Provider:   Darryle ONEIDA Decent, MD    Other Instructions

## 2023-04-15 ENCOUNTER — Other Ambulatory Visit: Payer: Self-pay

## 2023-04-15 DIAGNOSIS — F5105 Insomnia due to other mental disorder: Secondary | ICD-10-CM

## 2023-04-15 DIAGNOSIS — G4720 Circadian rhythm sleep disorder, unspecified type: Secondary | ICD-10-CM

## 2023-04-15 MED ORDER — TRAZODONE HCL 100 MG PO TABS: 100.0000 mg | ORAL_TABLET | Freq: Every evening | ORAL | 3 refills | Status: DC | PRN

## 2023-04-21 ENCOUNTER — Telehealth: Payer: Self-pay | Admitting: Cardiovascular Disease

## 2023-04-21 ENCOUNTER — Encounter: Payer: Self-pay | Admitting: Cardiovascular Disease

## 2023-04-21 NOTE — Telephone Encounter (Signed)
Pt c/o medication issue:  1. Name of Medication: sacubitril-valsartan (ENTRESTO) 24-26 MG   2. How are you currently taking this medication (dosage and times per day)?   3. Are you having a reaction (difficulty breathing--STAT)?   4. What is your medication issue? Patient's daughter left mychart message, but requesting this be sent to triage since they need Entresto form faxed today for [employer]. Requesting call back to get update once faxed.

## 2023-04-21 NOTE — Telephone Encounter (Signed)
Patient identification verified by 2 forms. Shade Flood, RN  DPR list daugter Jeanice Lim    Reviewed ED warning signs/precautions  Patient agrees with plan, no questions at this time Message received after provider left clinic for today. Informed Efraim Kaufmann I will get Dr. Flora Lipps to fill out provider portion of PA form tomorrow (1/28)afternoon. [name] verbalized understanding. No further questions at this time.

## 2023-04-22 ENCOUNTER — Telehealth: Payer: Self-pay

## 2023-04-22 NOTE — Telephone Encounter (Signed)
Pt's pharmacy is requesting a refill on clopidogrel. This medication was prescribed in the hospital. Would Dr. Flora Lipps like to refill this medication? Please address

## 2023-04-24 MED ORDER — CLOPIDOGREL BISULFATE 75 MG PO TABS: 75.0000 mg | ORAL_TABLET | Freq: Every day | ORAL | 1 refills | Status: DC

## 2023-04-24 NOTE — Telephone Encounter (Signed)
Pt's medication clopidogrel was sent to pt's pharmacy for a 30 day supply with 1 refill asking pt to contact neurologist going forward for refills. Confirmation received.

## 2023-05-12 ENCOUNTER — Ambulatory Visit: Payer: HMO | Admitting: Neurology

## 2023-05-12 VITALS — BP 108/52 | HR 73 | Ht 62.0 in | Wt 144.6 lb

## 2023-05-12 DIAGNOSIS — R413 Other amnesia: Secondary | ICD-10-CM | POA: Diagnosis not present

## 2023-05-12 DIAGNOSIS — F0150 Vascular dementia without behavioral disturbance: Secondary | ICD-10-CM

## 2023-05-12 DIAGNOSIS — I63411 Cerebral infarction due to embolism of right middle cerebral artery: Secondary | ICD-10-CM | POA: Diagnosis not present

## 2023-05-12 DIAGNOSIS — I6521 Occlusion and stenosis of right carotid artery: Secondary | ICD-10-CM | POA: Diagnosis not present

## 2023-05-12 NOTE — Progress Notes (Signed)
[facility] [address] [place]. [place] [address] [phone]       OFFICE CONSULT NOTE  Melinda Gomez Date of Birth:  07-15-1945 Medical Record Number:  [id number]   Referring MD: Lindie Spruce  Reason for Referral: Stroke  HPI: Melinda Gomez is a 78 year old pleasant Caucasian lady seen today for initial office consultation visit for stroke.  She is accompanied by her husband and daughter who provide most of the history.  And I have also reviewed hospital electronic medical records as well as pertinent imaging films in PACS. She has past medical history of systolic heart failure, nonobstructive coronary artery disease, hyperlipidemia, diabetes, gastroesophageal reflux disease and COPD.  At baseline at home she was needing help with ADLs 24/7 and using a walker even inside the house and needing help with bathing and toileting etc.  She presented on 01/23/2023 with sudden onset of not acting normally with left-sided weakness upon awakening from a nap that she took at 12 PM after lunch.  EMS was called and took the patient to [facility] where CT scan of the head on admission showed a hyperdense right MCA sign and CT angio and perfusion showed a right carotid and right M1 occlusion with 58 cc of ischemic core and 101 cc of mismatch penumbra.  Given patient's poor functional status at baseline and large stroke  core even though she had a favorable penumbra she was not felt to be a candidate for mechanical thrombectomy and this was discussed with her daughter and husband who agreed not to pursue mechanical thrombectomy due to increased complication risk and instead she was admitted for workup at [facility].  NIH stroke scale was 15 on admission.  Transthoracic echo showed ejection fraction 55 to 60%.  Patient was started on dual antiplatelet therapy aspirin and Plavix for 3 months and discharged home with home therapies.  Patient's husband and daughter feels  she has made slight progress.  She is still bedridden and not able to walk unassisted.  She can use her rollator and walk a few steps but family has to be closely around.  She still needs help with activities of daily living like bathing and cleaning herself and using the restroom.  The family is noticed that she has had short-term memory difficulties and has poor stamina.  She needs constant reminders to exercise.  She gets confused and disoriented quite easily.  Of late she has been also having some hallucinations and seeing people who were not there.  She does have a family history of Alzheimer's and family is worried about Alzheimer's risk.  She has had no recurrent stroke or TIA symptoms.  She is tolerating aspirin and Plavix well with minor bruising but no bleeding.  She continues to have significant left-sided peripheral vision loss but left-sided weakness appears to have improved. ROS:   14 system review of systems is positive for memory loss, confusion, disorientation, hallucinations, vision loss, weakness, inability to walk and all other systems negative  PMH:  Past Medical History:  Diagnosis Date   CHF (congestive heart failure) (HCC)    COPD (chronic obstructive pulmonary disease) (HCC)    Coronary artery disease    GAD (generalized anxiety disorder)    GERD (gastroesophageal reflux disease)    Hypercholesteremia    Hypertension     Social History:  Social History   Socioeconomic History   Marital status: Married    Spouse name: Not on file   Number of  children: 2   Years of education: Not on file   Highest education level: Not on file  Occupational History   Occupation: Retired - IT consultant  Tobacco Use   Smoking status: Former    Current packs/day: 0.00    Average packs/day: 0.5 packs/day for 55.0 years (27.5 ttl pk-yrs)    Types: Cigarettes    Start date: 02/1966    Quit date: 02/2021    Years since quitting: 2.2   Smokeless tobacco: Never  Substance  and Sexual Activity   Alcohol use: Never   Drug use: Never   Sexual activity: Not on file  Other Topics Concern   Not on file  Social History Narrative   Not on file   Social Drivers of Health   Financial Resource Strain: Not on file  Food Insecurity: No Food Insecurity (01/31/2023)   Hunger Vital Sign    Worried About Running Out of Food in the Last Year: Never true    Ran Out of Food in the Last Year: Never true  Transportation Needs: No Transportation Needs (01/31/2023)   PRAPARE - Administrator, Civil Service (Medical): No    Lack of Transportation (Non-Medical): No  Physical Activity: Not on file  Stress: Not on file  Social Connections: Not on file  Intimate Partner Violence: Not At Risk (01/31/2023)   Humiliation, Afraid, Rape, and Kick questionnaire    Fear of Current or Ex-Partner: No    Emotionally Abused: No    Physically Abused: No    Sexually Abused: No    Medications:   Current Outpatient Medications on File Prior to Visit  Medication Sig Dispense Refill   acetaminophen (TYLENOL) 500 MG tablet Take 2 tablets (1,000 mg total) by mouth every 8 (eight) hours as needed for fever, headache or mild pain (pain score 1-3).     albuterol (VENTOLIN HFA) 108 (90 Base) MCG/ACT inhaler Inhale 2 puffs into the lungs every 4 (four) hours as needed for shortness of breath or wheezing. 18 g 2   ALPRAZolam (XANAX) 0.5 MG tablet Take 1 tablet (0.5 mg total) by mouth 3 (three) times daily as needed for anxiety. 15 tablet 0   aspirin EC 81 MG tablet Take 1 tablet (81 mg total) by mouth daily. Swallow whole.     Budeson-Glycopyrrol-Formoterol (BREZTRI AEROSPHERE) 160-9-4.8 MCG/ACT AERO Inhale 2 puffs into the lungs in the morning and at bedtime. 10.7 g 11   cetirizine (ZYRTEC) 10 MG tablet Take 10 mg by mouth daily.     cholestyramine (QUESTRAN) 4 GM/DOSE powder Take 4 g by mouth See admin instructions. 13 grams added to 2-6 oz of water as needed     clopidogrel (PLAVIX) 75  MG tablet Take 1 tablet (75 mg total) by mouth daily. 30 tablet 1   cyclobenzaprine (FLEXERIL) 10 MG tablet Take 10 mg by mouth daily as needed for muscle spasms.     dapagliflozin propanediol (FARXIGA) 10 MG TABS tablet Take 1 tablet (10 mg total) by mouth daily before breakfast. 90 tablet 3   furosemide (LASIX) 20 MG tablet Take 1 tablet (20mg ) as needed for weight gain greater than 3lb overnight or 5lb in 1 week. 90 tablet 3   metoprolol succinate (TOPROL-XL) 25 MG 24 hr tablet Take 1 tablet (25 mg total) by mouth daily. 90 tablet 3   promethazine (PHENERGAN) 12.5 MG tablet Take 12.5 mg by mouth every 6 (six) hours as needed for nausea.     risperiDONE (  RISPERDAL) 1 MG tablet Take 1 mg by mouth at bedtime.     rizatriptan (MAXALT) 5 MG tablet Take 5 mg by mouth as needed for migraine.     rosuvastatin (CRESTOR) 20 MG tablet Take 20 mg by mouth daily.     sacubitril-valsartan (ENTRESTO) 24-26 MG Take 1 tablet by mouth 2 (two) times daily. 180 tablet 3   spironolactone (ALDACTONE) 25 MG tablet TAKE 1/2 TABLET BY MOUTH DAILY FOR FLUID. (Patient taking differently: Take 12.5 mg by mouth daily.) 45 tablet 2   traZODone (DESYREL) 100 MG tablet Take 1 tablet (100 mg total) by mouth at bedtime as needed for sleep. TAKE 1 TO2 TABLETS AT BEDTIME AS NEEDED FOR SLEEP. USE THE LOWEST DOSE THAT WORKS. (Patient taking differently: Take 200 mg by mouth at bedtime as needed for sleep. TAKE 1 TO2 TABLETS AT BEDTIME AS NEEDED FOR SLEEP. USE THE LOWEST DOSE THAT WORKS.) 30 tablet 3   venlafaxine XR (EFFEXOR-XR) 75 MG 24 hr capsule TAKE 1 CAPSULE DURING THE DAY AND 2 CAPSULES AT BEDTIME FOR DEPRESSION. 90 capsule 5   No current facility-administered medications on file prior to visit.    Allergies:   Allergies  Allergen Reactions   Hydrochlorothiazide Other (See Comments)    hyponatremia   Lisinopril Cough   Norpramin [Desipramine] Other (See Comments)    Jittery   Other Other (See Comments)    Estratest   depression & palpitation   Talwin [Pentazocine] Other (See Comments)    hallucinations   Verapamil Cough    Physical Exam General: Mildly obese elderly Caucasian lady, seated, in no evident distress Head: head normocephalic and atraumatic.   Neck: supple with no carotid or supraclavicular bruits Cardiovascular: regular rate and rhythm, no murmurs Musculoskeletal: no deformity Skin:  no rash/petichiae Vascular:  Normal pulses all extremities  Neurologic Exam Mental Status: Awake and fully alert. Oriented to place but not time.  Recent and remote memory poor attention span, concentration and fund of knowledge diminished. Mood and affect appropriate.  Recall 0/3.  Unable to draw clock Cranial Nerves: Fundoscopic exam reveals sharp disc margins. Pupils equal, briskly reactive to light. Extraocular movements full without nystagmus with slight right gaze preference. Visual fields show dense homonymous hemianopsia on the left to confrontation. Hearing intact. Facial sensation intact. Face, tongue, palate moves normally and symmetrically.  Motor: Normal bulk and tone. Normal strength in all tested extremity muscles.  Except diminished fine finger movements on the left and orbits right over left upper extremity.  Left grip weakness.  Slight left lower extremity drift.  Tone is slightly increased on the left compared to the right. Sensory.: intact to touch , pinprick , position and vibratory sensation.  Coordination: Rapid alternating movements normal in all extremities. Finger-to-nose and heel-to-shin performed accurately bilaterally. Gait and Station: Not tested as patient is wheelchair-bound Reflexes: 1+ and symmetric. Toes downgoing.   NIHSS  6 Modified Rankin 3-4  IMPRESSION : 78 year old lady with large right MCA infarct and October 2024 secondary to right carotid occlusion with distal embolization to the right middle cerebral artery likely from large vessel disease.  She has significant  residual memory loss and cognitive impairment likely from vascular dementia.  She however does have a family history of Alzheimer's as well.   ASSESSMENT:   PLAN:I had a long d/w patient , her husband and daughter about her recent stroke, carotid occlusion, memory loss and risk for recurrent stroke/TIAs, personally independently reviewed imaging studies and stroke evaluation results and  answered questions.Continue aspirin 81 mg daily alone and discontinue Plavix as it has been more than 3 months since her stroke for secondary stroke prevention and maintain strict control of hypertension with blood pressure goal below 130/90, diabetes with hemoglobin A1c goal below 6.5% and lipids with LDL cholesterol goal below 70 mg/dL. I also advised the patient to eat a healthy diet with plenty of whole grains, cereals, fruits and vegetables, exercise regularly and maintain ideal body weight .recommend 30-day heart monitor for paroxysmal A-fib.  Check dementia panel labs and EEG.  I encouraged her to increase participation in cognitively challenging activities like solving crossword puzzles, playing bridge and sudoku.  Patient does have family history of Alzheimer's and family is concerned about patient developing that.  Showed them that a large stroke could also cause memory difficulties but if this from stroke they are likely to improve over the next several months.  If her cognitive impairment and memory loss gets worse we will try medications like Aricept and Namenda in the future.  We also discussed memory compensation strategies.  Followup in the future with me in 6 months or call earlier if necessary. Greater than 50% time during this 60-minute consultation visit was spent in counseling and coordination of care and discussion about her recent large stroke, memory loss and vascular dementia and his MS risk and answering questions Delia Heady, MD  Note: This document was prepared with digital dictation and  possible smart phrase technology. Any transcriptional errors that result from this process are unintentional.

## 2023-05-12 NOTE — Patient Instructions (Addendum)
I had a long d/w patient , her husband and daughter about her recent stroke, carotid occlusion, memory loss and risk for recurrent stroke/TIAs, personally independently reviewed imaging studies and stroke evaluation results and answered questions.Continue aspirin 81 mg daily alone and discontinue Plavix as it has been more than 3 months since her stroke for secondary stroke prevention and maintain strict control of hypertension with blood pressure goal below 130/90, diabetes with hemoglobin A1c goal below 6.5% and lipids with LDL cholesterol goal below 70 mg/dL. I also advised the patient to eat a healthy diet with plenty of whole grains, cereals, fruits and vegetables, exercise regularly and maintain ideal body weight .recommend 30-day heart monitor for paroxysmal A-fib.  Check dementia panel labs and EEG.  I encouraged her to increase participation in cognitively challenging activities like solving crossword puzzles, playing bridge and sudoku.  We also discussed memory compensation strategies.  Followup in the future with me in 6 months or call earlier if necessary.  Memory Compensation Strategies  Use "WARM" strategy.  W= write it down  A= associate it  R= repeat it  M= make a mental note  2.   You can keep a Glass blower/designer.  Use a 3-ring notebook with sections for the following: calendar, important names and phone numbers,  medications, doctors' names/phone numbers, lists/reminders, and a section to journal what you did  each day.   3.    Use a calendar to write appointments down.  4.    Write yourself a schedule for the day.  This can be placed on the calendar or in a separate section of the Memory Notebook.  Keeping a  regular schedule can help memory.  5.    Use medication organizer with sections for each day or morning/evening pills.  You may need help loading it  6.    Keep a basket, or pegboard by the door.  Place items that you need to take out with you in the basket or on the  pegboard.  You may also want to  include a message board for reminders.  7.    Use sticky notes.  Place sticky notes with reminders in a place where the task is performed.  For example: " turn off the  stove" placed by the stove, "lock the door" placed on the door at eye level, " take your medications" on  the bathroom mirror or by the place where you normally take your medications.  8.    Use alarms/timers.  Use while cooking to remind yourself to check on food or as a reminder to take your medicine, or as a  reminder to make a call, or as a reminder to perform another task, etc.

## 2023-05-26 ENCOUNTER — Other Ambulatory Visit (INDEPENDENT_AMBULATORY_CARE_PROVIDER_SITE_OTHER): Payer: Self-pay

## 2023-05-26 ENCOUNTER — Ambulatory Visit (INDEPENDENT_AMBULATORY_CARE_PROVIDER_SITE_OTHER): Payer: HMO | Admitting: Neurology

## 2023-05-26 DIAGNOSIS — Z0289 Encounter for other administrative examinations: Secondary | ICD-10-CM

## 2023-05-26 DIAGNOSIS — R4182 Altered mental status, unspecified: Secondary | ICD-10-CM | POA: Diagnosis not present

## 2023-05-26 DIAGNOSIS — R413 Other amnesia: Secondary | ICD-10-CM

## 2023-05-27 LAB — DEMENTIA PANEL
Homocysteine: 23.1 umol/L — ABNORMAL HIGH (ref 0.0–19.2)
RPR Ser Ql: NONREACTIVE
TSH: 3.3 u[IU]/mL (ref 0.450–4.500)
Vitamin B-12: 186 pg/mL — ABNORMAL LOW (ref 232–1245)

## 2023-05-30 ENCOUNTER — Encounter: Payer: Self-pay | Admitting: Neurology

## 2023-05-31 ENCOUNTER — Other Ambulatory Visit: Payer: Self-pay | Admitting: Psychiatry

## 2023-05-31 DIAGNOSIS — F5105 Insomnia due to other mental disorder: Secondary | ICD-10-CM

## 2023-05-31 DIAGNOSIS — G4720 Circadian rhythm sleep disorder, unspecified type: Secondary | ICD-10-CM

## 2023-06-03 NOTE — Progress Notes (Signed)
 Kindly inform the patient that lab work for reversible causes of memory loss suggested low vitamin B12 and elevated homocystine which are treatable.  Kindly see primary physician Dr. Margaretann Loveless for B12 replacement and starting folic acid 1 mg daily test for thyroid hormone and syphilis were fine

## 2023-06-03 NOTE — Progress Notes (Signed)
 Kindly inform the patient that EEG of brainwave study was normal.  No seizure activity noted.

## 2023-06-16 ENCOUNTER — Ambulatory Visit (INDEPENDENT_AMBULATORY_CARE_PROVIDER_SITE_OTHER): Payer: PPO | Admitting: Podiatry

## 2023-06-16 DIAGNOSIS — Z91199 Patient's noncompliance with other medical treatment and regimen due to unspecified reason: Secondary | ICD-10-CM

## 2023-06-16 NOTE — Progress Notes (Signed)
 No show

## 2023-06-23 ENCOUNTER — Other Ambulatory Visit: Payer: Self-pay | Admitting: Cardiovascular Disease

## 2023-06-23 ENCOUNTER — Encounter: Payer: Self-pay | Admitting: Cardiovascular Disease

## 2023-07-15 ENCOUNTER — Encounter: Payer: Self-pay | Admitting: Psychiatry

## 2023-07-15 ENCOUNTER — Ambulatory Visit (INDEPENDENT_AMBULATORY_CARE_PROVIDER_SITE_OTHER): Payer: PPO | Admitting: Psychiatry

## 2023-07-15 DIAGNOSIS — F4310 Post-traumatic stress disorder, unspecified: Secondary | ICD-10-CM | POA: Diagnosis not present

## 2023-07-15 DIAGNOSIS — G4720 Circadian rhythm sleep disorder, unspecified type: Secondary | ICD-10-CM

## 2023-07-15 DIAGNOSIS — F331 Major depressive disorder, recurrent, moderate: Secondary | ICD-10-CM | POA: Diagnosis not present

## 2023-07-15 DIAGNOSIS — F22 Delusional disorders: Secondary | ICD-10-CM

## 2023-07-15 DIAGNOSIS — E538 Deficiency of other specified B group vitamins: Secondary | ICD-10-CM | POA: Diagnosis not present

## 2023-07-15 DIAGNOSIS — F5105 Insomnia due to other mental disorder: Secondary | ICD-10-CM | POA: Diagnosis not present

## 2023-07-15 MED ORDER — TRAZODONE HCL 100 MG PO TABS: 150.0000 mg | ORAL_TABLET | Freq: Every day | ORAL | 2 refills | Status: DC

## 2023-07-15 MED ORDER — RISPERIDONE 1 MG PO TABS: 1.0000 mg | ORAL_TABLET | Freq: Every day | ORAL | 1 refills | Status: DC

## 2023-07-15 MED ORDER — VENLAFAXINE HCL ER 75 MG PO CP24: ORAL_CAPSULE | ORAL | 8 refills | Status: DC

## 2023-07-15 NOTE — Progress Notes (Signed)
 Melinda Gomez [id number] 1945/05/08 78 y.o.  Virtual Visit via Telephone Note  I connected with pt by telephone and verified that I am speaking with the correct person using two identifiers.   I discussed the limitations, risks, security and privacy concerns of performing an evaluation and management service by telephone and the availability of in person appointments. I also discussed with the patient that there may be a patient responsible charge related to this service. The patient expressed understanding and agreed to proceed.  I discussed the assessment and treatment plan with the patient. The patient was provided an opportunity to ask questions and all were answered. The patient agreed with the plan and demonstrated an understanding of the instructions.   The patient was advised to call back or seek an in-person evaluation if the symptoms worsen or if the condition fails to improve as anticipated.  I provided 30 minutes of non-face-to-face time during this encounter. The patient was located at with daughter and the provider was located office. [phone]  Session 1130-1200   Subjective:   Patient ID:  Melinda Gomez is a 78 y.o. (DOB 01-25-1946) female.  Chief Complaint:  Chief Complaint  Patient presents with   Follow-up   Depression   Anxiety   Fatigue   AMANDY CHUBBUCK is followed up for chronic depression and anxiety and insomnia.  visit was July 15, 2018.  She was still dealing with chronic insomnia.  We discussed potential med changes but none were made per her request.  visit October 2020.  No meds were changed.  \July 15, 2019 appointment, the following is noted: 2 years of not sleeping.  Going to bed 10 pm .  Awakens a lot 4-5 hours sleep.  Denies napping.  Fight staying awake.   Caffeine 3 cups of coffee up to 5 pm.  Has to have it when removes dentures.  Drowsy daytime. Not anxious nor depressed.  Occ too irritable but not in mos except yesterday.  Would  like to sleep longer  No naps usually.  At night takes trazodone  150, hydroxyzine 25, no alprazolam  at night.  Calm and [name] agrees.  Overall is quite well. No SE meds.  Usual Xanax  prn anxiety not set time.  Not taken daily. Plan: Switch 5 PM coffee to decaf. Increase trazodone  to 200 mg HS.    01/13/2020 appointment with the following noted: Sleep is better with 7-8 hours. Cut PM caffeine.   Overall mood good except for a couple of weeks.  [name] did something 25 years ago and that bears on her mind at times but turned it over to God.    06/21/2020 appt today: Taking trazodone  200 mg HS. Good days and bad days.  Taking more alprazolam  than usual TID.  Found out 2 weeks ago that [name] has started up with girl he went to college with making her more nervous and upset.  He's aware she knows.  She thinks he's broken off the relationship but this is the third time.  Blames herself.  Can not have sex anymore.  More anxious and angry.  Otherwise mood is fine.  Sleep overall is better.  May nap in the morning.  No SE  01/04/21 appt today: Upset bc [name] running around on her and admitted to it.  He's done it in the past.  Says he's doing it to get back at her for her doing it early in their relationship.  Xanax  is helping her cope.   Her F  was alcoholic. Some low days but kept it in control.  Proud of herself. No SE and seeing PCP regularly Still smoking and says it helps her stress.  Stress smoker. Otherwise fine without depression. Mood seems fine even when doesn't get enough sleep.  Patient reports stable mood and denies depressed or irritable moods.  Patient denies any recent difficulty with anxiety.  Patient denies difficulty with sleep initiation and maintenance. Denies appetite disturbance.  Eating fine.  Patient reports that energy and motivation have been good.  Patient denies any difficulty with concentration.  Patient denies any suicidal ideation. Plan: Continue trazodone  to 50 mg HS bc says more  eoesn't help.   Option Seroquel for TR insomnia but greater SE risk.  Defer this Risperidone  1 mg HS Continue Effexor  XR 225 mg daily. Continue alprazolam  0.5 mg TID prn.  Don't exceed dose.  07/10/2021 appointment with the following noted CO bad heart and DM with dx systolic HF, LBB. Sleeps a lot but is all night now. Taking 300 mg trazodone  HS.  Can have hangover.   Quit smoking since here.   Appetite good but eating  better. Mood pretty good considering. No panic attacks.  Even in the hospital. Taking Xanax  as needed. Gets frustrated can't do housework anymore physically.  01/05/22 appt noted: Doctor's said lower part of heart is not functioning right.  Feels tired and legs swelling.  Couldn't walk to the office.  [name] been a big help Living at home.   D noted pt longterm paranoid tendency thinking H cheating on her and suspicious about meds. Consistent with meds. Anxiety kind of bad this last week.  Can't talk about it, "too many ears".  No full panic. Sleep is good.   Yesterday down but not usually.  Talks herself out of it. No SE. No med changes desired. Still Xanax  0.5 mg TID, Taking 200 mg trazodone  HS, venlafaxine  XR 225 mg daily, risperidone  1 mg HS.  No Benadryl   07/15/22 appt noted: Continues psych meds. Need help to get around.   More stresssed with health problems.  Anxiety is affected.  Dep managed. No SE with meds.   D [name] and H [name] helping with meds.   Put on wt and up to 160#.   Denies sleepiness after Xnaax.  No panic.   Appetite good. Vocal change without reason.   No anger.  No fear except at her limitations.  No paranoia.    01/14/23 appt noted: Taking alprazolam  twice weekly. Risperidone  1 mg HS, venlafaxine  XR 75 mg 1 AM and 2 PM, trazodone  200 mg HS. Overall pretty good but some bad days.  Can't function as well physically as she would like.   On heart meds for CVD.  Heart is better lately.  Hosp July for CHF. Breathing is good now unless  anxious then SOB.  Not as anxious since on heart meds.   On the same psych meds for years.   Sleep is good with meds.  Afraid not to take sleep meds.   No agitiation or anger outbursts.  No fearful except over her heart but better now.   No SE with meds.   Doesn't want med changes.   H [name] is doing fine.   Had episode NV this am but feels better now.   No SE No falls lately.  Using walker DT weakness.  07/15/23 appt noted: Med:  alprazolam  twice weekly. Risperidone  1 mg HS, venlafaxine  XR 75 mg 1 AM and 2 PM, trazodone   200 mg HS. No SE. Doing good.  depression and anxiety are under control.  Sleep pretty good.  No new concerns. PCP discovered that she had low B12.  The level was 186.  Husband reports that her energy is a little better with the addition of B12.  Husband also reports that he and her daughter noticed that she was somewhat lethargic in the morning and so they reduced the trazodone  to 150 mg at night and she seems more alert in the morning and is still sleeping okay.   Past Psychiatric Medication Trials: Trazodone  up to 150, alprazolam  0.5 TID, hydroxyzine, mirtazapine 7.5 jerks, Belsomra hangover. risperidone , buspirone, venlafaxine ,  Paxil 60 to 80 mg, Wellbutrin side effects, Prozac,   Review of Systems:  Review of Systems  Constitutional:  Positive for fatigue.  HENT:  Positive for voice change.   Respiratory:  Positive for shortness of breath.   Cardiovascular:  Negative for chest pain and palpitations.  Gastrointestinal:  Negative for diarrhea and vomiting.  Neurological:  Positive for weakness. Negative for tremors.       No recent falls.  Uses walker DT weakness  Psychiatric/Behavioral:  Negative for dysphoric mood and sleep disturbance. The patient is not nervous/anxious.   Has talked to Doctor about falls but no known diagnosis for it.  Not dizzy before falls. Can't get herself up when she falls.  Medications: I have reviewed the patient's current  medications.  Current Outpatient Medications  Medication Sig Dispense Refill   acetaminophen  (TYLENOL ) 500 MG tablet Take 2 tablets (1,000 mg total) by mouth every 8 (eight) hours as needed for fever, headache or mild pain (pain score 1-3).     albuterol  (VENTOLIN  HFA) 108 (90 Base) MCG/ACT inhaler Inhale 2 puffs into the lungs every 4 (four) hours as needed for shortness of breath or wheezing. 18 g 2   ALPRAZolam  (XANAX ) 0.5 MG tablet Take 1 tablet (0.5 mg total) by mouth 3 (three) times daily as needed for anxiety. (Patient taking differently: Take 0.5 mg by mouth 3 (three) times daily as needed for anxiety. Once weekly average) 15 tablet 0   aspirin  EC 81 MG tablet Take 1 tablet (81 mg total) by mouth daily. Swallow whole.     Budeson-Glycopyrrol-Formoterol  (BREZTRI  AEROSPHERE) 160-9-4.8 MCG/ACT AERO Inhale 2 puffs into the lungs in the morning and at bedtime. 10.7 g 11   cetirizine (ZYRTEC) 10 MG tablet Take 10 mg by mouth daily.     cholestyramine  (QUESTRAN ) 4 GM/DOSE powder Take 4 g by mouth See admin instructions. 13 grams added to 2-6 oz of water as needed     Cyanocobalamin (B-12) 1000 MCG TABS Take 1,000 mcg by mouth daily in the afternoon.     cyclobenzaprine (FLEXERIL) 10 MG tablet Take 10 mg by mouth daily as needed for muscle spasms.     dapagliflozin  propanediol (FARXIGA ) 10 MG TABS tablet Take 1 tablet (10 mg total) by mouth daily before breakfast. 90 tablet 3   furosemide  (LASIX ) 20 MG tablet Take 1 tablet (20mg ) as needed for weight gain greater than 3lb overnight or 5lb in 1 week. 90 tablet 3   metoprolol  succinate (TOPROL -XL) 25 MG 24 hr tablet Take 1 tablet (25 mg total) by mouth daily. 90 tablet 3   promethazine (PHENERGAN) 12.5 MG tablet Take 12.5 mg by mouth every 6 (six) hours as needed for nausea.     rizatriptan (MAXALT) 5 MG tablet Take 5 mg by mouth as needed for migraine.  rosuvastatin  (CRESTOR ) 20 MG tablet Take 20 mg by mouth daily.     sacubitril -valsartan   (ENTRESTO ) 24-26 MG Take 1 tablet by mouth 2 (two) times daily. 180 tablet 3   spironolactone  (ALDACTONE ) 25 MG tablet TAKE 1/2 TABLET BY MOUTH DAILY FOR FLUID. 45 tablet 2   risperiDONE  (RISPERDAL ) 1 MG tablet Take 1 tablet (1 mg total) by mouth at bedtime. 90 tablet 1   traZODone  (DESYREL ) 100 MG tablet Take 1.5 tablets (150 mg total) by mouth at bedtime. 135 tablet 2   venlafaxine  XR (EFFEXOR -XR) 75 MG 24 hr capsule TAKE 1 CAPSULE DURING THE DAY AND 2 CAPSULES AT BEDTIME FOR DEPRESSION. 90 capsule 8   No current facility-administered medications for this visit.    Medication Side Effects: None  No evidence for falls related to meds.  No pattern to falls. Allergies:  Allergies  Allergen Reactions   Hydrochlorothiazide Other (See Comments)    hyponatremia   Lisinopril Cough   Norpramin [Desipramine] Other (See Comments)    Jittery   Other Other (See Comments)    Estratest  depression & palpitation   Talwin [Pentazocine] Other (See Comments)    hallucinations   Verapamil  Cough    Past Medical History:  Diagnosis Date   CHF (congestive heart failure) (HCC)    COPD (chronic obstructive pulmonary disease) (HCC)    Coronary artery disease    GAD (generalized anxiety disorder)    GERD (gastroesophageal reflux disease)    Hypercholesteremia    Hypertension     Family History  Problem Relation Age of Onset   Stroke Mother     Social History   Socioeconomic History   Marital status: Married    Spouse name: Not on file   Number of children: 2   Years of education: Not on file   Highest education level: Not on file  Occupational History   Occupation: Retired - IT consultant  Tobacco Use   Smoking status: Former    Current packs/day: 0.00    Average packs/day: 0.5 packs/day for 55.0 years (27.5 ttl pk-yrs)    Types: Cigarettes    Start date: 02/1966    Quit date: 02/2021    Years since quitting: 2.3   Smokeless tobacco: Never  Substance and Sexual Activity    Alcohol use: Never   Drug use: Never   Sexual activity: Not on file  Other Topics Concern   Not on file  Social History Narrative   Not on file   Social Drivers of Health   Financial Resource Strain: Not on file  Food Insecurity: No Food Insecurity (01/31/2023)   Hunger Vital Sign    Worried About Running Out of Food in the Last Year: Never true    Ran Out of Food in the Last Year: Never true  Transportation Needs: No Transportation Needs (01/31/2023)   PRAPARE - Administrator, Civil Service (Medical): No    Lack of Transportation (Non-Medical): No  Physical Activity: Not on file  Stress: Not on file  Social Connections: Not on file  Intimate Partner Violence: Not At Risk (01/31/2023)   Humiliation, Afraid, Rape, and Kick questionnaire    Fear of Current or Ex-Partner: No    Emotionally Abused: No    Physically Abused: No    Sexually Abused: No    Past Medical History, Surgical history, Social history, and Family history were reviewed and updated as appropriate.   Please see review of systems for further details on  the patient's review from today.   Objective:   Physical Exam:  There were no vitals taken for this visit.  Physical Exam Neurological:     Mental Status: She is alert and oriented to person, place, and time.     Cranial Nerves: No dysarthria.  Psychiatric:        Attention and Perception: Attention and perception normal.        Mood and Affect: Mood is not anxious or depressed.        Speech: Speech normal.        Behavior: Behavior is cooperative.        Thought Content: Thought content normal. Thought content is not paranoid or delusional. Thought content does not include homicidal or suicidal ideation. Thought content does not include suicidal plan.        Cognition and Memory: Cognition and memory normal.        Judgment: Judgment normal.     Comments: Insight fair Depression and anxiety are improved     Lab Review:     Component  Value Date/Time   NA 139 03/04/2023 1155   NA 138 12/02/2022 1236   K 3.9 03/04/2023 1155   CL 108 03/04/2023 1155   CO2 18 (L) 03/04/2023 1155   GLUCOSE 118 (H) 03/04/2023 1155   BUN 18 03/04/2023 1155   BUN 14 12/02/2022 1236   CREATININE 1.52 (H) 03/04/2023 1155   CALCIUM  9.7 03/04/2023 1155   PROT 7.1 03/04/2023 1155   PROT 6.8 04/18/2022 1600   ALBUMIN 4.0 03/04/2023 1155   ALBUMIN 4.6 04/18/2022 1600   AST 27 03/04/2023 1155   ALT 25 03/04/2023 1155   ALKPHOS 56 03/04/2023 1155   BILITOT 0.5 03/04/2023 1155   BILITOT <0.2 04/18/2022 1600   GFRNONAA 35 (L) 03/04/2023 1155   GFRAA  07/30/2008 0945    >60        The eGFR has been calculated using the MDRD equation. This calculation has not been validated in all clinical situations. eGFR's persistently <60 mL/min signify possible Chronic Kidney Disease.       Component Value Date/Time   WBC 7.3 03/04/2023 1155   RBC 4.58 03/04/2023 1155   HGB 12.8 03/04/2023 1155   HGB 14.7 04/18/2022 1600   HCT 41.6 03/04/2023 1155   HCT 43.9 04/18/2022 1600   PLT 185 03/04/2023 1155   PLT 203 04/18/2022 1600   MCV 90.8 03/04/2023 1155   MCV 89 04/18/2022 1600   MCH 27.9 03/04/2023 1155   MCHC 30.8 03/04/2023 1155   RDW 15.1 03/04/2023 1155   RDW 12.9 04/18/2022 1600   LYMPHSABS 1.8 01/23/2023 1739   MONOABS 0.7 01/23/2023 1739   EOSABS 0.1 01/23/2023 1739   BASOSABS 0.0 01/23/2023 1739    No results found for: "POCLITH", "LITHIUM"   No results found for: "PHENYTOIN", "PHENOBARB", "VALPROATE", "CBMZ"    Latest Reference Range & Units 05/26/23 10:36  Vitamin B12 232 - 1,245 pg/mL 186 (L)  (L): Data is abnormally low Started B12 1000 mcg daily after this  .res Assessment: Plan:    Major depressive disorder, recurrent episode, moderate (HCC) - Plan: venlafaxine  XR (EFFEXOR -XR) 75 MG 24 hr capsule  PTSD (post-traumatic stress disorder) - Plan: risperiDONE  (RISPERDAL ) 1 MG tablet, venlafaxine  XR (EFFEXOR -XR) 75 MG 24  hr capsule  Insomnia due to mental condition - Plan: traZODone  (DESYREL ) 100 MG tablet  Paranoid disorder (HCC) - Plan: risperiDONE  (RISPERDAL ) 1 MG tablet  B12 deficiency  Disturbed sleep rhythm -  Plan: traZODone  (DESYREL ) 100 MG tablet   We discussed she is more anxious with health problems worsening but handling.  Including heart problems.    Previously Spoke with daughter who is caretaking.  Patient tends to minimize her problems especially her health problems.  She is not motivated to take care of herself.  Spending a lot of time just sitting.  Will not do any chores.  Congestive heart failure is a contributing problem to low energy. Daughter confirms longstanding history of underlying paranoia but which is not highly disruptive.  No anger or acting out episodes lately.  No paranoia elicited.  Lately her sleep has been  better than usual and able to reduce trazodone  250 mg at bedtime and is more alert in the morning with the reduction..  She does appear to be getting a greater quantity of sleep than last visit which is positive for her overall mental health..   We will avoid benzodiazepines at night for sleep because she has a high risk of tolerance with those.  We discussed the fall risk related to any sedative medication.    Extensive discussion of the importance of normal B12 for good mental health including lessening depression and improving energy and cognition.  Discussed recent study that suggested that even low normal B12 levels could cause symptoms of impaired cognition and could be improved with supplemental B12.  Emphasized compliance to both the patient and her husband.  Disc importance of FU level which they had planned to avoid.  Disc risk of impaired absorption in some people. She is starting 1000 mcg vitamin B-12 and has seen some benefit already.  Supportive therapy dealing with decline of health and limited ability to do things.  Continue after 5 PM coffee to decaf.   Sleep is better.  Continue trazodone  to 100-150 mg HS but try lower dose.   Risperidone  1 mg HS.  Unlikely this is causing fatigue. Helping anxiety and mild paranoia.  Consider reduction in future DT aging. Continue Effexor  XR 225 mg daily. Continue alprazolam  0.5 mg TID prn.  Don't exceed dose. She is only taking a couple per week.  No use of Benadryl  now  Overall her depression and anxiety associated with PTSD are fairly managed.  She has a long psychiatric history but in general has been more stable in the last few years except for the chronic insomnia complaints which are better. She has a fairly restricted life which is how in part she manages her anxiety but it does not contribute to depression so she satisfied.  She does not want medicine change today.  No medicine changes today  Follow-up 9 months because of stability  Nori Beat, MD, DFAPA    Please see After Visit Summary for patient specific instructions.  Future Appointments  Date Time Provider Department Center  01/22/2024  1:00 PM Lisabeth Rider, MD [facility] None    No orders of the defined types were placed in this encounter.     -------------------------------c

## 2023-07-20 ENCOUNTER — Other Ambulatory Visit: Payer: Self-pay | Admitting: Psychiatry

## 2023-07-20 DIAGNOSIS — F5105 Insomnia due to other mental disorder: Secondary | ICD-10-CM

## 2023-07-20 DIAGNOSIS — G4720 Circadian rhythm sleep disorder, unspecified type: Secondary | ICD-10-CM

## 2023-07-22 DIAGNOSIS — E538 Deficiency of other specified B group vitamins: Secondary | ICD-10-CM | POA: Diagnosis not present

## 2023-08-19 ENCOUNTER — Other Ambulatory Visit: Payer: Self-pay

## 2023-08-19 ENCOUNTER — Other Ambulatory Visit: Payer: Self-pay | Admitting: Psychiatry

## 2023-08-19 DIAGNOSIS — F4310 Post-traumatic stress disorder, unspecified: Secondary | ICD-10-CM

## 2023-08-19 MED ORDER — ALPRAZOLAM 0.5 MG PO TABS: 0.5000 mg | ORAL_TABLET | Freq: Three times a day (TID) | ORAL | 0 refills | Status: DC | PRN

## 2023-08-27 ENCOUNTER — Telehealth: Payer: Self-pay | Admitting: Psychiatry

## 2023-08-27 ENCOUNTER — Other Ambulatory Visit: Payer: Self-pay | Admitting: Psychiatry

## 2023-08-27 MED ORDER — LITHIUM CARBONATE 150 MG PO CAPS: 150.0000 mg | ORAL_CAPSULE | Freq: Every day | ORAL | 1 refills | Status: DC

## 2023-08-27 NOTE — Telephone Encounter (Addendum)
 Daughter said pt is making comments that are concerning. She hasn't said she wanted to hurt herself, but mentioned having a dream about committing suicide and previously mentioning she might be better off dead. Dtr reports she has attempted in the past. She doesn't have access to guns. Dtr isn't sure she has a plan. I asked about meds and she said she had access but mobility is limited. Reviewed emergency resources with the dtr.   Was last seen 4/22. Reviewed meds with dtr.  She is taking 200 mg trazodone , but otherwise taking as listed.   Continue trazodone  to 100-150 mg HS but try lower dose.   Risperidone  1 mg HS.     Continue Effexor  XR 225 mg daily. Continue alprazolam  0.5 mg TID prn.    Memory issues since stroke.  Was to FU in 9 mo.

## 2023-08-27 NOTE — Telephone Encounter (Signed)
 Pt's daughter called at 3:37p.  She is on DPR.  She states mom had a stroke in Oct.  Pt has recently talked about suicide.  Pt's daughter isn't sure how to handle it.  No upcoming appts

## 2023-08-27 NOTE — Telephone Encounter (Signed)
 She has had a stroke and has some cognitive problems and difficulty accessing means to suicide.  There is a family history of suicidality with benefit from lithium.  Will add lowest dose of lithium to address the dep with SI. Sent RX lithium 150 mg HS.    Nori Beat, MD, DFAPA

## 2023-08-28 NOTE — Telephone Encounter (Signed)
 Daughter advised of RF and recommendations.

## 2023-09-01 ENCOUNTER — Encounter: Payer: Self-pay | Admitting: Cardiovascular Disease

## 2023-09-01 MED ORDER — DAPAGLIFLOZIN PROPANEDIOL 10 MG PO TABS: 10.0000 mg | ORAL_TABLET | Freq: Every day | ORAL | 1 refills | Status: AC

## 2023-10-05 ENCOUNTER — Other Ambulatory Visit: Payer: Self-pay | Admitting: Psychiatry

## 2023-10-05 DIAGNOSIS — F4310 Post-traumatic stress disorder, unspecified: Secondary | ICD-10-CM

## 2023-10-05 DIAGNOSIS — F331 Major depressive disorder, recurrent, moderate: Secondary | ICD-10-CM

## 2023-10-12 ENCOUNTER — Other Ambulatory Visit: Payer: Self-pay | Admitting: Psychiatry

## 2023-10-13 ENCOUNTER — Telehealth: Payer: Self-pay | Admitting: Pharmacy Technician

## 2023-10-13 ENCOUNTER — Encounter: Payer: Self-pay | Admitting: Cardiovascular Disease

## 2023-10-13 NOTE — Telephone Encounter (Signed)
 From pt advice request  I called [employer] and they said they have the provider form but they are missing the patient application for 2025.   I called and spoke to the daughter and she asked me to email her the application. She will send back to me

## 2023-10-20 NOTE — Telephone Encounter (Signed)
 Per daughter she faxed [employer] paperwork tues or Wednesday last week.  She got a confirmation that they received it

## 2023-10-20 NOTE — Telephone Encounter (Signed)
 Lmom for daughter to [facility] on application

## 2023-10-27 NOTE — Telephone Encounter (Signed)
 Approval dates 10/14/23-03/24/24   They mailed farxiga  on 10/21/23 to :[address]  [place] [phone]   Daughter aware

## 2023-10-29 DIAGNOSIS — E538 Deficiency of other specified B group vitamins: Secondary | ICD-10-CM | POA: Diagnosis not present

## 2023-10-29 DIAGNOSIS — R531 Weakness: Secondary | ICD-10-CM | POA: Diagnosis not present

## 2023-11-12 DIAGNOSIS — E538 Deficiency of other specified B group vitamins: Secondary | ICD-10-CM | POA: Diagnosis not present

## 2023-11-12 DIAGNOSIS — Z7982 Long term (current) use of aspirin: Secondary | ICD-10-CM | POA: Diagnosis not present

## 2023-11-12 DIAGNOSIS — Z87440 Personal history of urinary (tract) infections: Secondary | ICD-10-CM | POA: Diagnosis not present

## 2023-11-12 DIAGNOSIS — Z79899 Other long term (current) drug therapy: Secondary | ICD-10-CM | POA: Diagnosis not present

## 2023-11-12 DIAGNOSIS — Z993 Dependence on wheelchair: Secondary | ICD-10-CM | POA: Diagnosis not present

## 2023-11-12 DIAGNOSIS — I5022 Chronic systolic (congestive) heart failure: Secondary | ICD-10-CM | POA: Diagnosis not present

## 2023-11-12 DIAGNOSIS — Z8673 Personal history of transient ischemic attack (TIA), and cerebral infarction without residual deficits: Secondary | ICD-10-CM | POA: Diagnosis not present

## 2023-11-12 DIAGNOSIS — F419 Anxiety disorder, unspecified: Secondary | ICD-10-CM | POA: Diagnosis not present

## 2023-11-12 DIAGNOSIS — Z7984 Long term (current) use of oral hypoglycemic drugs: Secondary | ICD-10-CM | POA: Diagnosis not present

## 2023-11-12 DIAGNOSIS — E119 Type 2 diabetes mellitus without complications: Secondary | ICD-10-CM | POA: Diagnosis not present

## 2023-11-12 DIAGNOSIS — M549 Dorsalgia, unspecified: Secondary | ICD-10-CM | POA: Diagnosis not present

## 2023-11-12 DIAGNOSIS — Z7951 Long term (current) use of inhaled steroids: Secondary | ICD-10-CM | POA: Diagnosis not present

## 2023-11-12 DIAGNOSIS — I2510 Atherosclerotic heart disease of native coronary artery without angina pectoris: Secondary | ICD-10-CM | POA: Diagnosis not present

## 2023-11-12 DIAGNOSIS — J449 Chronic obstructive pulmonary disease, unspecified: Secondary | ICD-10-CM | POA: Diagnosis not present

## 2023-11-12 DIAGNOSIS — F4310 Post-traumatic stress disorder, unspecified: Secondary | ICD-10-CM | POA: Diagnosis not present

## 2023-11-12 DIAGNOSIS — I110 Hypertensive heart disease with heart failure: Secondary | ICD-10-CM | POA: Diagnosis not present

## 2023-11-12 DIAGNOSIS — F32A Depression, unspecified: Secondary | ICD-10-CM | POA: Diagnosis not present

## 2023-11-12 DIAGNOSIS — G8929 Other chronic pain: Secondary | ICD-10-CM | POA: Diagnosis not present

## 2023-11-12 DIAGNOSIS — Z556 Problems related to health literacy: Secondary | ICD-10-CM | POA: Diagnosis not present

## 2023-11-12 DIAGNOSIS — Z9181 History of falling: Secondary | ICD-10-CM | POA: Diagnosis not present

## 2023-11-12 DIAGNOSIS — F4000 Agoraphobia, unspecified: Secondary | ICD-10-CM | POA: Diagnosis not present

## 2023-11-12 DIAGNOSIS — Z87891 Personal history of nicotine dependence: Secondary | ICD-10-CM | POA: Diagnosis not present

## 2023-11-12 DIAGNOSIS — M542 Cervicalgia: Secondary | ICD-10-CM | POA: Diagnosis not present

## 2023-11-14 ENCOUNTER — Other Ambulatory Visit: Payer: Self-pay | Admitting: Psychiatry

## 2023-11-14 DIAGNOSIS — F22 Delusional disorders: Secondary | ICD-10-CM

## 2023-11-14 DIAGNOSIS — F4310 Post-traumatic stress disorder, unspecified: Secondary | ICD-10-CM

## 2023-11-17 ENCOUNTER — Emergency Department (HOSPITAL_COMMUNITY)

## 2023-11-17 ENCOUNTER — Encounter (HOSPITAL_COMMUNITY): Payer: Self-pay | Admitting: Emergency Medicine

## 2023-11-17 ENCOUNTER — Emergency Department (HOSPITAL_COMMUNITY)
Admission: EM | Admit: 2023-11-17 | Discharge: 2023-11-17 | Disposition: A | Discharge status: Home / Self Care | Source: Ambulatory Visit | Attending: Emergency Medicine | Admitting: Emergency Medicine

## 2023-11-17 ENCOUNTER — Other Ambulatory Visit: Payer: Self-pay

## 2023-11-17 DIAGNOSIS — R0902 Hypoxemia: Secondary | ICD-10-CM | POA: Insufficient documentation

## 2023-11-17 DIAGNOSIS — Z8673 Personal history of transient ischemic attack (TIA), and cerebral infarction without residual deficits: Secondary | ICD-10-CM | POA: Insufficient documentation

## 2023-11-17 DIAGNOSIS — I110 Hypertensive heart disease with heart failure: Secondary | ICD-10-CM | POA: Diagnosis not present

## 2023-11-17 DIAGNOSIS — R4182 Altered mental status, unspecified: Secondary | ICD-10-CM | POA: Diagnosis not present

## 2023-11-17 DIAGNOSIS — I2510 Atherosclerotic heart disease of native coronary artery without angina pectoris: Secondary | ICD-10-CM | POA: Insufficient documentation

## 2023-11-17 DIAGNOSIS — I509 Heart failure, unspecified: Secondary | ICD-10-CM | POA: Diagnosis not present

## 2023-11-17 DIAGNOSIS — I63511 Cerebral infarction due to unspecified occlusion or stenosis of right middle cerebral artery: Secondary | ICD-10-CM | POA: Diagnosis not present

## 2023-11-17 DIAGNOSIS — I6782 Cerebral ischemia: Secondary | ICD-10-CM | POA: Diagnosis not present

## 2023-11-17 DIAGNOSIS — J449 Chronic obstructive pulmonary disease, unspecified: Secondary | ICD-10-CM | POA: Diagnosis not present

## 2023-11-17 DIAGNOSIS — G9389 Other specified disorders of brain: Secondary | ICD-10-CM | POA: Diagnosis not present

## 2023-11-17 DIAGNOSIS — N390 Urinary tract infection, site not specified: Secondary | ICD-10-CM | POA: Diagnosis not present

## 2023-11-17 DIAGNOSIS — I959 Hypotension, unspecified: Secondary | ICD-10-CM | POA: Diagnosis not present

## 2023-11-17 DIAGNOSIS — R531 Weakness: Secondary | ICD-10-CM | POA: Insufficient documentation

## 2023-11-17 DIAGNOSIS — R300 Dysuria: Secondary | ICD-10-CM | POA: Insufficient documentation

## 2023-11-17 DIAGNOSIS — Z7982 Long term (current) use of aspirin: Secondary | ICD-10-CM | POA: Diagnosis not present

## 2023-11-17 DIAGNOSIS — I700 Atherosclerosis of aorta: Secondary | ICD-10-CM | POA: Diagnosis not present

## 2023-11-17 HISTORY — DX: Cerebral infarction, unspecified: I63.9

## 2023-11-17 LAB — BLOOD GAS, VENOUS
Acid-base deficit: 2.2 mmol/L — ABNORMAL HIGH (ref 0.0–2.0)
Bicarbonate: 25.4 mmol/L (ref 20.0–28.0)
Drawn by: 8768
O2 Saturation: 18.3 %
Patient temperature: 36.6
pCO2, Ven: 53 mmHg (ref 44–60)
pH, Ven: 7.29 (ref 7.25–7.43)
pO2, Ven: <31 mmHg — CL (ref 32–45)

## 2023-11-17 LAB — URINALYSIS, W/ REFLEX TO CULTURE (INFECTION SUSPECTED)
Bacteria, UA: NONE SEEN
Bilirubin Urine: NEGATIVE
Glucose, UA: >=500 mg/dL — AB
Hgb urine dipstick: NEGATIVE
Ketones, ur: NEGATIVE mg/dL
Leukocytes,Ua: NEGATIVE
Nitrite: NEGATIVE
Protein, ur: NEGATIVE mg/dL
Specific Gravity, Urine: 1.007 (ref 1.005–1.030)
pH: 5 (ref 5.0–8.0)

## 2023-11-17 LAB — CBC WITH DIFFERENTIAL/PLATELET
Abs Immature Granulocytes: 0.03 K/uL (ref 0.00–0.07)
Basophils Absolute: 0.1 K/uL (ref 0.0–0.1)
Basophils Relative: 1 %
Eosinophils Absolute: 0.3 K/uL (ref 0.0–0.5)
Eosinophils Relative: 3 %
HCT: 43.3 % (ref 36.0–46.0)
Hemoglobin: 14.2 g/dL (ref 12.0–15.0)
Immature Granulocytes: 0 %
Lymphocytes Relative: 21 %
Lymphs Abs: 1.9 K/uL (ref 0.7–4.0)
MCH: 30.9 pg (ref 26.0–34.0)
MCHC: 32.8 g/dL (ref 30.0–36.0)
MCV: 94.1 fL (ref 80.0–100.0)
Monocytes Absolute: 0.9 K/uL (ref 0.1–1.0)
Monocytes Relative: 9 %
Neutro Abs: 6 K/uL (ref 1.7–7.7)
Neutrophils Relative %: 66 %
Platelets: 182 K/uL (ref 150–400)
RBC: 4.6 MIL/uL (ref 3.87–5.11)
RDW: 13.9 % (ref 11.5–15.5)
WBC: 9.2 K/uL (ref 4.0–10.5)
nRBC: 0 % (ref 0.0–0.2)

## 2023-11-17 LAB — MAGNESIUM: Magnesium: 1.9 mg/dL (ref 1.7–2.4)

## 2023-11-17 LAB — COMPREHENSIVE METABOLIC PANEL WITH GFR
ALT: 21 U/L (ref 0–44)
AST: 16 U/L (ref 15–41)
Albumin: 3.7 g/dL (ref 3.5–5.0)
Alkaline Phosphatase: 44 U/L (ref 38–126)
Anion gap: 6 (ref 5–15)
BUN: 20 mg/dL (ref 8–23)
CO2: 21 mmol/L — ABNORMAL LOW (ref 22–32)
Calcium: 9.2 mg/dL (ref 8.9–10.3)
Chloride: 110 mmol/L (ref 98–111)
Creatinine, Ser: 1.22 mg/dL — ABNORMAL HIGH (ref 0.44–1.00)
GFR, Estimated: 46 mL/min — ABNORMAL LOW (ref >60)
Glucose, Bld: 113 mg/dL — ABNORMAL HIGH (ref 70–99)
Potassium: 3.6 mmol/L (ref 3.5–5.1)
Sodium: 137 mmol/L (ref 135–145)
Total Bilirubin: 0.6 mg/dL (ref 0.0–1.2)
Total Protein: 6.3 g/dL — ABNORMAL LOW (ref 6.5–8.1)

## 2023-11-17 LAB — RESP PANEL BY RT-PCR (RSV, FLU A&B, COVID)  RVPGX2
Influenza A by PCR: NEGATIVE
Influenza B by PCR: NEGATIVE
Resp Syncytial Virus by PCR: NEGATIVE
SARS Coronavirus 2 by RT PCR: NEGATIVE

## 2023-11-17 LAB — BRAIN NATRIURETIC PEPTIDE: B Natriuretic Peptide: 30 pg/mL (ref 0.0–100.0)

## 2023-11-17 MED ORDER — IOHEXOL 350 MG/ML SOLN
75.0000 mL | Freq: Once | INTRAVENOUS | Status: AC | PRN
  Administered 2023-11-17: 75 mL via INTRAVENOUS

## 2023-11-17 MED ORDER — IPRATROPIUM-ALBUTEROL 0.5-2.5 (3) MG/3ML IN SOLN
3.0000 mL | Freq: Once | RESPIRATORY_TRACT | Status: AC
  Administered 2023-11-17: 3 mL via RESPIRATORY_TRACT
  Filled 2023-11-17: qty 3

## 2023-11-17 MED ORDER — LACTATED RINGERS IV BOLUS
500.0000 mL | Freq: Once | INTRAVENOUS | Status: AC
  Administered 2023-11-17: 500 mL via INTRAVENOUS

## 2023-11-17 NOTE — Discharge Instructions (Addendum)
 Your test results today were reassuring.  Continue your antibiotics and other home medications as prescribed.  Utilize your inhaler as needed for shortness of breath, cough, and/or fatigue.  Follow-up with your primary care doctor.  Return to the emergency department for any new or worsening symptoms of concern.  Your CT scan did show the following incidental finding: Multiple pulmonary nodules. Most significant: Right solid pulmonary nodule measuring 6 mm You should get a repeat chest CT scan in 3 to 6 months to assess for any changes.  Talk to your primary care doctor about this.

## 2023-11-17 NOTE — ED Triage Notes (Signed)
 Per [facility] pt coming from home- husband called out stating patient is not as on top of things as usual. Diagnosed with UTI Friday and started on keflex . Hx of stroke and ems states no new weakness then baseline. Wheelchair at home.

## 2023-11-17 NOTE — ED Provider Notes (Signed)
 [place] EMERGENCY DEPARTMENT AT [facility] Provider Note   CSN: [id number] Arrival date & time: 11/17/23  1238     Patient presents with: Weakness   Melinda Gomez is a 78 y.o. female.    Weakness Associated symptoms: dysuria   Patient presents for generalized weakness.  Medical history includes GERD, COPD, anxiety, CHF, CVA, HTN, CAD.  She is currently on Keflex  for treatment of UTI.  She was started on antibiotics on Friday based on recent dysuria.  She did not get a urinalysis done.  Patient has had progressive generalized weakness over the weekend.  At baseline, she has poor mobility due to CVA that occurred last October, for which she has ongoing left hemibody deficits.  She is typically able to transfer with assistance.  This morning, she had increased difficulty with transfers.  Husband reports that she has maintained a good appetite.  Patient is not on oxygen at baseline.     Prior to Admission medications   Medication Sig Start Date End Date Taking? Authorizing Provider  lithium  carbonate 150 MG capsule Take 1 capsule (150 mg total) by mouth daily. 10/12/23   Cottle, Lorene KANDICE Raddle., MD  acetaminophen  (TYLENOL ) 500 MG tablet Take 2 tablets (1,000 mg total) by mouth every 8 (eight) hours as needed for fever, headache or mild pain (pain score 1-3). 01/30/23 01/30/24  Ricky Fines, MD  albuterol  (VENTOLIN  HFA) [phone] [id number] Base) MCG/ACT inhaler Inhale 2 puffs into the lungs every 4 (four) hours as needed for shortness of breath or wheezing. 10/03/22   Pearlean Manus, MD  ALPRAZolam  (XANAX ) 0.5 MG tablet Take 1 tablet (0.5 mg total) by mouth 3 (three) times daily as needed for anxiety. 08/19/23   Cottle, Lorene KANDICE Raddle., MD  aspirin  EC 81 MG tablet Take 1 tablet (81 mg total) by mouth daily. Swallow whole. 01/31/23   Ricky Fines, MD  Budeson-Glycopyrrol-Formoterol  (BREZTRI  AEROSPHERE) 160-9-4.8 MCG/ACT AERO Inhale 2 puffs into the lungs in the morning and at bedtime. 11/07/22    Kassie Acquanetta Bradley, MD  cetirizine (ZYRTEC) 10 MG tablet Take 10 mg by mouth daily.    [provider]  cholestyramine  (QUESTRAN ) 4 GM/DOSE powder Take 4 g by mouth See admin instructions. 13 grams added to 2-6 oz of water as needed    [provider]  Cyanocobalamin  (B-12) 1000 MCG TABS Take 1,000 mcg by mouth daily in the afternoon.    [provider]  cyclobenzaprine (FLEXERIL) 10 MG tablet Take 10 mg by mouth daily as needed for muscle spasms.    [provider]  dapagliflozin  propanediol (FARXIGA ) 10 MG TABS tablet Take 1 tablet (10 mg total) by mouth daily before breakfast. 09/01/23   O'Neal, Darryle Ned, MD  furosemide  (LASIX ) 20 MG tablet Take 1 tablet (20mg ) as needed for weight gain greater than 3lb overnight or 5lb in 1 week. 03/25/23   O'NealDarryle Ned, MD  metoprolol  succinate (TOPROL -XL) 25 MG 24 hr tablet Take 1 tablet (25 mg total) by mouth daily. 03/25/23   O'NealDarryle Ned, MD  promethazine (PHENERGAN) 12.5 MG tablet Take 12.5 mg by mouth every 6 (six) hours as needed for nausea.    [provider]  risperiDONE  (RISPERDAL ) 1 MG tablet TAKE ONE TABLET BY MOUTH AT BEDTIME 11/14/23   Cottle, Lorene KANDICE Raddle., MD  rizatriptan (MAXALT) 5 MG tablet Take 5 mg by mouth as needed for migraine. 09/24/22   [provider]  rosuvastatin  (CRESTOR ) 20 MG tablet Take  20 mg by mouth daily.    [provider]  sacubitril -valsartan  (ENTRESTO ) 24-26 MG Take 1 tablet by mouth 2 (two) times daily. 10/24/22   Barbaraann Darryle Ned, MD  spironolactone  (ALDACTONE ) 25 MG tablet TAKE 1/2 TABLET BY MOUTH DAILY FOR FLUID. 06/23/23   O'Neal, Darryle Ned, MD  traZODone  (DESYREL ) 100 MG tablet Take 1.5 tablets (150 mg total) by mouth at bedtime. 07/15/23   Cottle, Lorene KANDICE Raddle., MD  venlafaxine  XR (EFFEXOR -XR) 75 MG 24 hr capsule TAKE 1 CAPSULE DURING THE DAY AND 2 CAPSULES AT BEDTIME FOR DEPRESSION. 10/05/23   Cottle, Lorene KANDICE Raddle., MD    Allergies:  Hydrochlorothiazide, Lisinopril, Norpramin [desipramine], Other, Talwin [pentazocine], and Verapamil     Review of Systems  Constitutional:  Positive for fatigue.  Genitourinary:  Positive for dysuria.  Neurological:  Positive for weakness (Generalized).  All other systems reviewed and are negative.   Updated Vital Signs BP (!) 130/47   Pulse 80   Temp 97.6 F (36.4 C) (Oral)   Resp 20   Ht 5' 2 (1.575 m)   Wt 65.3 kg   SpO2 95%   BMI 26.34 kg/m   Physical Exam Vitals and nursing note reviewed.  Constitutional:      General: She is not in acute distress.    Appearance: Normal appearance. She is well-developed. She is not toxic-appearing or diaphoretic.  HENT:     Head: Normocephalic and atraumatic.     Right Ear: External ear normal.     Left Ear: External ear normal.     Nose: Nose normal.     Mouth/Throat:     Mouth: Mucous membranes are moist.  Eyes:     Extraocular Movements: Extraocular movements intact.     Conjunctiva/sclera: Conjunctivae normal.  Cardiovascular:     Rate and Rhythm: Normal rate and regular rhythm.  Pulmonary:     Effort: Pulmonary effort is normal. No tachypnea or respiratory distress.     Breath sounds: Decreased breath sounds present.  Abdominal:     General: There is no distension.     Palpations: Abdomen is soft.     Tenderness: There is no abdominal tenderness.  Musculoskeletal:        General: No swelling.     Cervical back: Normal range of motion and neck supple.  Skin:    General: Skin is warm and dry.     Coloration: Skin is not jaundiced or pale.  Neurological:     Mental Status: She is alert and oriented to person, place, and time. Mental status is at baseline.     Comments: Baseline left hemibody weakness, more prominent in lower extremity.  Psychiatric:        Mood and Affect: Mood normal.        Behavior: Behavior normal.     (all labs ordered are listed, but only abnormal results are displayed) Labs Reviewed  BLOOD  GAS, VENOUS - Abnormal; Notable for the following components:      Result Value   pO2, Ven <31 (*)    Acid-base deficit 2.2 (*)    All other components within normal limits  URINALYSIS, W/ REFLEX TO CULTURE (INFECTION SUSPECTED) - Abnormal; Notable for the following components:   Glucose, UA >=500 (*)    All other components within normal limits  COMPREHENSIVE METABOLIC PANEL WITH GFR - Abnormal; Notable for the following components:   CO2 21 (*)    Glucose, Bld 113 (*)    Creatinine, Ser 1.22 (*)  Total Protein 6.3 (*)    GFR, Estimated 46 (*)    All other components within normal limits  RESP PANEL BY RT-PCR (RSV, FLU A&B, COVID)  RVPGX2  URINE CULTURE  BRAIN NATRIURETIC PEPTIDE  CBC WITH DIFFERENTIAL/PLATELET  MAGNESIUM     EKG: EKG Interpretation Date/Time:  Monday November 17 2023 13:32:46 EDT Ventricular Rate:  65 PR Interval:  222 QRS Duration:  148 QT Interval:  483 QTC Calculation: 503 R Axis:   85  Text Interpretation: Sinus rhythm Prolonged PR interval Nonspecific intraventricular conduction delay Borderline abnrm T, anterolateral leads Confirmed by Melvenia Motto ([id number]) on 11/17/2023 1:58:04 PM  Radiology: CT Angio Chest PE W and/or Wo Contrast Result Date: 11/17/2023 CLINICAL DATA:  Altered mental status and history of UTI EXAM: CT ANGIOGRAPHY CHEST WITH CONTRAST TECHNIQUE: Multidetector CT imaging of the chest was performed using the standard protocol during bolus administration of intravenous contrast. Multiplanar CT image reconstructions and MIPs were obtained to evaluate the vascular anatomy. RADIATION DOSE REDUCTION: This exam was performed according to the departmental dose-optimization program which includes automated exposure control, adjustment of the mA and/or kV according to patient size and/or use of iterative reconstruction technique. CONTRAST:  75mL OMNIPAQUE  IOHEXOL  350 MG/ML SOLN COMPARISON:  Chest x-ray from earlier in the same day. CT from 12/30/2020  FINDINGS: Cardiovascular: Atherosclerotic calcifications of the thoracic aorta are noted. No aneurysmal dilatation or dissection is noted. The pulmonary artery shows a normal branching pattern bilaterally. No intraluminal filling defect to suggest pulmonary embolism is noted. No cardiac enlargement is noted. Mild coronary calcifications are seen. Mediastinum/Nodes: Thoracic inlet is within normal limits. No hilar or mediastinal adenopathy is noted. The esophagus as visualized is within normal limits. Lungs/Pleura: Mild atelectatic changes are noted in the left base. The lungs are otherwise well aerated with mild emphysematous change. Multiple nodular changes are identified throughout both lungs. Dominant index nodule on the right is seen in the lower lobe measures 6 mm, best seen on image number 53 of series 6. Additionally some Peri fissural nodules are seen likely representing small lymph nodes. Upper Abdomen: Gallbladder has been surgically removed. No acute upper abdominal abnormality is noted. Musculoskeletal: Degenerative change of the thoracic spine is seen. No acute rib abnormality is noted. Review of the MIP images confirms the above findings. IMPRESSION: No evidence of pulmonary emboli. Multiple pulmonary nodules. Most significant: Right solid pulmonary nodule measuring 6 mm. Per Fleischner Society Guidelines, recommend a non-contrast Chest CT at 3-6 months, then consider another non-contrast Chest CT at 18-24 months. If patient is low risk for malignancy, non-contrast Chest CT at 18-24 months is optional. These guidelines do not apply to immunocompromised patients and patients with cancer. Follow up in patients with significant comorbidities as clinically warranted. For lung cancer screening, adhere to Lung-RADS guidelines. Reference: Radiology. 2017; 284(1):228-43. Aortic Atherosclerosis (ICD10-I70.0) and Emphysema (ICD10-J43.9). Electronically Signed   By: Oneil Devonshire M.D.   On: 11/17/2023 20:18   CT  Head Wo Contrast Result Date: 11/17/2023 EXAM: CT HEAD WITHOUT CONTRAST 11/17/2023 07:21:58 PM TECHNIQUE: CT of the head was performed without the administration of intravenous contrast. Automated exposure control, iterative reconstruction, and/or weight based adjustment of the mA/kV was utilized to reduce the radiation dose to as low as reasonably achievable. COMPARISON: Head CT 03/04/2023 and MRI 01/23/2023. CLINICAL HISTORY: Mental status change, unknown cause. Per [facility] pt coming from home- husband called out stating patient is not as on top of things as usual. Diagnosed with UTI Friday and started  on keflex . Hx of stroke and ems states no new weakness then baseline. FINDINGS: BRAIN AND VENTRICLES: There is a large chronic inferior/posterior right MCA infarct with development of encephalomalacia since the prior CT. There is a small amount of associated mineralization, and there is ex vacuo dilatation of the temporal horn of the right lateral ventricle. There is also a chronic infarct involving the right basal ganglia and internal capsule. Confluent hyperdensities elsewhere in the cerebral white matter bilaterally are nonspecific, compatible with severe chronic small vessel ischemic disease. There is mild global cerebral atrophy. No acute infarct, intracranial hemorrhage, mass, midline shift, hydrocephalus, or extra-axial fluid collection is identified. ORBITS: Bilateral cataract extraction. SINUSES: Chronic left sphenoid sinusitis. Clear mastoid air cells. SOFT TISSUES AND SKULL: Calcified atherosclerosis at the skull base. No hyperdense vessel. No acute soft tissue abnormality. No skull fracture. IMPRESSION: 1. No acute intracranial abnormality. 2. Large chronic right MCA infarct. 3. Severe chronic small vessel ischemic disease. Electronically signed by: Dasie Hamburg MD 11/17/2023 07:36 PM EDT RP Workstation: [id number]   DG Chest Portable 1 View Result Date: 11/17/2023 CLINICAL DATA:  Altered  mental status. Hypoxia. Urinary tract infection. EXAM: PORTABLE CHEST 1 VIEW COMPARISON:  03/04/2023 FINDINGS: The patient's chin overhangs the right lung apex. Stable prominent epicardial fat pad accounting for the density along the cardiac apex which is not substantially changed from 03/04/2023. Atherosclerotic calcification of the aortic arch. The lungs appear otherwise clear. IMPRESSION: 1. No acute findings. 2.  Aortic Atherosclerosis (ICD10-I70.0). Electronically Signed   By: Elfreda Blanchet Salvage M.D.   On: 11/17/2023 14:19     Procedures   Medications Ordered in the ED  ipratropium-albuterol  (DUONEB) 0.5-2.5 (3) MG/3ML nebulizer solution 3 mL (3 mLs Nebulization Given 11/17/23 1355)  lactated ringers  bolus 500 mL (0 mLs Intravenous Stopped 11/17/23 1714)  iohexol  (OMNIPAQUE ) 350 MG/ML injection 75 mL (75 mLs Intravenous Contrast Given 11/17/23 1910)                                    Medical Decision Making Amount and/or Complexity of Data Reviewed Labs: ordered. Radiology: ordered.  Risk Prescription drug management.   This patient presents to the ED for concern of generalized weakness, this involves an extensive number of treatment options, and is a complaint that carries with it a high risk of complications and morbidity.  The differential diagnosis includes conditioning, dehydration, anemia, metabolic derangements, infection   Co morbidities / Chronic conditions that complicate the patient evaluation  GERD, COPD, anxiety, CHF, CVA, HTN, CAD   Additional history obtained:  Additional history obtained from EMR External records from outside source obtained and reviewed including patient's husband   Lab Tests:  I Ordered, and personally interpreted labs.  The pertinent results include: Hemoglobin, no leukocytosis, baseline creatinine, normal electrolytes   Imaging Studies ordered:  I ordered imaging studies including chest x-ray, CT head, CTA chest I independently  visualized and interpreted imaging which showed no acute findings I agree with the radiologist interpretation   Cardiac Monitoring: / EKG:  The patient was maintained on a cardiac monitor.  I personally viewed and interpreted the cardiac monitored which showed an underlying rhythm of: Sinus rhythm   Problem List / ED Course / Critical interventions / Medication management  Patient presents for generalized weakness.  Currently on day 3 of antibiotics for empiric treatment of UTI based on recent dysuria.  History is provided primarily by husband.  Vital signs on arrival are notable for widened pulse pressure, hypoxia.  She is not on oxygen at baseline.  She is placed on 2 L of supplemental oxygen.  She does have history of COPD and CHF.  On auscultation, she does have diminished breath sounds.  Patient denies any current areas of discomfort.  Workup was initiated.  Following breathing treatment, patient was taken off of supplemental oxygen.  She maintained normal SpO2 throughout the remainder of her stay in the ED.  Her husband states that she does have daily inhalers in addition to rescue inhaler with spacer at home.  She was advised to utilize this as needed for shortness of breath or fatigue.  Her lab work was unremarkable.  Notably, urinalysis did not show evidence of infection.  This may be due to effectiveness of her current antibiotic.  She was advised to complete her antibiotic course.  CTA of chest did not show any acute findings.  It did show incidental finding of pulmonary nodules.  Patient was informed of this and advised to get repeat outpatient CT scan in 3 to 6 months.  She was discharged in stable condition. I ordered medication including IV fluids for hydration, DuoNeb for hypoxia Reevaluation of the patient after these medicines showed that the patient improved I have reviewed the patients home medicines and have made adjustments as needed  Social Determinants of Health:  Lives at  home with husband, nonambulatory at baseline.     Final diagnoses:  Generalized weakness    ED Discharge Orders     None          Melvenia Motto, MD 11/17/23 2055

## 2023-11-19 ENCOUNTER — Other Ambulatory Visit (HOSPITAL_BASED_OUTPATIENT_CLINIC_OR_DEPARTMENT_OTHER): Payer: Self-pay | Admitting: Pulmonary Disease

## 2023-11-19 LAB — URINE CULTURE: Culture: 20000 — AB

## 2023-11-19 MED ORDER — BREZTRI AEROSPHERE 160-9-4.8 MCG/ACT IN AERO: 2.0000 | INHALATION_SPRAY | Freq: Two times a day (BID) | RESPIRATORY_TRACT | 11 refills | Status: AC

## 2023-11-19 NOTE — Telephone Encounter (Signed)
 FYI Only or Action Required?: Action required by provider: medication refill request.  Patient is followed in Pulmonology for Bronchiectasis, last seen on 11/07/2022 by Kassie Acquanetta Bradley, MD.

## 2023-11-19 NOTE — Telephone Encounter (Signed)
 Copied from CRM #[id number]. Topic: Clinical - Medication Refill >> Nov 19, 2023  1:11 PM Russell PARAS wrote: Medication: Budeson-Glycopyrrol-Formoterol  (BREZTRI  AEROSPHERE) 160-9-4.8 MCG/ACT AERO   Has the patient contacted their pharmacy? Yes (Agent: If no, request that the patient contact the pharmacy for the refill. If patient does not wish to contact the pharmacy document the reason why and proceed with request.) (Agent: If yes, when and what did the pharmacy advise?)  This is the patient's preferred pharmacy:  [facility] [place], [place] - [id number] [place] [address] Phone: [phone] Fax: [phone]  Is this the correct pharmacy for this prescription? Yes If no, delete pharmacy and type the correct one.   Has the prescription been filled recently? Yes  Is the patient out of the medication? Yes  Has the patient been seen for an appointment in the last year OR does the patient have an upcoming appointment? Yes  Can we respond through MyChart? Yes  Agent: Please be advised that Rx refills may take up to 3 business days. We ask that you follow-up with your pharmacy.

## 2023-12-08 ENCOUNTER — Other Ambulatory Visit: Payer: Self-pay | Admitting: Psychiatry

## 2023-12-08 DIAGNOSIS — F331 Major depressive disorder, recurrent, moderate: Secondary | ICD-10-CM

## 2023-12-08 DIAGNOSIS — F4310 Post-traumatic stress disorder, unspecified: Secondary | ICD-10-CM

## 2023-12-16 ENCOUNTER — Other Ambulatory Visit: Payer: Self-pay

## 2023-12-16 DIAGNOSIS — F331 Major depressive disorder, recurrent, moderate: Secondary | ICD-10-CM

## 2023-12-16 MED ORDER — LITHIUM CARBONATE 150 MG PO CAPS: 150.0000 mg | ORAL_CAPSULE | Freq: Every day | ORAL | 5 refills | Status: DC

## 2024-01-02 DIAGNOSIS — I5020 Unspecified systolic (congestive) heart failure: Secondary | ICD-10-CM | POA: Diagnosis not present

## 2024-01-02 DIAGNOSIS — Z8673 Personal history of transient ischemic attack (TIA), and cerebral infarction without residual deficits: Secondary | ICD-10-CM | POA: Diagnosis not present

## 2024-01-02 DIAGNOSIS — J449 Chronic obstructive pulmonary disease, unspecified: Secondary | ICD-10-CM | POA: Diagnosis not present

## 2024-01-02 DIAGNOSIS — Z993 Dependence on wheelchair: Secondary | ICD-10-CM | POA: Diagnosis not present

## 2024-01-22 ENCOUNTER — Telehealth: Payer: Self-pay | Admitting: Neurology

## 2024-01-22 ENCOUNTER — Ambulatory Visit: Payer: HMO | Admitting: Neurology

## 2024-01-22 NOTE — Telephone Encounter (Signed)
 Pt's daughter called to report pt took sick in the car on the way to the appointment, daughter will call back and r/s.

## 2024-02-02 DIAGNOSIS — Z8673 Personal history of transient ischemic attack (TIA), and cerebral infarction without residual deficits: Secondary | ICD-10-CM | POA: Diagnosis not present

## 2024-02-02 DIAGNOSIS — Z993 Dependence on wheelchair: Secondary | ICD-10-CM | POA: Diagnosis not present

## 2024-02-02 DIAGNOSIS — J449 Chronic obstructive pulmonary disease, unspecified: Secondary | ICD-10-CM | POA: Diagnosis not present

## 2024-02-02 DIAGNOSIS — I5020 Unspecified systolic (congestive) heart failure: Secondary | ICD-10-CM | POA: Diagnosis not present

## 2024-03-15 ENCOUNTER — Emergency Department (HOSPITAL_COMMUNITY): Admission: EM | Admit: 2024-03-15 | Discharge: 2024-03-15 | Disposition: A | Discharge status: Home / Self Care

## 2024-03-15 ENCOUNTER — Emergency Department (HOSPITAL_COMMUNITY)

## 2024-03-15 ENCOUNTER — Other Ambulatory Visit: Payer: Self-pay

## 2024-03-15 ENCOUNTER — Encounter (HOSPITAL_COMMUNITY): Payer: Self-pay | Admitting: *Deleted

## 2024-03-15 DIAGNOSIS — R0602 Shortness of breath: Secondary | ICD-10-CM | POA: Diagnosis not present

## 2024-03-15 DIAGNOSIS — R4182 Altered mental status, unspecified: Secondary | ICD-10-CM

## 2024-03-15 DIAGNOSIS — E860 Dehydration: Secondary | ICD-10-CM | POA: Insufficient documentation

## 2024-03-15 DIAGNOSIS — Z7982 Long term (current) use of aspirin: Secondary | ICD-10-CM | POA: Insufficient documentation

## 2024-03-15 LAB — CBC WITH DIFFERENTIAL/PLATELET
Abs Immature Granulocytes: 0.01 K/uL (ref 0.00–0.07)
Basophils Absolute: 0.1 K/uL (ref 0.0–0.1)
Basophils Relative: 1 %
Eosinophils Absolute: 0.2 K/uL (ref 0.0–0.5)
Eosinophils Relative: 4 %
HCT: 43.9 % (ref 36.0–46.0)
Hemoglobin: 14.2 g/dL (ref 12.0–15.0)
Immature Granulocytes: 0 %
Lymphocytes Relative: 25 %
Lymphs Abs: 1.7 K/uL (ref 0.7–4.0)
MCH: 29.9 pg (ref 26.0–34.0)
MCHC: 32.3 g/dL (ref 30.0–36.0)
MCV: 92.4 fL (ref 80.0–100.0)
Monocytes Absolute: 0.6 K/uL (ref 0.1–1.0)
Monocytes Relative: 10 %
Neutro Abs: 4 K/uL (ref 1.7–7.7)
Neutrophils Relative %: 60 %
Platelets: 211 K/uL (ref 150–400)
RBC: 4.75 MIL/uL (ref 3.87–5.11)
RDW: 12.6 % (ref 11.5–15.5)
WBC: 6.6 K/uL (ref 4.0–10.5)
nRBC: 0 % (ref 0.0–0.2)

## 2024-03-15 LAB — BASIC METABOLIC PANEL WITH GFR
Anion gap: 14 (ref 5–15)
BUN: 14 mg/dL (ref 8–23)
CO2: 24 mmol/L (ref 22–32)
Calcium: 9.7 mg/dL (ref 8.9–10.3)
Chloride: 107 mmol/L (ref 98–111)
Creatinine, Ser: 1.11 mg/dL — ABNORMAL HIGH (ref 0.44–1.00)
GFR, Estimated: 51 mL/min — ABNORMAL LOW
Glucose, Bld: 117 mg/dL — ABNORMAL HIGH (ref 70–99)
Potassium: 3.5 mmol/L (ref 3.5–5.1)
Sodium: 144 mmol/L (ref 135–145)

## 2024-03-15 LAB — URINALYSIS, W/ REFLEX TO CULTURE (INFECTION SUSPECTED)
Bacteria, UA: NONE SEEN
Bilirubin Urine: NEGATIVE
Glucose, UA: >=500 mg/dL — AB
Hgb urine dipstick: NEGATIVE
Ketones, ur: NEGATIVE mg/dL
Leukocytes,Ua: NEGATIVE
Nitrite: NEGATIVE
Protein, ur: NEGATIVE mg/dL
Specific Gravity, Urine: 1.01 (ref 1.005–1.030)
pH: 6 (ref 5.0–8.0)

## 2024-03-15 LAB — RESP PANEL BY RT-PCR (RSV, FLU A&B, COVID)  RVPGX2
Influenza A by PCR: NEGATIVE
Influenza B by PCR: NEGATIVE
Resp Syncytial Virus by PCR: NEGATIVE
SARS Coronavirus 2 by RT PCR: NEGATIVE

## 2024-03-15 LAB — TROPONIN T, HIGH SENSITIVITY
Troponin T High Sensitivity: 17 ng/L (ref 0–19)
Troponin T High Sensitivity: 15 ng/L (ref 0–19)

## 2024-03-15 MED ORDER — LACTATED RINGERS IV BOLUS
500.0000 mL | Freq: Once | INTRAVENOUS | Status: AC
  Administered 2024-03-15: 500 mL via INTRAVENOUS

## 2024-03-15 NOTE — ED Notes (Addendum)
 Melinda Gomez

## 2024-03-15 NOTE — ED Notes (Signed)
 Daughter arrived at bedside and Pt began talking and answering questions appropriately.

## 2024-03-15 NOTE — ED Triage Notes (Signed)
 Pt BIB [facility] from home for c/o AMS; husband reported to ems that he helped pt to bed last night around 8pm and this am when he went to get pt up pt was not speaking or responding; pt is alert but non-verbal and does not follow commands  O2 sats with ems initially were 90% and ems placed pt on O2 at 2L [place] and increased to 97%

## 2024-03-15 NOTE — ED Provider Notes (Signed)
 " [place] EMERGENCY DEPARTMENT AT [facility] Provider Note   CSN: [id number] Arrival date & time: 03/15/24  9078     Patient presents with: Altered Mental Status   Melinda Gomez is a 78 y.o. female.   78 year old female presents for evaluation of mental status.  Patient is nonverbal today.  She does not yes or no and does admit to being fatigued and weaker than usual.  States that started today.  Per EMS report patient is coming from a nursing facility.  Last known normal was sometime last night.  Further history limited at this time.   Altered Mental Status      Prior to Admission medications  Medication Sig Start Date End Date Taking? Authorizing Provider  albuterol  (VENTOLIN  HFA) 108 (90 Base) MCG/ACT inhaler Inhale 2 puffs into the lungs every 4 (four) hours as needed for shortness of breath or wheezing. 10/03/22   Pearlean Manus, MD  ALPRAZolam  (XANAX ) 0.5 MG tablet Take 1 tablet (0.5 mg total) by mouth 3 (three) times daily as needed for anxiety. 08/19/23   Cottle, Lorene KANDICE Raddle., MD  aspirin  EC 81 MG tablet Take 1 tablet (81 mg total) by mouth daily. Swallow whole. 01/31/23   Ricky Fines, MD  budesonide-glycopyrrolate-formoterol  (BREZTRI  AEROSPHERE) 160-9-4.8 MCG/ACT AERO inhaler Inhale 2 puffs into the lungs in the morning and at bedtime. 11/19/23   Kassie Acquanetta Bradley, MD  cetirizine (ZYRTEC) 10 MG tablet Take 10 mg by mouth daily.    [provider]  cholestyramine  (QUESTRAN ) 4 GM/DOSE powder Take 4 g by mouth See admin instructions. 13 grams added to 2-6 oz of water as needed    [provider]  Cyanocobalamin  (B-12) 1000 MCG TABS Take 1,000 mcg by mouth daily in the afternoon.    [provider]  cyclobenzaprine (FLEXERIL) 10 MG tablet Take 10 mg by mouth daily as needed for muscle spasms.    [provider]  dapagliflozin  propanediol (FARXIGA ) 10 MG TABS tablet Take 1 tablet (10 mg total) by mouth daily before breakfast.  09/01/23   O'Neal, Darryle Ned, MD  furosemide  (LASIX ) 20 MG tablet Take 1 tablet (20mg ) as needed for weight gain greater than 3lb overnight or 5lb in 1 week. 03/25/23   O'NealDarryle Ned, MD  lithium  carbonate 150 MG capsule Take 1 capsule (150 mg total) by mouth daily. 12/16/23   Cottle, Lorene KANDICE Raddle., MD  metoprolol  succinate (TOPROL -XL) 25 MG 24 hr tablet Take 1 tablet (25 mg total) by mouth daily. 03/25/23   O'NealDarryle Ned, MD  promethazine (PHENERGAN) 12.5 MG tablet Take 12.5 mg by mouth every 6 (six) hours as needed for nausea.    [provider]  risperiDONE  (RISPERDAL ) 1 MG tablet TAKE ONE TABLET BY MOUTH AT BEDTIME 11/14/23   Cottle, Lorene KANDICE Raddle., MD  rizatriptan (MAXALT) 5 MG tablet Take 5 mg by mouth as needed for migraine. 09/24/22   [provider]  rosuvastatin  (CRESTOR ) 20 MG tablet Take 20 mg by mouth daily.    [provider]  sacubitril -valsartan  (ENTRESTO ) 24-26 MG Take 1 tablet by mouth 2 (two) times daily. 10/24/22   Barbaraann Darryle Ned, MD  spironolactone  (ALDACTONE ) 25 MG tablet TAKE 1/2 TABLET BY MOUTH DAILY FOR FLUID. 06/23/23   O'Neal, Darryle Ned, MD  traZODone  (DESYREL ) 100 MG tablet Take 1.5 tablets (150 mg total) by mouth at bedtime. 07/15/23   Cottle, Lorene KANDICE Raddle., MD  venlafaxine  XR (EFFEXOR -XR) 75 MG 24 hr capsule  TAKE 1 CAPSULE DURING THE DAY AND 2 CAPSULES AT BEDTIME FOR DEPRESSION. 12/08/23   Cottle, Lorene KANDICE Raddle., MD    Allergies: Hydrochlorothiazide, Lisinopril, Norpramin [desipramine], Other, Talwin [pentazocine], and Verapamil     Review of Systems  Reason unable to perform ROS: Altered mental status.    Updated Vital Signs BP (!) 168/67   Pulse 66   Temp 98.6 F (37 C)   Resp (!) 23   Ht 5' 2 (1.575 m)   Wt 70.2 kg   SpO2 99%   BMI 28.31 kg/m   Physical Exam Vitals and nursing note reviewed.  Constitutional:      General: She is not in acute distress.    Appearance: She is well-developed.     Comments:  Chronically ill-appearing  HENT:     Head: Normocephalic and atraumatic.     Mouth/Throat:     Mouth: Mucous membranes are dry.  Eyes:     Conjunctiva/sclera: Conjunctivae normal.  Cardiovascular:     Rate and Rhythm: Normal rate and regular rhythm.     Heart sounds: No murmur heard. Pulmonary:     Effort: Pulmonary effort is normal. No respiratory distress.     Breath sounds: Normal breath sounds.  Abdominal:     Palpations: Abdomen is soft.     Tenderness: There is no abdominal tenderness.  Musculoskeletal:        General: No swelling.     Cervical back: Neck supple.  Skin:    General: Skin is warm and dry.     Capillary Refill: Capillary refill takes less than 2 seconds.  Neurological:     Mental Status: She is alert.     Comments: Patient is nonverbal but does nod and shake her head yes and no, seems globally weak, but strength is equal.  Does follow commands  Psychiatric:        Mood and Affect: Mood normal.     (all labs ordered are listed, but only abnormal results are displayed) Labs Reviewed  BASIC METABOLIC PANEL WITH GFR - Abnormal; Notable for the following components:      Result Value   Glucose, Bld 117 (*)    Creatinine, Ser 1.11 (*)    GFR, Estimated 51 (*)    All other components within normal limits  URINALYSIS, W/ REFLEX TO CULTURE (INFECTION SUSPECTED) - Abnormal; Notable for the following components:   Glucose, UA >=500 (*)    All other components within normal limits  RESP PANEL BY RT-PCR (RSV, FLU A&B, COVID)  RVPGX2  CBC WITH DIFFERENTIAL/PLATELET  TROPONIN T, HIGH SENSITIVITY  TROPONIN T, HIGH SENSITIVITY    EKG: EKG Interpretation Date/Time:  Monday March 15 2024 09:31:11 EST Ventricular Rate:  80 PR Interval:  213 QRS Duration:  154 QT Interval:  552 QTC Calculation: 583 R Axis:   183  Text Interpretation: Right and left arm electrode reversal, interpretation assumes no reversal Sinus rhythm Ventricular premature complex  Borderline prolonged PR interval Nonspecific intraventricular conduction delay Compared with prior EKG from8/25/2025 Confirmed by Gennaro Bouchard ([id number]) on 03/15/2024 9:48:25 AM  Radiology: CT Head Wo Contrast Result Date: 03/15/2024 EXAM: CT HEAD WITHOUT CONTRAST 03/15/2024 11:07:46 AM TECHNIQUE: CT of the head was performed without the administration of intravenous contrast. Automated exposure control, iterative reconstruction, and/or weight based adjustment of the mA/kV was utilized to reduce the radiation dose to as low as reasonably achievable. COMPARISON: 11/17/2023 CLINICAL HISTORY: ams FINDINGS: BRAIN AND VENTRICLES: No acute hemorrhage. No evidence of acute  infarct. No hydrocephalus. No extra-axial collection. No mass effect or midline shift. Unchanged Large chronic right MCA distribution infarct with cortical laminar necrosis. Ex vacuo dilatation of right lateral ventricle related to large chronic right MCA distribution infarct. Advanced chronic small vessel ischemic change. Scattered vascular calcifications. ORBITS: No acute abnormality. SINUSES: Chronic left sphenoid sinusitis. SOFT TISSUES AND SKULL: No acute soft tissue abnormality. No skull fracture. IMPRESSION: 1. No acute intracranial abnormality. 2. Unchanged large chronic right MCA distribution infarct with cortical laminar necrosis and ex vacuo dilatation of the right lateral ventricle. 3. Advanced chronic small vessel ischemic change. Electronically signed by: Ryan Chess MD 03/15/2024 11:12 AM EST RP Workstation: [id number]   DG Chest 1 View Result Date: 03/15/2024 CLINICAL DATA:  Cough and shortness of breath. Altered mental status. EXAM: CHEST  1 VIEW COMPARISON:  11/17/2023. FINDINGS: The heart is enlarged and the mediastinal contour is within normal limits. Prominent epicardial fat pad is unchanged. There is atherosclerotic calcification of the aorta. Lung volumes are low. No consolidation, effusion, or pneumothorax is seen.  Surgical clips are present in the right upper quadrant. No acute osseous abnormality. IMPRESSION: No active disease. Electronically Signed   By: Leita Birmingham M.D.   On: 03/15/2024 10:40     Procedures   Medications Ordered in the ED  lactated ringers  bolus 500 mL (0 mLs Intravenous Stopped 03/15/24 1224)                                    Medical Decision Making Cardiac monitor interpretation: Sinus rhythm, no ectopy  Patient here for general weakness and altered mental status that started this morning.  Vitals are stable.  Workup largely negative including negative imaging.  On arrival she was responsive, but nonverbal but did nod yes and no to questions appropriately and follow commands appropriately.  Was given 500 cc of IV fluids and seems much improved.  Family at bedside seems to think she is back to her baseline and patient is now more talkative and states she is feeling much better.  Advise close follow-up with primary care, stay on her medications as prescribed and otherwise return to the ER for new or worsening symptoms.  All results and plan discussed with patient and family at bedside they are agreeable with plan for discharge home.  Problems Addressed: Altered mental status, unspecified altered mental status type: acute illness or injury Dehydration: acute illness or injury  Amount and/or Complexity of Data Reviewed Independent Historian: caregiver and spouse    Details: Spouse and daughter at bedside helps provide history.  They state home health nurse came to the house and patient was weaker than usual and not responding.  Husband states that this was the case as well so he called 911 External Data Reviewed: notes.    Details: Prior ED records reviewed and patient seen 8 - 25 - 25 for generalized weakness Labs: ordered. Decision-making details documented in ED Course.    Details: Ordered and reviewed by me and unremarkable Radiology: ordered and independent interpretation  performed. Decision-making details documented in ED Course.    Details: Ordered and interpreted by me independently of radiology CT head: Shows no acute abnormality Chest x-ray: Shows no acute abnormality ECG/medicine tests: ordered and independent interpretation performed. Decision-making details documented in ED Course.    Details: Ordered and interpreted by me in the absence of cardiology and shows sinus rhythm, no STEMI, or significant  change when compared to prior EKG  Risk OTC drugs. Prescription drug management. Drug therapy requiring intensive monitoring for toxicity.    Final diagnoses:  Altered mental status, unspecified altered mental status type  Dehydration    ED Discharge Orders     None          Gennaro Duwaine CROME, DO 03/15/24 1500  "

## 2024-03-15 NOTE — Discharge Instructions (Addendum)
 Drink lots of fluids and try to increase your water intake.  Eat a balanced diet.  Call and follow-up with your primary care doctor in 1 to 2 weeks.  Return to the ER for any new or worsening symptoms.

## 2024-03-16 ENCOUNTER — Telehealth: Payer: Self-pay | Admitting: Psychiatry

## 2024-03-16 NOTE — Telephone Encounter (Signed)
 Patient's daughter called in stating that [place] is on Trazedone 100mg  1.5 a day. She was told to call is she needed to go up to two a day. She would like to go up to two a day as the 1.5 a day isn't helping to keep her asleeep. PH: [phone] [facility] [address] [place], [place]

## 2024-03-22 NOTE — Telephone Encounter (Signed)
 Pt currently taking 150 mg of trazodone . Dtr said she had hoped this would be enough, but reporting per pt's H that patient is not sleeping thru the night. Dtr called, but said she doesn't know how many hours a night patient is sleeping, as she doesn't live with patient. Dtr said you would increase to 200 mg if needed and she is now asking for that.

## 2024-03-22 NOTE — Telephone Encounter (Signed)
 Please call to schedule, last seen in April with RTC in 9 mo.

## 2024-03-23 ENCOUNTER — Other Ambulatory Visit: Payer: Self-pay | Admitting: Cardiovascular Disease

## 2024-03-23 NOTE — Telephone Encounter (Signed)
 Ok to increase trazodone  to 200 mg nightly.  If needed, only send 30 day supply in case it has to be changed at appt

## 2024-03-23 NOTE — Telephone Encounter (Signed)
 PT is scheduled for  04/01/24

## 2024-03-24 NOTE — Telephone Encounter (Signed)
 Reviewed recommendation with dtr. She said she will check on medication and let me know if she has enough to get to appt next week or she needs a RF.

## 2024-04-01 ENCOUNTER — Telehealth: Admitting: Psychiatry

## 2024-04-01 ENCOUNTER — Telehealth: Payer: Self-pay | Admitting: Psychiatry

## 2024-04-01 ENCOUNTER — Encounter: Payer: Self-pay | Admitting: Psychiatry

## 2024-04-01 DIAGNOSIS — F22 Delusional disorders: Secondary | ICD-10-CM | POA: Diagnosis not present

## 2024-04-01 DIAGNOSIS — F5105 Insomnia due to other mental disorder: Secondary | ICD-10-CM

## 2024-04-01 DIAGNOSIS — G4720 Circadian rhythm sleep disorder, unspecified type: Secondary | ICD-10-CM

## 2024-04-01 DIAGNOSIS — F331 Major depressive disorder, recurrent, moderate: Secondary | ICD-10-CM | POA: Diagnosis not present

## 2024-04-01 DIAGNOSIS — F4310 Post-traumatic stress disorder, unspecified: Secondary | ICD-10-CM

## 2024-04-01 MED ORDER — ALPRAZOLAM 0.5 MG PO TABS: 0.5000 mg | ORAL_TABLET | Freq: Three times a day (TID) | ORAL | 2 refills | Status: AC | PRN

## 2024-04-01 MED ORDER — TRAZODONE HCL 100 MG PO TABS: 200.0000 mg | ORAL_TABLET | Freq: Every day | ORAL | 1 refills | Status: AC

## 2024-04-01 MED ORDER — RISPERIDONE 1 MG PO TABS: 1.0000 mg | ORAL_TABLET | Freq: Every day | ORAL | 1 refills | Status: AC

## 2024-04-01 MED ORDER — VENLAFAXINE HCL ER 75 MG PO CP24: ORAL_CAPSULE | ORAL | 1 refills | Status: AC

## 2024-04-01 MED ORDER — LITHIUM CARBONATE 150 MG PO CAPS: 150.0000 mg | ORAL_CAPSULE | Freq: Every day | ORAL | 1 refills | Status: AC

## 2024-04-01 NOTE — Telephone Encounter (Signed)
 noted

## 2024-04-01 NOTE — Telephone Encounter (Signed)
 I called pt for follow up appt.   Spoke to daughter, Eleanor who is [place].  She said they forgot to tell you in the appt that pt thinks she is pregnant.  And she keeps telling Eleanor and her sister that she's expecting a boy.  She said it's been happening every since she had her stroke and she just wanted you to be aware of it as an FYI.  Next appt 5/11

## 2024-04-01 NOTE — Progress Notes (Signed)
 Melinda Gomez [id number] 1946-01-05 79 y.o.  Virtual Visit via Telephone Note  I connected with pt by telephone and verified that I am speaking with the correct person using two identifiers.   I discussed the limitations, risks, security and privacy concerns of performing an evaluation and management service by telephone and the availability of in person appointments. I also discussed with the patient that there may be a patient responsible charge related to this service. The patient expressed understanding and agreed to proceed.  I discussed the assessment and treatment plan with the patient. The patient was provided an opportunity to ask questions and all were answered. The patient agreed with the plan and demonstrated an understanding of the instructions.   The patient was advised to call back or seek an in-person evaluation if the symptoms worsen or if the condition fails to improve as anticipated.  I provided 30 minutes of non-face-to-face time during this encounter. The patient was located at with daughter and the provider was located office. [phone]  Session [id number]   Subjective:   Patient ID:  Melinda Gomez is a 79 y.o. (DOB Apr 02, 1945) female.  Chief Complaint:  Chief Complaint  Patient presents with   Follow-up   Depression   Anxiety   Sleeping Problem   Fatigue   Melinda Gomez is followed up for chronic depression and anxiety and insomnia.  visit was July 15, 2018.  She was still dealing with chronic insomnia.  We discussed potential med changes but none were made per her request.  visit October 2020.  No meds were changed.  \July 15, 2019 appointment, the following is noted: 2 years of not sleeping.  Going to bed 10 pm .  Awakens a lot 4-5 hours sleep.  Denies napping.  Fight staying awake.   Caffeine 3 cups of coffee up to 5 pm.  Has to have it when removes dentures.  Drowsy daytime. Not anxious nor depressed.  Occ too irritable but not in mos except  yesterday.  Would like to sleep longer  No naps usually.  At night takes trazodone  150, hydroxyzine 25, no alprazolam  at night.  Calm and Melinda Gomez agrees.  Overall is quite well. No SE meds.  Usual Xanax  prn anxiety not set time.  Not taken daily. Plan: Switch 5 PM coffee to decaf. Increase trazodone  to 200 mg HS.    01/13/2020 appointment with the following noted: Sleep is better with 7-8 hours. Cut PM caffeine.   Overall mood good except for a couple of weeks.  Melinda Gomez did something 25 years ago and that bears on her mind at times but turned it over to God.    06/21/2020 appt today: Taking trazodone  200 mg HS. Good days and bad days.  Taking more alprazolam  than usual TID.  Found out 2 weeks ago that Melinda Gomez has started up with girl he went to college with making her more nervous and upset.  He's aware she knows.  She thinks he's broken off the relationship but this is the third time.  Blames herself.  Can not have sex anymore.  More anxious and angry.  Otherwise mood is fine.  Sleep overall is better.  May nap in the morning.  No SE  01/04/21 appt today: Upset bc Melinda Gomez running around on her and admitted to it.  He's done it in the past.  Says he's doing it to get back at her for her doing it early in their relationship.  Xanax  is helping her cope.  Her F was alcoholic. Some low days but kept it in control.  Proud of herself. No SE and seeing PCP regularly Still smoking and says it helps her stress.  Stress smoker. Otherwise fine without depression. Mood seems fine even when doesn't get enough sleep.  Patient reports stable mood and denies depressed or irritable moods.  Patient denies any recent difficulty with anxiety.  Patient denies difficulty with sleep initiation and maintenance. Denies appetite disturbance.  Eating fine.  Patient reports that energy and motivation have been good.  Patient denies any difficulty with concentration.  Patient denies any suicidal ideation. Plan: Continue trazodone  to 50  mg HS bc says more eoesn't help.   Option Seroquel for TR insomnia but greater SE risk.  Defer this Risperidone  1 mg HS Continue Effexor  XR 225 mg daily. Continue alprazolam  0.5 mg TID prn.  Don't exceed dose.  07/10/2021 appointment with the following noted CO bad heart and DM with dx systolic HF, LBB. Sleeps a lot but is all night now. Taking 300 mg trazodone  HS.  Can have hangover.   Quit smoking since here.   Appetite good but eating  better. Mood pretty good considering. No panic attacks.  Even in the hospital. Taking Xanax  as needed. Gets frustrated can't do housework anymore physically.  01/05/22 appt noted: Doctor's said lower part of heart is not functioning right.  Feels tired and legs swelling.  Couldn't walk to the office.  Melinda Gomez been a big help Living at home.   D noted pt longterm paranoid tendency thinking Melinda cheating on her and suspicious about meds. Consistent with meds. Anxiety kind of bad this last week.  Can't talk about it, too many ears.  No full panic. Sleep is good.   Yesterday down but not usually.  Talks herself out of it. No SE. No med changes desired. Still Xanax  0.5 mg TID, Taking 200 mg trazodone  HS, venlafaxine  XR 225 mg daily, risperidone  1 mg HS.  No Benadryl   07/15/22 appt noted: Continues psych meds. Need help to get around.   More stresssed with health problems.  Anxiety is affected.  Dep managed. No SE with meds.   D Melinda Gomez and Melinda Gomez helping with meds.   Put on wt and up to 160#.   Denies sleepiness after Xnaax.  No panic.   Appetite good. Vocal change without reason.   No anger.  No fear except at her limitations.  No paranoia.    01/14/23 appt noted: Taking alprazolam  twice weekly. Risperidone  1 mg HS, venlafaxine  XR 75 mg 1 AM and 2 PM, trazodone  200 mg HS. Overall pretty good but some bad days.  Can't function as well physically as she would like.   On heart meds for CVD.  Heart is better lately.  Hosp July for CHF. Breathing is  good now unless anxious then SOB.  Not as anxious since on heart meds.   On the same psych meds for years.   Sleep is good with meds.  Afraid not to take sleep meds.   No agitiation or anger outbursts.  No fearful except over her heart but better now.   No SE with meds.   Doesn't want med changes.   Melinda Gomez is doing fine.   Had episode NV this am but feels better now.   No SE No falls lately.  Using walker DT weakness.  07/15/23 appt noted: Med:  alprazolam  twice weekly. Risperidone  1 mg HS, venlafaxine  XR 75 mg 1 AM and 2  PM, trazodone  200 mg HS. No SE. Doing good.  depression and anxiety are under control.  Sleep pretty good.  No new concerns. PCP discovered that she had low B12.  The level was 186.  Husband reports that her energy is a little better with the addition of B12.  Husband also reports that he and her daughter noticed that she was somewhat lethargic in the morning and so they reduced the trazodone  to 150 mg at night and she seems more alert in the morning and is still sleeping okay. Plan  no changes  08/26/24 TC:  Daughter said pt is making comments that are concerning. She hasn't said she wanted to hurt herself, but mentioned having a dream about committing suicide and previously mentioning she might be better off dead. Dtr reports she has attempted in the past. She doesn't have access to guns. Dtr isn't sure she has a plan. I asked about meds and she said she had access but mobility is limited. Reviewed emergency resources with the dtr.   Was last seen 4/22. Reviewed meds with dtr.  She is taking 200 mg trazodone , but otherwise taking as listed.   Continue trazodone  to 100-150 mg HS but try lower dose.   Risperidone  1 mg HS.     Continue Effexor  XR 225 mg daily. Continue alprazolam  0.5 mg TID prn.    Memory issues since stroke  MD resp:  She has had a stroke and has some cognitive problems and difficulty accessing means to suicide.  There is a family history of suicidality with  benefit from lithium .  Will add lowest dose of lithium  to address the dep with SI. Sent RX lithium  150 mg HS.    Lorene Macintosh, MD, DFAPA        04/01/24 appt noted:  with VEAR Ingle and D Melinda Gomez Med:  alprazolam  0.5 mg typically about one daily. Risperidone  1 mg HS, venlafaxine  XR 75 mg 1 AM and 2 PM, trazodone  150 mg HS. No SE. ER visit DT confusion 03/15/24.  Wasn't responding that day.  ER workup negative. Not confused since then. Feeling good today.  Denies depression.  Taking a lot of anxiety pills.   Melinda says at times she gets confused and since stroke will want to go to her old house.  Since the stroke and not new.   Denies SI and none voiced lately. No SE issues.   More trouble going to sleep and wants to increase trazodone .  No napping during the day. WC confined.   Occ coffee not daily, largely weaned off caffeine.  No unusual irritability.   Melinda asks about sundowning.  That's when she gets confused about being in wrong house.     Past Psychiatric Medication Trials: Trazodone  up to 150, alprazolam  0.5 TID, hydroxyzine, mirtazapine 7.5 jerks, Belsomra hangover. risperidone , buspirone, venlafaxine ,  Paxil 60 to 80 mg, Wellbutrin side effects, Prozac,   Review of Systems:  Review of Systems  Constitutional:  Positive for fatigue.  HENT:  Positive for voice change.   Respiratory:  Positive for shortness of breath.   Cardiovascular:  Negative for chest pain and palpitations.  Gastrointestinal:  Negative for diarrhea and vomiting.  Neurological:  Positive for weakness. Negative for tremors.       No recent falls.  Uses walker DT weakness  Psychiatric/Behavioral:  Negative for dysphoric mood and sleep disturbance. The patient is not nervous/anxious.   Has talked to Doctor about falls but no known diagnosis for it.  Not  dizzy before falls. Can't get herself up when she falls.  Medications: I have reviewed the patient's current medications.  Current Outpatient Medications   Medication Sig Dispense Refill   albuterol  (VENTOLIN  HFA) 108 (90 Base) MCG/ACT inhaler Inhale 2 puffs into the lungs every 4 (four) hours as needed for shortness of breath or wheezing. 18 g 2   aspirin  EC 81 MG tablet Take 1 tablet (81 mg total) by mouth daily. Swallow whole.     budesonide-glycopyrrolate-formoterol  (BREZTRI  AEROSPHERE) 160-9-4.8 MCG/ACT AERO inhaler Inhale 2 puffs into the lungs in the morning and at bedtime. 10.7 g 11   cetirizine (ZYRTEC) 10 MG tablet Take 10 mg by mouth daily.     cholestyramine  (QUESTRAN ) 4 GM/DOSE powder Take 4 g by mouth See admin instructions. 13 grams added to 2-6 oz of water as needed     Cyanocobalamin  (B-12) 1000 MCG TABS Take 1,000 mcg by mouth daily in the afternoon.     cyclobenzaprine (FLEXERIL) 10 MG tablet Take 10 mg by mouth daily as needed for muscle spasms.     dapagliflozin  propanediol (FARXIGA ) 10 MG TABS tablet Take 1 tablet (10 mg total) by mouth daily before breakfast. 90 tablet 1   furosemide  (LASIX ) 20 MG tablet Take 1 tablet (20mg ) as needed for weight gain greater than 3lb overnight or 5lb in 1 week. 90 tablet 3   metoprolol  succinate (TOPROL -XL) 25 MG 24 hr tablet Take 1 tablet (25 mg total) by mouth daily. 90 tablet 3   promethazine (PHENERGAN) 12.5 MG tablet Take 12.5 mg by mouth every 6 (six) hours as needed for nausea.     rizatriptan (MAXALT) 5 MG tablet Take 5 mg by mouth as needed for migraine.     rosuvastatin  (CRESTOR ) 20 MG tablet Take 20 mg by mouth daily.     sacubitril -valsartan  (ENTRESTO ) 24-26 MG Take 1 tablet by mouth 2 (two) times daily. 180 tablet 3   spironolactone  (ALDACTONE ) 25 MG tablet TAKE 1/2 TABLET BY MOUTH DAILY FOR FLUID. 45 tablet 2   ALPRAZolam  (XANAX ) 0.5 MG tablet Take 1 tablet (0.5 mg total) by mouth 3 (three) times daily as needed for anxiety. 90 tablet 2   lithium  carbonate 150 MG capsule Take 1 capsule (150 mg total) by mouth daily. 90 capsule 1   risperiDONE  (RISPERDAL ) 1 MG tablet Take 1 tablet  (1 mg total) by mouth at bedtime. 90 tablet 1   traZODone  (DESYREL ) 100 MG tablet Take 2 tablets (200 mg total) by mouth at bedtime. 180 tablet 1   venlafaxine  XR (EFFEXOR -XR) 75 MG 24 hr capsule TAKE 1 CAPSULE DURING THE DAY AND 2 CAPSULES AT BEDTIME FOR DEPRESSION. 90 capsule 1   No current facility-administered medications for this visit.    Medication Side Effects: None  No evidence for falls related to meds.  No pattern to falls. Allergies:  Allergies  Allergen Reactions   Hydrochlorothiazide Other (See Comments)    hyponatremia   Lisinopril Cough   Norpramin [Desipramine] Other (See Comments)    Jittery   Other Other (See Comments)    Estratest  depression & palpitation   Talwin [Pentazocine] Other (See Comments)    hallucinations   Verapamil  Cough    Past Medical History:  Diagnosis Date   CHF (congestive heart failure) (HCC)    COPD (chronic obstructive pulmonary disease) (HCC)    Coronary artery disease    GAD (generalized anxiety disorder)    GERD (gastroesophageal reflux disease)    Hypercholesteremia  Hypertension    Stroke [facility])     Family History  Problem Relation Age of Onset   Stroke Mother     Social History   Socioeconomic History   Marital status: Married    Spouse name: Not on file   Number of children: 2   Years of education: Not on file   Highest education level: Not on file  Occupational History   Occupation: Retired - [employer]  Tobacco Use   Smoking status: Former    Current packs/day: 0.00    Average packs/day: 0.5 packs/day for 55.0 years (27.5 ttl pk-yrs)    Types: Cigarettes    Start date: 02/1966    Quit date: 02/2021    Years since quitting: 3.1   Smokeless tobacco: Never  Substance and Sexual Activity   Alcohol use: Never   Drug use: Never   Sexual activity: Not on file  Other Topics Concern   Not on file  Social History Narrative   Not on file   Social Drivers of Health   Tobacco Use: Medium Risk  (04/01/2024)   Patient History    Smoking Tobacco Use: Former    Smokeless Tobacco Use: Never    Passive Exposure: Not on Actuary Strain: Not on file  Food Insecurity: No Food Insecurity (01/31/2023)   Hunger Vital Sign    Worried About Running Out of Food in the Last Year: Never true    Ran Out of Food in the Last Year: Never true  Transportation Needs: No Transportation Needs (01/31/2023)   PRAPARE - Administrator, Civil Service (Medical): No    Lack of Transportation (Non-Medical): No  Physical Activity: Not on file  Stress: Not on file  Social Connections: Not on file  Intimate Partner Violence: Not At Risk (01/31/2023)   Humiliation, Afraid, Rape, and Kick questionnaire    Fear of Current or Ex-Partner: No    Emotionally Abused: No    Physically Abused: No    Sexually Abused: No  Depression (PHQ2-9): Not on file  Alcohol Screen: Not on file  Housing: Low Risk (01/31/2023)   Housing    Last Housing Risk Score: 0  Utilities: Not At Risk (01/31/2023)   AHC Utilities    Threatened with loss of utilities: No  Health Literacy: Not on file    Past Medical History, Surgical history, Social history, and Family history were reviewed and updated as appropriate.   Please see review of systems for further details on the patient's review from today.   Objective:   Physical Exam:  There were no vitals taken for this visit.  Physical Exam Neurological:     Mental Status: She is alert and oriented to person, place, and time.     Cranial Nerves: No dysarthria.  Psychiatric:        Attention and Perception: Attention and perception normal.        Mood and Affect: Mood is not anxious or depressed.        Speech: Speech normal.        Behavior: Behavior is cooperative.        Thought Content: Thought content normal. Thought content is not paranoid or delusional. Thought content does not include homicidal or suicidal ideation. Thought content does not include  suicidal plan.        Cognition and Memory: Cognition and memory normal.        Judgment: Judgment normal.     Comments:  Insight fair Depression and anxiety are improved     Lab Review:     Component Value Date/Time   NA 144 03/15/2024 1018   NA 138 12/02/2022 1236   K 3.5 03/15/2024 1018   CL 107 03/15/2024 1018   CO2 24 03/15/2024 1018   GLUCOSE 117 (Melinda) 03/15/2024 1018   BUN 14 03/15/2024 1018   BUN 14 12/02/2022 1236   CREATININE 1.11 (Melinda) 03/15/2024 1018   CALCIUM  9.7 03/15/2024 1018   PROT 6.3 (L) 11/17/2023 1429   PROT 6.8 04/18/2022 1600   ALBUMIN 3.7 11/17/2023 1429   ALBUMIN 4.6 04/18/2022 1600   AST 16 11/17/2023 1429   ALT 21 11/17/2023 1429   ALKPHOS 44 11/17/2023 1429   BILITOT 0.6 11/17/2023 1429   BILITOT <0.2 04/18/2022 1600   GFRNONAA 51 (L) 03/15/2024 1018   GFRAA  07/30/2008 0945    >60        The eGFR has been calculated using the MDRD equation. This calculation has not been validated in all clinical situations. eGFR's persistently <60 mL/min signify possible Chronic Kidney Disease.       Component Value Date/Time   WBC 6.6 03/15/2024 1018   RBC 4.75 03/15/2024 1018   HGB 14.2 03/15/2024 1018   HGB 14.7 04/18/2022 1600   HCT 43.9 03/15/2024 1018   HCT 43.9 04/18/2022 1600   PLT 211 03/15/2024 1018   PLT 203 04/18/2022 1600   MCV 92.4 03/15/2024 1018   MCV 89 04/18/2022 1600   MCH 29.9 03/15/2024 1018   MCHC 32.3 03/15/2024 1018   RDW 12.6 03/15/2024 1018   RDW 12.9 04/18/2022 1600   LYMPHSABS 1.7 03/15/2024 1018   MONOABS 0.6 03/15/2024 1018   EOSABS 0.2 03/15/2024 1018   BASOSABS 0.1 03/15/2024 1018    No results found for: POCLITH, LITHIUM    No results found for: PHENYTOIN, PHENOBARB, VALPROATE, CBMZ    Latest Reference Range & Units 05/26/23 10:36  Vitamin B12 232 - 1,245 pg/mL 186 (L)  (L): Data is abnormally low Started B12 1000 mcg daily after this  .res Assessment: Plan:    Insomnia due to mental  condition - Plan: traZODone  (DESYREL ) 100 MG tablet  Disturbed sleep rhythm - Plan: traZODone  (DESYREL ) 100 MG tablet  PTSD (post-traumatic stress disorder) - Plan: ALPRAZolam  (XANAX ) 0.5 MG tablet, risperiDONE  (RISPERDAL ) 1 MG tablet, venlafaxine  XR (EFFEXOR -XR) 75 MG 24 hr capsule  Major depressive disorder, recurrent episode, moderate (HCC) - Plan: lithium  carbonate 150 MG capsule, venlafaxine  XR (EFFEXOR -XR) 75 MG 24 hr capsule  Paranoid disorder (HCC) - Plan: risperiDONE  (RISPERDAL ) 1 MG tablet   We discussed she is more anxious with health problems worsening but handling.  Including heart problems.    Previously Spoke with daughter who is caretaking.  Patient tends to minimize her problems especially her health problems.  She is not motivated to take care of herself.  Spending a lot of time just sitting.  Will not do any chores.  Congestive heart failure is a contributing problem to low energy. Daughter confirms longstanding history of underlying paranoia but which is not highly disruptive.  No anger or acting out episodes lately.  No paranoia elicited.  Lately her sleep has been  better than usual and able to reduce trazodone  250 mg at bedtime and is more alert in the morning with the reduction..  She does appear to be getting a greater quantity of sleep than last visit which is positive for her overall mental health.SABRA  We will avoid benzodiazepines at night for sleep because she has a high risk of tolerance with those.  We discussed the fall risk related to any sedative medication.    Extensive discussion of the importance of normal B12 for good mental health including lessening depression and improving energy and cognition.  Discussed recent study that suggested that even low normal B12 levels could cause symptoms of impaired cognition and could be improved with supplemental B12.  Emphasized compliance to both the patient and her husband.  Disc importance of FU level which they had  planned to avoid.  Disc risk of impaired absorption in some people. She is starting 1000 mcg vitamin B-12 and has seen some benefit already.  Supportive therapy dealing with decline of health and limited ability to do things.  Mostly now decaf.    increease trazodone  to 200 mg HS bc failed lower dose.    Continue Effexor  XR 225 mg daily. Continue alprazolam  0.5 mg TID prn.  Don't exceed dose. She is only taking a couple per week.  Move risperidone  earlier to deal with sundowning. Risperidone  1 mg at dusk .   Helping anxiety and mild paranoia.     No use of Benadryl  now  Overall her depression and anxiety associated with PTSD are fairly managed.  She has a long psychiatric history but in general has been more stable in the last few years except for the chronic insomnia complaints which are better. She has a fairly restricted life which is how in part she manages her anxiety but it does not contribute to depression so she satisfied.  She does not want medicine change today.  Follow-up 4-6 mos  Lorene Macintosh, MD, DFAPA    Please see After Visit Summary for patient specific instructions.  No future appointments.   No orders of the defined types were placed in this encounter.     -------------------------------c

## 2024-04-17 ENCOUNTER — Encounter: Payer: Self-pay | Admitting: Cardiovascular Disease

## 2024-04-19 MED ORDER — SACUBITRIL-VALSARTAN 24-26 MG PO TABS: 1.0000 | ORAL_TABLET | Freq: Two times a day (BID) | ORAL | 0 refills | Status: AC

## 2024-04-23 ENCOUNTER — Encounter: Payer: Self-pay | Admitting: Neurology

## 2024-08-02 ENCOUNTER — Telehealth: Admitting: Psychiatry
